# Patient Record
Sex: Female | Born: 1955 | Race: White | Hispanic: No | Marital: Married | Smoking: Never smoker
Health system: Southern US, Community
[De-identification: ages and names within clinical notes are randomized; demographics above are authoritative.]

## PROBLEM LIST (undated history)

## (undated) DIAGNOSIS — N83201 Unspecified ovarian cyst, right side: Secondary | ICD-10-CM

## (undated) DIAGNOSIS — R079 Chest pain, unspecified: Secondary | ICD-10-CM

## (undated) DIAGNOSIS — T7840XA Allergy, unspecified, initial encounter: Secondary | ICD-10-CM

## (undated) DIAGNOSIS — Z8489 Family history of other specified conditions: Secondary | ICD-10-CM

## (undated) DIAGNOSIS — E785 Hyperlipidemia, unspecified: Secondary | ICD-10-CM

## (undated) DIAGNOSIS — Z973 Presence of spectacles and contact lenses: Secondary | ICD-10-CM

## (undated) DIAGNOSIS — S83249A Other tear of medial meniscus, current injury, unspecified knee, initial encounter: Secondary | ICD-10-CM

## (undated) DIAGNOSIS — K430 Incisional hernia with obstruction, without gangrene: Secondary | ICD-10-CM

## (undated) DIAGNOSIS — R931 Abnormal findings on diagnostic imaging of heart and coronary circulation: Secondary | ICD-10-CM

## (undated) DIAGNOSIS — G4730 Sleep apnea, unspecified: Secondary | ICD-10-CM

## (undated) DIAGNOSIS — F419 Anxiety disorder, unspecified: Secondary | ICD-10-CM

## (undated) DIAGNOSIS — K5730 Diverticulosis of large intestine without perforation or abscess without bleeding: Secondary | ICD-10-CM

## (undated) DIAGNOSIS — Z79899 Other long term (current) drug therapy: Secondary | ICD-10-CM

## (undated) DIAGNOSIS — G4733 Obstructive sleep apnea (adult) (pediatric): Secondary | ICD-10-CM

## (undated) DIAGNOSIS — N6099 Unspecified benign mammary dysplasia of unspecified breast: Secondary | ICD-10-CM

## (undated) DIAGNOSIS — K219 Gastro-esophageal reflux disease without esophagitis: Secondary | ICD-10-CM

## (undated) DIAGNOSIS — I10 Essential (primary) hypertension: Secondary | ICD-10-CM

## (undated) DIAGNOSIS — IMO0002 Reserved for concepts with insufficient information to code with codable children: Secondary | ICD-10-CM

## (undated) DIAGNOSIS — E669 Obesity, unspecified: Secondary | ICD-10-CM

## (undated) DIAGNOSIS — Z9989 Dependence on other enabling machines and devices: Secondary | ICD-10-CM

## (undated) DIAGNOSIS — R0609 Other forms of dyspnea: Secondary | ICD-10-CM

## (undated) DIAGNOSIS — M1990 Unspecified osteoarthritis, unspecified site: Secondary | ICD-10-CM

## (undated) DIAGNOSIS — H9190 Unspecified hearing loss, unspecified ear: Secondary | ICD-10-CM

## (undated) DIAGNOSIS — N6092 Unspecified benign mammary dysplasia of left breast: Secondary | ICD-10-CM

## (undated) DIAGNOSIS — M109 Gout, unspecified: Secondary | ICD-10-CM

## (undated) DIAGNOSIS — R011 Cardiac murmur, unspecified: Secondary | ICD-10-CM

## (undated) DIAGNOSIS — K589 Irritable bowel syndrome without diarrhea: Secondary | ICD-10-CM

## (undated) DIAGNOSIS — E119 Type 2 diabetes mellitus without complications: Secondary | ICD-10-CM

## (undated) DIAGNOSIS — Z9889 Other specified postprocedural states: Secondary | ICD-10-CM

## (undated) DIAGNOSIS — R112 Nausea with vomiting, unspecified: Secondary | ICD-10-CM

## (undated) HISTORY — PX: TRIGGER FINGER RELEASE: SHX641

## (undated) HISTORY — PX: POSTERIOR LUMBAR FUSION: SHX6036

## (undated) HISTORY — DX: Other forms of dyspnea: R06.09

## (undated) HISTORY — DX: Essential (primary) hypertension: I10

## (undated) HISTORY — PX: TONSILLECTOMY: SUR1361

## (undated) HISTORY — DX: Other long term (current) drug therapy: Z79.899

## (undated) HISTORY — DX: Chest pain, unspecified: R07.9

## (undated) HISTORY — DX: Incisional hernia with obstruction, without gangrene: K43.0

## (undated) HISTORY — DX: Unspecified benign mammary dysplasia of left breast: N60.92

## (undated) HISTORY — DX: Hyperlipidemia, unspecified: E78.5

## (undated) HISTORY — DX: Obesity, unspecified: E66.9

## (undated) HISTORY — DX: Unspecified benign mammary dysplasia of unspecified breast: N60.99

## (undated) HISTORY — PX: WISDOM TOOTH EXTRACTION: SHX21

## (undated) HISTORY — DX: Reserved for concepts with insufficient information to code with codable children: IMO0002

## (undated) HISTORY — DX: Unspecified ovarian cyst, right side: N83.201

## (undated) HISTORY — PX: CERVICAL LAMINECTOMY: SHX94

## (undated) HISTORY — DX: Diverticulosis of large intestine without perforation or abscess without bleeding: K57.30

## (undated) HISTORY — DX: Abnormal findings on diagnostic imaging of heart and coronary circulation: R93.1

## (undated) HISTORY — DX: Cardiac murmur, unspecified: R01.1

## (undated) HISTORY — DX: Unspecified hearing loss, unspecified ear: H91.90

## (undated) HISTORY — PX: JOINT REPLACEMENT: SHX530

## (undated) HISTORY — PX: BREAST SURGERY: SHX581

## (undated) HISTORY — PX: ANTERIOR LUMBAR FUSION: SHX1170

## (undated) HISTORY — DX: Dependence on other enabling machines and devices: Z99.89

## (undated) HISTORY — DX: Allergy, unspecified, initial encounter: T78.40XA

## (undated) HISTORY — DX: Irritable bowel syndrome, unspecified: K58.9

## (undated) HISTORY — DX: Obstructive sleep apnea (adult) (pediatric): G47.33

## (undated) HISTORY — DX: Sleep apnea, unspecified: G47.30

## (undated) HISTORY — PX: CYST REMOVAL TRUNK: SHX6283

## (undated) HISTORY — PX: KNEE SURGERY: SHX244

## (undated) HISTORY — PX: KNEE ARTHROSCOPY: SUR90

## (undated) HISTORY — PX: DILATION AND CURETTAGE OF UTERUS: SHX78

## (undated) HISTORY — DX: Other tear of medial meniscus, current injury, unspecified knee, initial encounter: S83.249A

## (undated) HISTORY — PX: SINUS IRRIGATION: SHX2411

## (undated) HISTORY — DX: Gout, unspecified: M10.9

## (undated) HISTORY — DX: Unspecified osteoarthritis, unspecified site: M19.90

---

## 1992-05-26 HISTORY — PX: ABDOMINAL HYSTERECTOMY: SHX81

## 1998-11-13 ENCOUNTER — Ambulatory Visit (HOSPITAL_BASED_OUTPATIENT_CLINIC_OR_DEPARTMENT_OTHER): Admission: RE | Admit: 1998-11-13 | Discharge: 1998-11-13 | Payer: Self-pay | Admitting: Orthopedic Surgery

## 1999-08-23 ENCOUNTER — Encounter: Payer: Self-pay | Admitting: Obstetrics and Gynecology

## 1999-08-23 ENCOUNTER — Encounter: Admission: RE | Admit: 1999-08-23 | Discharge: 1999-08-23 | Payer: Self-pay | Admitting: Obstetrics and Gynecology

## 2000-09-11 ENCOUNTER — Ambulatory Visit (HOSPITAL_BASED_OUTPATIENT_CLINIC_OR_DEPARTMENT_OTHER): Admission: RE | Admit: 2000-09-11 | Discharge: 2000-09-11 | Payer: Self-pay | Admitting: Orthopedic Surgery

## 2001-02-22 ENCOUNTER — Ambulatory Visit (HOSPITAL_COMMUNITY): Admission: RE | Admit: 2001-02-22 | Discharge: 2001-02-22 | Payer: Self-pay | Admitting: Pulmonary Disease

## 2004-04-19 ENCOUNTER — Ambulatory Visit: Payer: Self-pay | Admitting: Internal Medicine

## 2004-05-16 ENCOUNTER — Ambulatory Visit: Payer: Self-pay | Admitting: Internal Medicine

## 2004-05-16 ENCOUNTER — Encounter: Admission: RE | Admit: 2004-05-16 | Discharge: 2004-05-16 | Payer: Self-pay | Admitting: Internal Medicine

## 2004-06-10 ENCOUNTER — Encounter: Admission: RE | Admit: 2004-06-10 | Discharge: 2004-06-10 | Payer: Self-pay | Admitting: Otolaryngology

## 2004-07-10 ENCOUNTER — Ambulatory Visit: Payer: Self-pay | Admitting: Internal Medicine

## 2004-07-17 ENCOUNTER — Ambulatory Visit: Payer: Self-pay | Admitting: Internal Medicine

## 2004-11-07 ENCOUNTER — Ambulatory Visit: Payer: Self-pay | Admitting: Internal Medicine

## 2004-11-14 ENCOUNTER — Ambulatory Visit: Payer: Self-pay | Admitting: Internal Medicine

## 2005-02-10 ENCOUNTER — Ambulatory Visit: Payer: Self-pay | Admitting: Internal Medicine

## 2005-03-31 ENCOUNTER — Ambulatory Visit: Payer: Self-pay | Admitting: Internal Medicine

## 2005-06-09 ENCOUNTER — Ambulatory Visit: Payer: Self-pay | Admitting: Internal Medicine

## 2005-06-16 ENCOUNTER — Ambulatory Visit: Payer: Self-pay | Admitting: Internal Medicine

## 2005-08-04 ENCOUNTER — Ambulatory Visit: Payer: Self-pay | Admitting: Internal Medicine

## 2005-08-08 ENCOUNTER — Ambulatory Visit: Payer: Self-pay | Admitting: Internal Medicine

## 2006-03-11 ENCOUNTER — Ambulatory Visit: Payer: Self-pay | Admitting: Internal Medicine

## 2006-06-15 ENCOUNTER — Ambulatory Visit: Payer: Self-pay | Admitting: Internal Medicine

## 2006-06-15 LAB — CONVERTED CEMR LAB
ALT: 17 units/L (ref 0–40)
AST: 19 units/L (ref 0–37)
Albumin: 3.6 g/dL (ref 3.5–5.2)
Alkaline Phosphatase: 44 units/L (ref 39–117)
BUN: 6 mg/dL (ref 6–23)
Basophils Absolute: 0 10*3/uL (ref 0.0–0.1)
Basophils Relative: 0.3 % (ref 0.0–1.0)
CO2: 28 meq/L (ref 19–32)
Calcium: 9.1 mg/dL (ref 8.4–10.5)
Chloride: 108 meq/L (ref 96–112)
Cholesterol: 152 mg/dL (ref 0–200)
Creatinine, Ser: 0.7 mg/dL (ref 0.4–1.2)
Eosinophils Relative: 5.5 % — ABNORMAL HIGH (ref 0.0–5.0)
GFR calc Af Amer: 114 mL/min
GFR calc non Af Amer: 94 mL/min
Glucose, Bld: 89 mg/dL (ref 70–99)
HCT: 42 % (ref 36.0–46.0)
HDL: 39.3 mg/dL (ref >39.0)
Hemoglobin: 14.6 g/dL (ref 12.0–15.0)
LDL Cholesterol: 81 mg/dL (ref 0–99)
Lymphocytes Relative: 23.1 % (ref 12.0–46.0)
MCHC: 34.8 g/dL (ref 30.0–36.0)
MCV: 93.4 fL (ref 78.0–100.0)
Monocytes Absolute: 0.4 10*3/uL (ref 0.2–0.7)
Monocytes Relative: 6.3 % (ref 3.0–11.0)
Neutro Abs: 4.5 10*3/uL (ref 1.4–7.7)
Neutrophils Relative %: 64.8 % (ref 43.0–77.0)
Platelets: 205 10*3/uL (ref 150–400)
Potassium: 4.2 meq/L (ref 3.5–5.1)
RBC: 4.49 M/uL (ref 3.87–5.11)
RDW: 12.3 % (ref 11.5–14.6)
Sodium: 142 meq/L (ref 135–145)
TSH: 1.69 microintl units/mL (ref 0.35–5.50)
Total Bilirubin: 1.2 mg/dL (ref 0.3–1.2)
Total CHOL/HDL Ratio: 3.9
Total Protein: 6.2 g/dL (ref 6.0–8.3)
Triglycerides: 158 mg/dL — ABNORMAL HIGH (ref 0–149)
VLDL: 32 mg/dL (ref 0–40)
WBC: 6.9 10*3/uL (ref 4.5–10.5)

## 2006-07-13 ENCOUNTER — Ambulatory Visit: Payer: Self-pay | Admitting: Internal Medicine

## 2006-08-29 ENCOUNTER — Ambulatory Visit: Payer: Self-pay | Admitting: Internal Medicine

## 2006-09-15 ENCOUNTER — Ambulatory Visit: Payer: Self-pay | Admitting: Internal Medicine

## 2006-10-30 ENCOUNTER — Ambulatory Visit: Payer: Self-pay | Admitting: Internal Medicine

## 2006-11-25 ENCOUNTER — Ambulatory Visit: Payer: Self-pay | Admitting: Internal Medicine

## 2006-12-09 ENCOUNTER — Ambulatory Visit: Payer: Self-pay | Admitting: Internal Medicine

## 2006-12-09 LAB — CONVERTED CEMR LAB
ALT: 22 units/L (ref 0–35)
AST: 20 units/L (ref 0–37)
Albumin: 3.7 g/dL (ref 3.5–5.2)
Alkaline Phosphatase: 39 units/L (ref 39–117)
BUN: 7 mg/dL (ref 6–23)
Bilirubin, Direct: 0.1 mg/dL (ref 0.0–0.3)
CO2: 29 meq/L (ref 19–32)
Calcium: 8.9 mg/dL (ref 8.4–10.5)
Chloride: 107 meq/L (ref 96–112)
Cholesterol: 229 mg/dL (ref 0–200)
Creatinine, Ser: 0.6 mg/dL (ref 0.4–1.2)
Direct LDL: 172.3 mg/dL
GFR calc Af Amer: 136 mL/min
GFR calc non Af Amer: 112 mL/min
Glucose, Bld: 88 mg/dL (ref 70–99)
HDL: 36.5 mg/dL — ABNORMAL LOW (ref >39.0)
Potassium: 3.8 meq/L (ref 3.5–5.1)
Sodium: 141 meq/L (ref 135–145)
Total Bilirubin: 1.3 mg/dL — ABNORMAL HIGH (ref 0.3–1.2)
Total CHOL/HDL Ratio: 6.3
Total Protein: 6.2 g/dL (ref 6.0–8.3)
Triglycerides: 202 mg/dL (ref 0–149)
VLDL: 40 mg/dL (ref 0–40)

## 2006-12-10 ENCOUNTER — Encounter: Payer: Self-pay | Admitting: Internal Medicine

## 2006-12-10 ENCOUNTER — Ambulatory Visit: Payer: Self-pay | Admitting: Internal Medicine

## 2006-12-10 LAB — HM COLONOSCOPY: HM Colonoscopy: NORMAL

## 2006-12-21 ENCOUNTER — Ambulatory Visit: Payer: Self-pay | Admitting: Internal Medicine

## 2006-12-21 DIAGNOSIS — K5730 Diverticulosis of large intestine without perforation or abscess without bleeding: Secondary | ICD-10-CM | POA: Insufficient documentation

## 2006-12-21 DIAGNOSIS — E785 Hyperlipidemia, unspecified: Secondary | ICD-10-CM

## 2006-12-21 DIAGNOSIS — I10 Essential (primary) hypertension: Secondary | ICD-10-CM

## 2006-12-21 HISTORY — DX: Essential (primary) hypertension: I10

## 2006-12-21 HISTORY — DX: Diverticulosis of large intestine without perforation or abscess without bleeding: K57.30

## 2006-12-21 HISTORY — DX: Hyperlipidemia, unspecified: E78.5

## 2006-12-21 LAB — CONVERTED CEMR LAB
Cholesterol, target level: 200 mg/dL
HDL goal, serum: 40 mg/dL
LDL Goal: 130 mg/dL

## 2006-12-29 ENCOUNTER — Telehealth (INDEPENDENT_AMBULATORY_CARE_PROVIDER_SITE_OTHER): Payer: Self-pay | Admitting: *Deleted

## 2007-01-12 ENCOUNTER — Telehealth: Payer: Self-pay | Admitting: Internal Medicine

## 2007-02-02 ENCOUNTER — Ambulatory Visit: Payer: Self-pay | Admitting: Internal Medicine

## 2007-02-08 ENCOUNTER — Telehealth: Payer: Self-pay | Admitting: Internal Medicine

## 2007-03-17 ENCOUNTER — Ambulatory Visit: Payer: Self-pay | Admitting: Internal Medicine

## 2007-04-09 ENCOUNTER — Telehealth: Payer: Self-pay | Admitting: Internal Medicine

## 2007-04-14 ENCOUNTER — Telehealth: Payer: Self-pay | Admitting: Internal Medicine

## 2007-04-15 ENCOUNTER — Telehealth: Payer: Self-pay | Admitting: Internal Medicine

## 2007-04-21 ENCOUNTER — Ambulatory Visit: Payer: Self-pay | Admitting: Internal Medicine

## 2007-07-21 ENCOUNTER — Ambulatory Visit: Payer: Self-pay | Admitting: Internal Medicine

## 2007-09-23 ENCOUNTER — Encounter: Payer: Self-pay | Admitting: Internal Medicine

## 2007-11-23 ENCOUNTER — Ambulatory Visit: Payer: Self-pay | Admitting: Internal Medicine

## 2008-02-23 ENCOUNTER — Other Ambulatory Visit: Admission: RE | Admit: 2008-02-23 | Discharge: 2008-02-23 | Payer: Self-pay | Admitting: Obstetrics and Gynecology

## 2008-04-17 ENCOUNTER — Telehealth: Payer: Self-pay | Admitting: Internal Medicine

## 2008-05-26 LAB — CONVERTED CEMR LAB

## 2008-06-08 ENCOUNTER — Ambulatory Visit: Payer: Self-pay | Admitting: Internal Medicine

## 2008-06-09 LAB — CONVERTED CEMR LAB
BUN: 9 mg/dL (ref 6–23)
CO2: 29 meq/L (ref 19–32)
Calcium: 9.3 mg/dL (ref 8.4–10.5)
GFR calc Af Amer: 135 mL/min
Glucose, Bld: 88 mg/dL (ref 70–99)
HDL: 40.2 mg/dL (ref >39.0)
Triglycerides: 158 mg/dL — ABNORMAL HIGH (ref 0–149)

## 2008-06-23 ENCOUNTER — Encounter: Payer: Self-pay | Admitting: Internal Medicine

## 2008-07-25 ENCOUNTER — Ambulatory Visit: Payer: Self-pay | Admitting: Internal Medicine

## 2008-07-25 LAB — CONVERTED CEMR LAB
Bilirubin, Direct: 0.2 mg/dL (ref 0.0–0.3)
HDL: 32 mg/dL — ABNORMAL LOW (ref >39.0)
Total Bilirubin: 1.3 mg/dL — ABNORMAL HIGH (ref 0.3–1.2)
Total CHOL/HDL Ratio: 6.1
VLDL: 25 mg/dL (ref 0–40)

## 2008-07-31 ENCOUNTER — Ambulatory Visit: Payer: Self-pay | Admitting: Internal Medicine

## 2008-07-31 ENCOUNTER — Telehealth: Payer: Self-pay | Admitting: Internal Medicine

## 2008-08-15 ENCOUNTER — Encounter: Payer: Self-pay | Admitting: Internal Medicine

## 2008-08-15 ENCOUNTER — Ambulatory Visit: Payer: Self-pay

## 2008-10-26 ENCOUNTER — Telehealth: Payer: Self-pay | Admitting: Internal Medicine

## 2009-01-30 ENCOUNTER — Ambulatory Visit: Payer: Self-pay | Admitting: Internal Medicine

## 2009-01-31 LAB — CONVERTED CEMR LAB
Alkaline Phosphatase: 39 units/L (ref 39–117)
CO2: 28 meq/L (ref 19–32)
Chloride: 107 meq/L (ref 96–112)
Cholesterol: 176 mg/dL (ref 0–200)
Creatinine, Ser: 0.7 mg/dL (ref 0.4–1.2)
HDL: 38.1 mg/dL — ABNORMAL LOW (ref >39.00)
LDL Cholesterol: 116 mg/dL — ABNORMAL HIGH (ref 0–99)
Potassium: 4.7 meq/L (ref 3.5–5.1)
Sodium: 141 meq/L (ref 135–145)
TSH: 1.55 microintl units/mL (ref 0.35–5.50)
Total CHOL/HDL Ratio: 5
Triglycerides: 112 mg/dL (ref 0.0–149.0)
VLDL: 22.4 mg/dL (ref 0.0–40.0)

## 2009-03-08 ENCOUNTER — Encounter: Payer: Self-pay | Admitting: Internal Medicine

## 2009-04-26 ENCOUNTER — Encounter: Payer: Self-pay | Admitting: Internal Medicine

## 2009-04-26 ENCOUNTER — Ambulatory Visit: Payer: Self-pay | Admitting: Internal Medicine

## 2009-05-21 ENCOUNTER — Telehealth: Payer: Self-pay | Admitting: Speech Pathology

## 2009-05-24 ENCOUNTER — Telehealth: Payer: Self-pay | Admitting: Internal Medicine

## 2009-05-26 HISTORY — PX: BACK SURGERY: SHX140

## 2009-05-28 ENCOUNTER — Encounter: Payer: Self-pay | Admitting: Internal Medicine

## 2009-05-31 ENCOUNTER — Encounter: Admission: RE | Admit: 2009-05-31 | Discharge: 2009-05-31 | Payer: Self-pay | Admitting: Neurosurgery

## 2009-06-15 ENCOUNTER — Encounter: Admission: RE | Admit: 2009-06-15 | Discharge: 2009-06-15 | Payer: Self-pay | Admitting: Neurosurgery

## 2009-07-27 ENCOUNTER — Ambulatory Visit: Payer: Self-pay | Admitting: Internal Medicine

## 2009-07-27 LAB — CONVERTED CEMR LAB
AST: 15 units/L (ref 0–37)
Albumin: 3.5 g/dL (ref 3.5–5.2)
BUN: 10 mg/dL (ref 6–23)
Basophils Relative: 0.1 % (ref 0.0–3.0)
Bilirubin, Direct: 0.1 mg/dL (ref 0.0–0.3)
Blood in Urine, dipstick: NEGATIVE
CO2: 30 meq/L (ref 19–32)
Chloride: 105 meq/L (ref 96–112)
Cholesterol: 208 mg/dL — ABNORMAL HIGH (ref 0–200)
Creatinine, Ser: 0.7 mg/dL (ref 0.4–1.2)
Direct LDL: 141.2 mg/dL
Eosinophils Absolute: 0.1 10*3/uL (ref 0.0–0.7)
Glucose, Bld: 89 mg/dL (ref 70–99)
Ketones, urine, test strip: NEGATIVE
MCHC: 34 g/dL (ref 30.0–36.0)
MCV: 100 fL (ref 78.0–100.0)
Monocytes Absolute: 0.5 10*3/uL (ref 0.1–1.0)
Neutrophils Relative %: 73 % (ref 43.0–77.0)
Nitrite: NEGATIVE
Platelets: 181 10*3/uL (ref 150.0–400.0)
Protein, U semiquant: NEGATIVE
RBC: 4.09 M/uL (ref 3.87–5.11)
TSH: 1.43 microintl units/mL (ref 0.35–5.50)
Urobilinogen, UA: 0.2
WBC Urine, dipstick: NEGATIVE

## 2009-07-31 ENCOUNTER — Encounter: Payer: Self-pay | Admitting: Internal Medicine

## 2009-08-02 ENCOUNTER — Encounter: Payer: Self-pay | Admitting: Internal Medicine

## 2009-08-03 ENCOUNTER — Ambulatory Visit (HOSPITAL_COMMUNITY): Admission: RE | Admit: 2009-08-03 | Discharge: 2009-08-03 | Payer: Self-pay | Admitting: Neurosurgery

## 2009-08-08 ENCOUNTER — Ambulatory Visit (HOSPITAL_COMMUNITY): Admission: RE | Admit: 2009-08-08 | Discharge: 2009-08-09 | Payer: Self-pay | Admitting: Neurosurgery

## 2009-08-14 ENCOUNTER — Ambulatory Visit: Payer: Self-pay | Admitting: Family Medicine

## 2009-09-03 ENCOUNTER — Telehealth: Payer: Self-pay | Admitting: Internal Medicine

## 2009-10-15 ENCOUNTER — Ambulatory Visit: Payer: Self-pay | Admitting: Internal Medicine

## 2009-10-15 DIAGNOSIS — M19049 Primary osteoarthritis, unspecified hand: Secondary | ICD-10-CM | POA: Insufficient documentation

## 2009-10-15 LAB — CONVERTED CEMR LAB: Sed Rate: 7 mm/hr (ref 0–22)

## 2009-10-25 LAB — CONVERTED CEMR LAB: Anti Nuclear Antibody(ANA): NEGATIVE

## 2009-11-08 ENCOUNTER — Ambulatory Visit: Payer: Self-pay | Admitting: Internal Medicine

## 2009-12-20 ENCOUNTER — Ambulatory Visit: Payer: Self-pay | Admitting: Internal Medicine

## 2010-03-18 ENCOUNTER — Encounter: Payer: Self-pay | Admitting: Internal Medicine

## 2010-04-15 ENCOUNTER — Ambulatory Visit: Payer: Self-pay | Admitting: Internal Medicine

## 2010-04-15 DIAGNOSIS — R0609 Other forms of dyspnea: Secondary | ICD-10-CM | POA: Insufficient documentation

## 2010-04-15 DIAGNOSIS — R0989 Other specified symptoms and signs involving the circulatory and respiratory systems: Secondary | ICD-10-CM

## 2010-04-15 LAB — CONVERTED CEMR LAB
AntiThromb III Func: 134 % — ABNORMAL HIGH (ref 76–126)
Anticardiolipin IgA: 5 (ref <22)
Anticardiolipin IgM: 5 (ref <11)
Homocysteine: 7.3 micromoles/L (ref 4.0–15.4)
Protein C Activity: 181 % — ABNORMAL HIGH (ref 75–133)

## 2010-04-16 ENCOUNTER — Encounter: Payer: Self-pay | Admitting: Internal Medicine

## 2010-04-23 ENCOUNTER — Encounter: Payer: Self-pay | Admitting: Internal Medicine

## 2010-04-24 ENCOUNTER — Telehealth: Payer: Self-pay | Admitting: *Deleted

## 2010-04-29 ENCOUNTER — Telehealth (INDEPENDENT_AMBULATORY_CARE_PROVIDER_SITE_OTHER): Payer: Self-pay | Admitting: Radiology

## 2010-05-02 ENCOUNTER — Telehealth (INDEPENDENT_AMBULATORY_CARE_PROVIDER_SITE_OTHER): Payer: Self-pay

## 2010-05-06 ENCOUNTER — Encounter (INDEPENDENT_AMBULATORY_CARE_PROVIDER_SITE_OTHER): Payer: Self-pay | Admitting: *Deleted

## 2010-05-06 ENCOUNTER — Encounter: Payer: Self-pay | Admitting: Cardiology

## 2010-05-06 ENCOUNTER — Encounter (HOSPITAL_COMMUNITY): Admission: RE | Admit: 2010-05-06 | Payer: Self-pay | Source: Home / Self Care | Admitting: Internal Medicine

## 2010-05-06 ENCOUNTER — Ambulatory Visit: Payer: Self-pay

## 2010-05-06 ENCOUNTER — Encounter (HOSPITAL_COMMUNITY)
Admission: RE | Admit: 2010-05-06 | Discharge: 2010-06-25 | Payer: Self-pay | Source: Home / Self Care | Attending: Internal Medicine | Admitting: Internal Medicine

## 2010-05-30 ENCOUNTER — Encounter: Payer: Self-pay | Admitting: Internal Medicine

## 2010-06-03 ENCOUNTER — Encounter: Payer: Self-pay | Admitting: Internal Medicine

## 2010-06-06 ENCOUNTER — Telehealth: Payer: Self-pay | Admitting: Internal Medicine

## 2010-06-27 NOTE — Assessment & Plan Note (Signed)
Summary: 6 month fup//ccm   Vital Signs:  Patient profile:   55 year old female Weight:      200 pounds Temp:     98.7 degrees F oral Pulse rate:   68 / minute Pulse rhythm:   regular BP sitting:   124 / 88  (left arm) Cuff size:   large  Vitals Entered By: Alfred Levins, CMA (April 15, 2010 8:21 AM) CC: bp check   CC:  bp check.  History of Present Illness:  Follow-Up Visit      This is a 55 year old woman who presents for Follow-up visit. only new c/o is head congestion and right ear pain. Also has seen rheumatology at [facility]  The patient denies chest pain and palpitations.  The patient reports taking meds as prescribed.  When questioned about possible medication side effects, the patient notes none.    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 As Needed 2)  Nasonex 50 Mcg/act  Susp (Mometasone Furoate) .... As Needed 3)  Allegra 180 Mg  Tabs (Fexofenadine Hcl) .... As Needed 4)  Bystolic 10 Mg Tabs (Nebivolol Hcl) .... Take 1 Tablet By Mouth Once A Day As Directed 5)  Hydrochlorothiazide 25 Mg Tabs (Hydrochlorothiazide) .... Take 1 Tablet By Mouth Once A Day As Directed 6)  Vitamin D (Ergocalciferol) 50000 Unit Caps (Ergocalciferol) .Marland Kitchen.. 1 Q Week 7)  Diazepam 5 Mg Tabs (Diazepam) .... One Twice Daily As Needed 8)  Meloxicam 7.5 Mg  Tabs (Meloxicam) .... One By Mouth Daily With Food  Allergies: 1)  ! Maxzide-25 (Triamterene-Hctz) 2)  ! Lisinopril 3)  ! Norvasc 4)  ! Demerol 5)  Sulfamethoxazole (Sulfamethoxazole) 6)  Penicillin G Potassium (Penicillin G Potassium) 7)  Simvastatin  Past History:  Past Medical History: Last updated: 12/21/2006 Allergic  rhinitis Diverticulosis, colon Hyperlipidemia Hypertension  Past Surgical History: Last updated: 10/15/2009 Cervical laminectomy Hysterectomy Sinus surgery trigger finger ganglion cyst R knee arthroscop back surgery to remove cyst--March 2011(botero)  Family History: Last updated: 04/15/2010 Family History of CAD Female 1st degree relative <60 mother Family History of CAD Female 1st degree relative <50 father Family History of Colon CA 1st degree relative <60-mother (secondary cancer---could be radiation induced from original) brother CABG at 54 yo. daughter- with lupus anticoagulant, Rheum Arthritis mother with DVT brother with pulmonary emobolus deceased 32.   Social History: Last updated: 12/21/2006 Occupation: Married Never Smoked Regular exercise-yes  Risk Factors: Exercise: yes (12/21/2006)  Risk Factors: Smoking Status: never (12/20/2009)  Family History: Family History of CAD Female 1st degree relative <60 mother Family History of CAD Female 1st degree relative <50 father Family History of Colon CA 1st degree relative <60-mother (secondary cancer---could be radiation induced from original) brother CABG at 79 yo. daughter- with lupus anticoagulant, Rheum Arthritis mother with DVT brother with pulmonary emobolus deceased 34.   Physical Exam  General:  overweight female in no acute distress. HEENT exam atraumatic, normocephalic symmetric her muscles are intact. Neck is supple. Chest clear to auscultation cardiac exam S1-S2 are regular, 2/6 HSM. Abdominal exam overweight white female sounds, soft extremities no clubbing cyanosis or edema.   Impression & Recommendations:  Problem # 1:  HYPERLIPIDEMIA (ICD-272.4) unable to  tolerate statin she needs to lose weight discussed need for aggressive weight loss The following medications were removed from the medication list:    Simvastatin 20 Mg Tabs (Simvastatin) .Marland Kitchen... Take one tablet by mouth every 3rd day  Labs  Reviewed: SGOT: 15 (07/27/2009)   SGPT: 24 (07/27/2009)  Lipid Goals: Chol Goal: 200 (12/21/2006)   HDL Goal: 40 (12/21/2006)   LDL Goal: 130 (12/21/2006)   TG Goal: 150 (12/21/2006)  Prior 10 Yr Risk Heart Disease: Not enough information (12/21/2006)   HDL:51.30 (07/27/2009), 38.10 (01/30/2009)  LDL:116 (01/30/2009), 139 (07/25/2008)  Chol:208 (07/27/2009), 176 (01/30/2009)  Trig:137.0 (07/27/2009), 112.0 (01/30/2009)  Problem # 2:  ESSENTIAL HYPERTENSION (ICD-401.9) controlled continue current medications  Her updated medication list for this problem includes:    Bystolic 10 Mg Tabs (Nebivolol hcl) .Marland Kitchen... Take 1 tablet by mouth once a day as directed    Hydrochlorothiazide 25 Mg Tabs (Hydrochlorothiazide) .Marland Kitchen... Take 1 tablet by mouth once a day as directed  BP today: 124/88 Prior BP: 150/90 (12/20/2009)  Prior 10 Yr Risk Heart Disease: Not enough information (12/21/2006)  Labs Reviewed: K+: 4.5 (07/27/2009) Creat: : 0.7 (07/27/2009)   Chol: 208 (07/27/2009)   HDL: 51.30 (07/27/2009)   LDL: 116 (01/30/2009)   TG: 137.0 (07/27/2009)  Problem # 3:  OSTEOARTHRITIS, HAND (ICD-715.94)  has seen rheumatology no autoimmune cause of sxs identrified thus far.  Her updated medication list for this problem includes:    Meloxicam 7.5 Mg Tabs (Meloxicam) ..... One by mouth daily with food  Discussed use of medications, application of heat or cold, and exercises.   Problem # 4:  DYSPNEA/SHORTNESS OF BREATH (ICD-786.09) will get last stress test, may need myoview Her updated medication list for this problem includes:    Bystolic 10 Mg Tabs (Nebivolol hcl) .Marland Kitchen... Take 1 tablet by mouth once a day as directed    Hydrochlorothiazide 25 Mg Tabs (Hydrochlorothiazide) .Marland Kitchen... Take 1 tablet by mouth once a day as directed  Problem # 5:  FAMILY HISTORY OF OTHER BLOOD DISORDERS (ICD-V18.3)  Orders: T-Hypercoagulation Profile [id number])  Complete Medication List: 1)  Meclizine Hcl 25 Mg  Tabs (Meclizine hcl) .... As needed 2)  Nasonex 50 Mcg/act Susp (Mometasone furoate) .... As needed 3)  Allegra 180 Mg Tabs (Fexofenadine hcl) .... As needed 4)  Bystolic 10 Mg Tabs (Nebivolol hcl) .... Take 1 tablet by mouth once a day as directed 5)  Hydrochlorothiazide 25 Mg Tabs (Hydrochlorothiazide) .... Take 1 tablet by mouth once a day as directed 6)  Vitamin D (ergocalciferol) 50000 Unit Caps (Ergocalciferol) .Marland Kitchen.. 1 q week 7)  Diazepam 5 Mg Tabs (Diazepam) .... One twice daily as needed 8)  Meloxicam 7.5 Mg Tabs (Meloxicam) .... One by mouth daily with food   Orders Added: 1)  T-Hypercoagulation Profile [[id number]] 2)  Est. Patient Level IV [10932]

## 2010-06-27 NOTE — Progress Notes (Signed)
  Phone Note Call from Patient Call back at Home Phone [phone]   Caller: Patient Call For: Birdie Sons MD Summary of Call: Pt had a sinus infection x 3 weeks, and went to Urgent Care and took a Zpack.  She was better, but now it has worsened and she wants Dr. Cato Mulligan to call in a different antibiotic. [facility]. Initial call taken by: Lynann Beaver CMA AAMA,  June 06, 2010 12:44 PM  Follow-up for Phone Call        doxycycline 100 mg by mouth two times a day for 10 days Follow-up by: Birdie Sons MD,  June 07, 2010 1:41 PM    New/Updated Medications: DOXYCYCLINE HYCLATE 100 MG CAPS (DOXYCYCLINE HYCLATE) one by mouth two times a day x 10 days Prescriptions: DOXYCYCLINE HYCLATE 100 MG CAPS (DOXYCYCLINE HYCLATE) one by mouth two times a day x 10 days  #20 x 0   Entered by:   Lynann Beaver CMA AAMA   Authorized by:   Birdie Sons MD   Signed by:   Lynann Beaver CMA AAMA on 06/07/2010   Method used:   Electronically to        [facility] (retail)       [address]       [place], [place]  [address]       Ph: [phone]       Fax: [phone]   RxID:   [id number]  Notifed pt.

## 2010-06-27 NOTE — Progress Notes (Signed)
Summary: Nuc Pre-Procedure  Phone Note Outgoing Call   Call placed by: Leonia Corona, RT-N,  April 29, 2010 11:10 AM Call placed to: Patient Reason for Call: Confirm/change Appt Summary of Call: Left message with information on Myoview Information Sheet (see scanned document for details).      Nuclear Med Background Indications for Stress Test: Evaluation for Ischemia        Nuclear Pre-Procedure Cardiac Risk Factors: Family History - CAD, Hypertension, Lipids Height (in): 64.4

## 2010-06-27 NOTE — Letter (Signed)
Summary: [facility]  [facility]   Imported By: Maryln Gottron 06/11/2010 15:47:03  _____________________________________________________________________  External Attachment:    Type:   Image     Comment:   External Document

## 2010-06-27 NOTE — Consult Note (Signed)
Summary: [facility]-Rheumatology  [facility]   Imported By: Maryln Gottron 04/05/2010 15:04:18  _____________________________________________________________________  External Attachment:    Type:   Image     Comment:   External Document

## 2010-06-27 NOTE — Assessment & Plan Note (Signed)
Summary: cpx/no pap/njr rsc bmp/njr pt rsc/njr/pt rsc from bmp/cjr/pt ...   Vital Signs:  Patient profile:   55 year old female Height:      64.4 inches Weight:      198 pounds BMI:     33.69 Pulse rate:   64 / minute Pulse rhythm:   regular Resp:     14 per minute BP sitting:   150 / 92  (left arm) Cuff size:   regular  Vitals Entered By: Gladis Riffle, RN (Oct 15, 2009 9:27 AM) CC: cpx, labs done in March--has gyn--had back surgery 07/2009--discuss nabumetone Is Patient Diabetic? No   CC:  cpx and labs done in March--has gyn--had back surgery 07/2009--discuss nabumetone.  History of Present Illness: cpx  she has new complaints about arthritis in hands--- Fhx of RA---she is concerned hands tend to be somewhat painful in the a.m. and when she tries to grab something. Has noted some nodules at DIPjoints  Preventive Screening-Counseling & Management  Alcohol-Tobacco     Smoking Status: never  Allergies: 1)  ! Maxzide-25 (Triamterene-Hctz) 2)  ! Lisinopril 3)  ! Norvasc 4)  ! Demerol 5)  Sulfamethoxazole (Sulfamethoxazole) 6)  Penicillin G Potassium (Penicillin G Potassium)  Past History:  Past Surgical History: Cervical laminectomy Hysterectomy Sinus surgery trigger finger ganglion cyst R knee arthroscop back surgery to remove cyst--March 2011(botero)  Physical Exam  General:  alert and well-developed.   Head:  normocephalic and atraumatic.   Eyes:  pupils equal and pupils round.   Ears:  R ear normal and L ear normal.   Neck:  No deformities, masses, or tenderness noted. Chest Wall:  No deformities, masses, or tenderness noted. Lungs:  normal respiratory effort, no intercostal retractions, and no accessory muscle use.   Heart:  normal rate and regular rhythm.   Abdomen:  soft and non-tender.   Msk:  lower lumbar area reveals vertical incision which is healing well. No erythema or swelling or drainage.  heberden's , bouchard's note Pulses:  R radial normal  and L radial normal.   Neurologic:  cranial nerves II-XII intact and gait normal.   Skin:  turgor normal and color normal.   Psych:  good eye contact and not anxious appearing.     Impression & Recommendations:  Problem # 1:  Preventive Health Care (ICD-V70.0) health maint UTD  Problem # 2:  HYPERLIPIDEMIA (ICD-272.4) she was not taking simvastatin very regularly at time of labs she will resume Her updated medication list for this problem includes:    Simvastatin 20 Mg Tabs (Simvastatin) .Marland Kitchen... Take one tablet by mouth every other day  Labs Reviewed: SGOT: 15 (07/27/2009)   SGPT: 24 (07/27/2009)  Lipid Goals: Chol Goal: 200 (12/21/2006)   HDL Goal: 40 (12/21/2006)   LDL Goal: 130 (12/21/2006)   TG Goal: 150 (12/21/2006)  Prior 10 Yr Risk Heart Disease: Not enough information (12/21/2006)   HDL:51.30 (07/27/2009), 38.10 (01/30/2009)  LDL:116 (01/30/2009), 139 (07/25/2008)  Chol:208 (07/27/2009), 176 (01/30/2009)  Trig:137.0 (07/27/2009), 112.0 (01/30/2009)  Problem # 3:  OSTEOARTHRITIS, HAND (ICD-715.94)  discussed at length  will evaluate for other inflammatory causes  Orders: Venipuncture (84132) TLB-Uric Acid, Blood (84550-URIC) TLB-Sedimentation Rate (ESR) (85652-ESR) T-Antinuclear Antib (ANA) (44010-27253) TLB-Rheumatoid Factor (RA) (66440-HK)  Complete Medication List: 1)  Meclizine Hcl 25 Mg Tabs (Meclizine hcl) .... Prn 2)  Nasonex 50 Mcg/act Susp (Mometasone furoate) .... Prn 3)  Allegra 180 Mg Tabs (Fexofenadine hcl) .... Prn 4)  Bystolic 10 Mg Tabs (Nebivolol hcl) .Marland KitchenMarland KitchenMarland Kitchen  Take 1 tablet by mouth once a day as directed 5)  Hydrochlorothiazide 25 Mg Tabs (Hydrochlorothiazide) .... Take 1 tablet by mouth once a day as directed 6)  Simvastatin 20 Mg Tabs (Simvastatin) .... Take one tablet by mouth every other day 7)  Vitamin D (ergocalciferol) 50000 Unit Caps (Ergocalciferol) .Marland Kitchen.. 1 q week 8)  Diazepam 5 Mg Tabs (Diazepam) .... One twice daily as needed 9)  Diclofenac  Sodium 50 Mg Tbec (Diclofenac sodium) .Marland Kitchen.. 1 by mouth 2 times daily. take with food   Patient Instructions: 1)  Please schedule a follow-up appointment in 6 months. Prescriptions: DICLOFENAC SODIUM 50 MG TBEC (DICLOFENAC SODIUM) 1 by mouth 2 times daily. take with food  #60 x 3   Entered and Authorized by:   Birdie Sons MD   Signed by:   Birdie Sons MD on 10/15/2009   Method used:   Electronically to        [facility] (retail)       [address]       [place], [place]  [address]       Ph: [phone]       Fax: [phone]   RxID:   [id number]

## 2010-06-27 NOTE — Assessment & Plan Note (Signed)
Summary: 6 wk rov/njr   Vital Signs:  Patient profile:   55 year old female Height:      64.4 inches Weight:      199 pounds BMI:     33.86 Temp:     98.6 degrees F oral Pulse rate:   64 / minute BP sitting:   150 / 90  (left arm) Cuff size:   regular  Vitals Entered By: Kathrynn Speed CMA (December 20, 2009 8:46 AM)  Nutrition Counseling: Patient's BMI is greater than 25 and therefore counseled on weight management options. CC: 6 wkk rov on med, src   CC:  6 wkk rov on med and src.  History of Present Illness:  Follow-Up Visit      This is a 55 year old woman who presents for Follow-up visit.  The patient denies chest pain and palpitations.  Since the last visit the patient notes no new problems or concerns.  The patient reports taking meds as prescribed.  When questioned about possible medication side effects, the patient notes none.  continues to have foot pain when she first stands---pain is "all over feet". she also notes persistent hand pain with some tingling of hands---ongoing for years---sxs are better with meloxicam.  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 As Needed 2)  Nasonex 50 Mcg/act  Susp (Mometasone Furoate) .... As Needed 3)  Allegra 180 Mg  Tabs (Fexofenadine Hcl) .... As Needed 4)  Bystolic 10 Mg Tabs (Nebivolol Hcl) .... Take 1 Tablet By Mouth Once A Day As Directed 5)  Hydrochlorothiazide 25 Mg Tabs (Hydrochlorothiazide) .... Take 1 Tablet By Mouth Once A Day As Directed 6)  Simvastatin 20 Mg Tabs (Simvastatin) .... Take One Tablet By Mouth Every 3rd Day 7)   Vitamin D (Ergocalciferol) 50000 Unit Caps (Ergocalciferol) .Marland Kitchen.. 1 Q Week 8)  Diazepam 5 Mg Tabs (Diazepam) .... One Twice Daily As Needed 9)  Meloxicam 7.5 Mg  Tabs (Meloxicam) .... One By Mouth Daily With Food  Allergies (verified): 1)  ! Maxzide-25 (Triamterene-Hctz) 2)  ! Lisinopril 3)  ! Norvasc 4)  ! Demerol 5)  Sulfamethoxazole (Sulfamethoxazole) 6)  Penicillin G Potassium (Penicillin G Potassium)  Past History:  Past Medical History: Last updated: 12/21/2006 Allergic rhinitis Diverticulosis, colon Hyperlipidemia Hypertension  Past Surgical History: Last updated: 10/15/2009 Cervical laminectomy Hysterectomy Sinus surgery trigger finger ganglion cyst R knee arthroscop back surgery to remove cyst--March 2011(botero)  Family History: Last updated: 01/30/2009 Family History of CAD Female 1st degree relative <60 Family History of CAD Female 1st degree relative <50 Family History of Colon CA 1st degree relative <60-mother (secondary cancer---could be radiation induced from original) brother CABG at 26 yo. daughter- with lupus anticoagulant, Rheum Arthritis mother with DVT  Social History: Last updated: 12/21/2006 Occupation: Married Never Smoked Regular exercise-yes  Risk Factors: Exercise: yes (12/21/2006)  Risk Factors: Smoking Status: never (12/20/2009)  Physical Exam  General:  alert and well-developed.   Head:  normocephalic and atraumatic.   Eyes:  pupils equal and pupils round.   Ears:  R ear normal and L ear normal.   Neck:  No deformities, masses,  or tenderness noted. Chest Wall:  No deformities, masses, or tenderness noted. Lungs:  normal respiratory effort and no intercostal retractions.   Heart:  normal rate and regular rhythm.   Abdomen:  soft and non-tender.   Msk:  heberdens nodes on hands bilaterally Skin:  turgor normal and color normal.     Impression & Recommendations:  Problem # 1:  OSTEOARTHRITIS, HAND (ICD-715.94)  i don't  think she has a significant inflammatory arthritis but she has multiple arthralgias I'll ask rhemoatology to see her.   discussed possibility of CTS---will try wrist brace Her updated medication list for this problem includes:    Meloxicam 7.5 Mg Tabs (Meloxicam) ..... One by mouth daily with food  Orders: Rheumatology Referral (Rheumatology)  Problem # 2:  ESSENTIAL HYPERTENSION (ICD-401.9) will continue to monitor.  home bps 110-120/60s Her updated medication list for this problem includes:    Bystolic 10 Mg Tabs (Nebivolol hcl) .Marland Kitchen... Take 1 tablet by mouth once a day as directed    Hydrochlorothiazide 25 Mg Tabs (Hydrochlorothiazide) .Marland Kitchen... Take 1 tablet by mouth once a day as directed  BP today: 150/90 Prior BP: 138/90 (11/08/2009)  Prior 10 Yr Risk Heart Disease: Not enough information (12/21/2006)  Labs Reviewed: K+: 4.5 (07/27/2009) Creat: : 0.7 (07/27/2009)   Chol: 208 (07/27/2009)   HDL: 51.30 (07/27/2009)   LDL: 116 (01/30/2009)   TG: 137.0 (07/27/2009)  Complete Medication List: 1)  Meclizine Hcl 25 Mg Tabs (Meclizine hcl) .... As needed 2)  Nasonex 50 Mcg/act Susp (Mometasone furoate) .... As needed 3)  Allegra 180 Mg Tabs (Fexofenadine hcl) .... As needed 4)  Bystolic 10 Mg Tabs (Nebivolol hcl) .... Take 1 tablet by mouth once a day as directed 5)  Hydrochlorothiazide 25 Mg Tabs (Hydrochlorothiazide) .... Take 1 tablet by mouth once a day as directed 6)  Simvastatin 20 Mg Tabs (Simvastatin) .... Take one tablet by mouth every 3rd day 7)  Vitamin D (ergocalciferol) 50000 Unit Caps (Ergocalciferol) .Marland Kitchen.. 1 q week 8)  Diazepam 5 Mg Tabs (Diazepam) .... One twice daily as needed 9)  Meloxicam 7.5 Mg Tabs (Meloxicam) .... One by mouth daily with food  Patient Instructions: 1)  Please schedule a follow-up appointment in 3 months. Prescriptions: MELOXICAM 7.5 MG  TABS (MELOXICAM) one by mouth daily with food  #30 x 3   Entered and Authorized by:   Birdie Sons MD    Signed by:   Birdie Sons MD on 12/20/2009   Method used:   Electronically to        [facility] (retail)       [address]       [place], [place]  [address]       Ph: [phone]       Fax: [phone]   RxID:   [id number]

## 2010-06-27 NOTE — Medication Information (Signed)
Summary: Denial of Coverage for Fexofenadine  Denial of Coverage for Fexofenadine   Imported By: Maryln Gottron 08/03/2009 12:18:59  _____________________________________________________________________  External Attachment:    Type:   Image     Comment:   External Document

## 2010-06-27 NOTE — Letter (Signed)
Summary: [facility]  [facility]   Imported By: Maryln Gottron 08/22/2009 13:18:06  _____________________________________________________________________  External Attachment:    Type:   Image     Comment:   External Document

## 2010-06-27 NOTE — Progress Notes (Signed)
Summary: Pt returned call. No need to call back, unless something wrong  Phone Note Call from Patient Call back at [facility] Phone [phone] Call back at [phone] cell    Caller: Patient Summary of Call: Pt returned call. Pt says if labs are normal, theres no need to call back, but if there is a problem pls call.  Initial call taken by: Lucy Antigua,  April 24, 2010 10:53 AM

## 2010-06-27 NOTE — Letter (Signed)
Summary: [facility]  [facility]   Imported By: Maryln Gottron 06/07/2009 10:49:52  _____________________________________________________________________  External Attachment:    Type:   Image     Comment:   External Document

## 2010-06-27 NOTE — Miscellaneous (Signed)
Summary: Orders Update  Clinical Lists Changes  Orders: Added new Referral order of Cardiology Referral (Cardiology) - Signed 

## 2010-06-27 NOTE — Assessment & Plan Note (Signed)
Summary: consult re: pain in hands and feet/feet swelling/cjr/pt rsc/cjr   Vital Signs:  Patient profile:   55 year old female Pulse rate:   68 / minute Pulse rhythm:   regular Resp:     12 per minute BP sitting:   138 / 90  (left arm) Cuff size:   regular  Vitals Entered By: Gladis Riffle, RN (November 08, 2009 8:23 AM) CC: c/o pain and swelling in hands and feet with tingling feeling, family hx heart disease so wants to be sure is not from heart Is Patient Diabetic? No   CC:  c/o pain and swelling in hands and feet with tingling feeling and family hx heart disease so wants to be sure is not from heart.  History of Present Illness: notes that her generalized aches and pains are much better on diclofenac but has developed some lower extremity edema and edema of hands. 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 As Needed 2)  Nasonex 50 Mcg/act  Susp (Mometasone Furoate) .... As Needed 3)  Allegra 180 Mg  Tabs (Fexofenadine Hcl) .... As Needed 4)  Bystolic 10 Mg Tabs (Nebivolol Hcl) .... Take 1 Tablet By Mouth Once A Day As Directed 5)  Hydrochlorothiazide 25 Mg Tabs (Hydrochlorothiazide) .... Take 1 Tablet By Mouth Once A Day As Directed 6)  Simvastatin 20 Mg Tabs (Simvastatin) .... Take One Tablet By Mouth Every Other Day 7)  Vitamin D (Ergocalciferol) 50000 Unit Caps (Ergocalciferol) .Marland Kitchen.. 1 Q Week 8)  Diazepam 5 Mg Tabs (Diazepam) .... One Twice Daily As Needed 9)  Diclofenac  Sodium 50 Mg Tbec (Diclofenac Sodium) .Marland Kitchen.. 1 By Mouth 2 Times Daily. Take With Food  Allergies: 1)  ! Maxzide-25 (Triamterene-Hctz) 2)  ! Lisinopril 3)  ! Norvasc 4)  ! Demerol 5)  Sulfamethoxazole (Sulfamethoxazole) 6)  Penicillin G Potassium (Penicillin G Potassium)  Past History:  Past Medical History: Last updated: 12/21/2006 Allergic rhinitis Diverticulosis, colon Hyperlipidemia Hypertension  Past Surgical History: Last updated: 10/15/2009 Cervical laminectomy Hysterectomy Sinus surgery trigger finger ganglion cyst R knee arthroscop back surgery to remove cyst--March 2011(botero)  Family History: Last updated: 01/30/2009 Family History of CAD Female 1st degree relative <60 Family History of CAD Female 1st degree relative <50 Family History of Colon CA 1st degree relative <60-mother (secondary cancer---could be radiation induced from original) brother CABG at 10 yo. daughter- with lupus anticoagulant, Rheum Arthritis mother with DVT  Social History: Last updated: 12/21/2006 Occupation: Married Never Smoked Regular exercise-yes  Risk Factors: Exercise: yes (12/21/2006)  Risk Factors: Smoking Status: never (11/08/2009)  Physical Exam  General:  alert and well-developed.   Head:  normocephalic and atraumatic.   Msk:  No deformity or scoliosis noted of thoracic or lumbar spine.     Extremities:  No clubbing, cyanosis, edema, or deformity noted    Complete Medication List: 1)  Meclizine Hcl 25 Mg Tabs (Meclizine hcl) .... As needed 2)  Nasonex 50 Mcg/act Susp (Mometasone furoate) .... As needed 3)  Allegra 180 Mg Tabs (Fexofenadine  hcl) .... As needed 4)  Bystolic 10 Mg Tabs (Nebivolol hcl) .... Take 1 tablet by mouth once a day as directed 5)  Hydrochlorothiazide 25 Mg Tabs (Hydrochlorothiazide) .... Take 1 tablet by mouth once a day as directed 6)  Simvastatin 20 Mg Tabs (Simvastatin) .... Take one tablet by mouth every other day 7)  Vitamin D  (ergocalciferol) 50000 Unit Caps (Ergocalciferol) .Marland Kitchen.. 1 q week 8)  Diazepam 5 Mg Tabs (Diazepam) .... One twice daily as needed 9)  Meloxicam 7.5 Mg Tabs (Meloxicam) .... One by mouth daily with food  Patient Instructions: 1)  see me 4-6 weeks Prescriptions: MELOXICAM 7.5 MG  TABS (MELOXICAM) one by mouth daily with food  #30 x 3   Entered and Authorized by:   Birdie Sons MD   Signed by:   Birdie Sons MD on 11/08/2009   Method used:   Electronically to        [facility] (retail)       [address]       [place], [place]  [address]       Ph: [phone]       Fax: [phone]   RxID:   [id number]   Appended Document: consult re: pain in hands and feet/feet swelling/cjr/pt rsc/cjr  Impression & Recommendations:  Problem # 1:  OSTEOARTHRITIS, HAND (ICD-715.94) arthralgias ? cause atttempt at nsaid side effects discussed Her updated medication list for this problem includes:    Meloxicam 7.5 Mg Tabs (Meloxicam) ..... One by mouth daily with food  Complete Medication List: 1)  Meclizine Hcl 25 Mg Tabs (Meclizine hcl) .... As needed 2)  Nasonex 50 Mcg/act Susp (Mometasone furoate) .... As needed 3)  Allegra 180 Mg Tabs (Fexofenadine hcl) .... As needed 4)  Bystolic 10 Mg Tabs (Nebivolol hcl) .... Take 1 tablet by mouth once a day as directed 5)  Hydrochlorothiazide 25 Mg Tabs (Hydrochlorothiazide) .... Take 1 tablet by mouth once a day as directed 6)  Simvastatin 20 Mg Tabs (Simvastatin) .... Take one tablet by mouth every other day 7)  Vitamin D (ergocalciferol) 50000 Unit Caps (Ergocalciferol) .Marland Kitchen.. 1 q week 8)  Diazepam 5 Mg Tabs (Diazepam) .... One twice daily as needed 9)  Meloxicam 7.5 Mg Tabs (Meloxicam) .... One by mouth daily with food

## 2010-06-27 NOTE — Progress Notes (Signed)
Summary: prednisone pack  Phone Note Call from Patient Call back at [facility] Phone [phone] Call back at c [phone]   Summary of Call: Another flareup with neck & shoulder pain.  Requesting prednisone pack.  Saw Dr. Othelia Pulling & Dr. Magnus Sinning, chiropractor, who's faxing his note to you this pm.  Both said arthritis & irritation.  Both also agreed that I need prednisone pack.  Have pain med percocet.  1 1/2 weeks now.  My appointment with you should have been this week, but was bumped.  [employer].  Allergic to Pcn & sulfa.   Initial call taken by: Rudy Jew, RN,  September 03, 2009 3:08 PM  Follow-up for Phone Call        see rx Follow-up by: Birdie Sons MD,  September 03, 2009 3:45 PM  Additional Follow-up for Phone Call Additional follow up Details #1::        Phone Call Completed Additional Follow-up by: Rudy Jew, RN,  September 03, 2009 4:09 PM    New/Updated Medications: PREDNISONE 20 MG TABS (PREDNISONE) 3 po qd for 3 days, then 2 po qd for 3 days, then 1 po qd for 3 days, then 1/2 po qd for 3 days Prescriptions: PREDNISONE 20 MG TABS (PREDNISONE) 3 po qd for 3 days, then 2 po qd for 3 days, then 1 po qd for 3 days, then 1/2 po qd for 3 days  #25 x 0   Entered and Authorized by:   Birdie Sons MD   Signed by:   Birdie Sons MD on 09/03/2009   Method used:   Electronically to        [facility] (retail)       [address]       [place], [place]  [address]       Ph: [phone]       Fax: [phone]   RxID:   [id number]

## 2010-06-27 NOTE — Miscellaneous (Signed)
Summary: Appointment Canceled  Appointment status changed to canceled by [employer] on 04/30/2010 7:57 AM.  Cancellation Comments --------------------- CRS/ WT: 200/ ZO:XWRUEAV. DR. Cato Mulligan . [employer],. Berkley Harvey [id number]/ GD  Appointment Information ----------------------- Appt Type:  CARDIOLOGY NUCLEAR TESTING      Date:  Tuesday, April 30, 2010      Time:  7:30 AM for 15 min   Urgency:  Routine   Made By:  Hoy Finlay Scheduler  To Visit:  [id number]    Reason:  CRS/ WT: 200/ YN:WGNFAOZ. DR. Cato Mulligan . [employer],. AUTH [id number]/ GD  Appt Comments ------------- -- 04/30/10 7:57: (CEMR) CANCELED -- CRS/ WT: 200/ ON:GEXBMWU. DR. Cato Mulligan . [employer],. AUTH [id number]/ GD -- 04/30/10 7:38: (CEMR) ARRIVED -- CRS/ WT: 200/ UV:OZDGUYQ. DR. Cato Mulligan . [employer],. Berkley Harvey [id number]/ GD -- 04/22/10 11:47: (CEMR) BOOKED -- Routine CARDIOL

## 2010-06-27 NOTE — Miscellaneous (Signed)
Summary: Appointment Canceled  Appointment status changed to canceled by [employer] on 04/30/2010 7:57 AM.  Cancellation Comments --------------------- CRS/ WT: 200/ WJ:XBJYNWG. DR. Cato Mulligan . [employer],. Berkley Harvey [phone]/ GD  Appointment Information ----------------------- Appt Type:  CARDIOLOGY NUCLEAR TESTING      Date:  Monday, May 06, 2010      Time:  8:30 AM for 15 min   Urgency:  Routine   Made By:  Hoy Finlay Scheduler  To Visit:  [id number]    Reason:  CRS/ WT: 200/ MV:HQIONGE. DR. Cato Mulligan . [employer],. AUTH [id number]/ GD  Appt Comments ------------- -- 04/30/10 7:57: (CEMR) CANCELED -- CRS/ WT: 200/ GM:WNUUVOZ. DR. Cato Mulligan . [employer],. AUTH [id number]/ GD -- 04/30/10 7:57: (CEMR) BOOKED -- Routine CARDIOLOGY NUCLEAR TESTING at 05/06/2010 8:30 AM for 15 min CRS/ WT: 200/ VQ:QVZDGLO. DR. Cato Mulligan . [employer],. AUT

## 2010-06-27 NOTE — Progress Notes (Signed)
Summary: Nuc. Pre-Procedure  Phone Note Outgoing Call Call back at [facility] Phone [phone]   Call placed by: Irean Hong, RN,  May 02, 2010 3:32 PM Summary of Call: Left message with information on [employer] Information Sheet (see scanned document for details).Written instructions given to patient on 05/06/10.Patsy Edwards,RN.      Nuclear Med Background Indications for Stress Test: Evaluation for Ischemia        Nuclear Pre-Procedure Cardiac Risk Factors: Family History - CAD, Hypertension, Lipids, Overweight Height (in): 64.4

## 2010-06-27 NOTE — Miscellaneous (Signed)
Summary: Immunization Entry   Immunization History:  Influenza Immunization History:    Influenza:  historical (04/11/2010)

## 2010-06-27 NOTE — Assessment & Plan Note (Signed)
Summary: Cardiology Nuclear Testing  Nuclear Med Background Indications for Stress Test: Evaluation for Ischemia   History: Angioplasty   Symptoms: Chest Tightness, Fatigue    Nuclear Pre-Procedure Cardiac Risk Factors: Family History - CAD, Hypertension, Lipids, Overweight Caffeine/Decaff Intake: None NPO After: 10:00 PM Lungs: clear IV 0.9% NS with Angio Cath: 22g     IV Site: R Antecubital IV Started by: Irean Hong, RN Chest Size (in) 38     Cup Size DD     Height (in): 64.4 Weight (lb): 198 BMI: 33.69 Tech Comments: Bystolic held x 24 hrs.  Nuclear Med Study 1 or 2 day study:  1 day     Stress Test Type:  Treadmill/Lexiscan Reading MD:  Olga Millers, MD     Referring MD:  B.Swords  Stress Radionuclide:  Technetium 36m Tetrofosmin     Stress Radionuclide Dose:  33 mCi   Stress Protocol  Max Systolic BP: 164 mm Hg Lexiscan: 0.4 mg   Stress Test Technologist:  Milana Na, EMT-P     Nuclear Technologist:  Doyne Keel, CNMT  Stress Procedure  The patient received IV Lexiscan 0.4 mg over 15-seconds with concurrent low level exercise and then Technetium 35m Tetrofosmin was injected at 30-seconds while the patient continued walking one more minute.  There were no significant changes with Lexiscan.  Quantitative spect images were obtained after a 45 minute delay.  QPS Raw Data Images:  Acquisition technically good; normal left ventricular size. Stress Images:  Normal homogeneous uptake in all areas of the myocardium. Rest Images:  No rest images. Subtraction (SDS):  No evidence of ischemia.  Lung/Heart Ratio:  0.36  (Normal <0.45)  Quantitative Gated Spect Images QGS EDV:  96 ml QGS ESV:  26 ml QGS EF:  73 % QGS cine images:  Normal wall motion.   Overall Impression  Exercise Capacity: Lexiscan with no exercise. BP Response: Normal blood pressure response. Clinical Symptoms: No chest pain ECG Impression: No significant ST segment change suggestive of  ischemia. Overall Impression: Normal lexiscan nuclear study with no ischemia or infarction.  Appended Document: Cardiology Nuclear Testing call patient. Stress test normal.  Appended Document: Cardiology Nuclear Testing Pt. notified.

## 2010-06-27 NOTE — Letter (Signed)
Summary: [facility]  [facility]   Imported By: Maryln Gottron 06/07/2009 10:48:48  _____________________________________________________________________  External Attachment:    Type:   Image     Comment:   External Document

## 2010-06-27 NOTE — Letter (Signed)
Summary: Outpatient Coinsurance Notice  Outpatient Coinsurance Notice   Imported By: Marylou Mccoy 05/13/2010 18:47:25  _____________________________________________________________________  External Attachment:    Type:   Image     Comment:   External Document

## 2010-06-27 NOTE — Assessment & Plan Note (Signed)
Summary: SINUSITIS // RS   Vital Signs:  Patient profile:   55 year old female Temp:     98.7 degrees F oral BP sitting:   160 / 70  (left arm) Cuff size:   regular  Vitals Entered By: Sid Falcon LPN (August 14, 2009 11:58 AM) CC: Cough, sinus, congestion   History of Present Illness: Acute visit. Patient has had several weeks intermittently dark green nasal mucus, intermittent headaches, maxillary facial pressure and temperature 100.6 last night. History of sinus surgery back in 2005. Has used saline nasal irrigation and Mucinex without much improvement. Also has occasional productive cough. Allergy to Rocephin and penicillin.  Had surgery just last Wednesday for benign cyst removal around L5. She has done well since her surgery and recovering well. Minimal back pain.  Allergies: 1)  ! Maxzide-25 (Triamterene-Hctz) 2)  ! Lisinopril 3)  ! Norvasc 4)  ! Demerol 5)  Sulfamethoxazole (Sulfamethoxazole) 6)  Penicillin G Potassium (Penicillin G Potassium)  Past History:  Past Medical History: Last updated: 12/21/2006 Allergic rhinitis Diverticulosis, colon Hyperlipidemia Hypertension  Social History: Last updated: 12/21/2006 Occupation: Married Never Smoked Regular exercise-yes PMH reviewed for relevance, PSH reviewed for relevance  Review of Systems      See HPI  Physical Exam  General:  Well-developed,well-nourished,in no acute distress; alert,appropriate and cooperative throughout examination Ears:  External ear exam shows no significant lesions or deformities.  Otoscopic examination reveals clear canals, tympanic membranes are intact bilaterally without bulging, retraction, inflammation or discharge. Hearing is grossly normal bilaterally. Nose:  External nasal examination shows no deformity or inflammation. Nasal mucosa are pink and moist without lesions or exudates. Mouth:  Oral mucosa and oropharynx without lesions or exudates.  Teeth in good repair. Neck:  No  deformities, masses, or tenderness noted. Lungs:  Normal respiratory effort, chest expands symmetrically. Lungs are clear to auscultation, no crackles or wheezes. Heart:  normal rate and regular rhythm.   Msk:  lower lumbar area reveals vertical incision which is healing well. No erythema or swelling or drainage   Impression & Recommendations:  Problem # 1:  SINUSITIS, ACUTE (ICD-461.9)  Her updated medication list for this problem includes:    Nasonex 50 Mcg/act Susp (Mometasone furoate) .Marland Kitchen... Prn    Levaquin 500 Mg Tabs (Levofloxacin) ..... One by mouth once daily for 10 days  Complete Medication List: 1)  Meclizine Hcl 25 Mg Tabs (Meclizine hcl) .... Prn 2)  Nasonex 50 Mcg/act Susp (Mometasone furoate) .... Prn 3)  Allegra 180 Mg Tabs (Fexofenadine hcl) .... Prn 4)  Bystolic 10 Mg Tabs (Nebivolol hcl) .... Take 1 tablet by mouth once a day as directed 5)  Hydrochlorothiazide 25 Mg Tabs (Hydrochlorothiazide) .... Take 1 tablet by mouth once a day as directed 6)  Simvastatin 20 Mg Tabs (Simvastatin) .... Take one tablet by mouth every other day 7)  Vitamin D (ergocalciferol) 50000 Unit Caps (Ergocalciferol) .Marland Kitchen.. 1 q week 8)  Nabumetone 750 Mg Tabs (Nabumetone) .Marland Kitchen.. 1 two times a day 9)  Diazepam 5 Mg Tabs (Diazepam) .... One twice daily as needed 10)  Levaquin 500 Mg Tabs (Levofloxacin) .... One by mouth once daily for 10 days  Patient Instructions: 1)  Acute sinusitis symptoms for less than 10 days are not helped by antibiotics. Use warm moist compresses, and over the counter decongestants( only as directed). Call if no improvement in 5-7 days, sooner if increasing pain, fever, or new symptoms.  Prescriptions: LEVAQUIN 500 MG TABS (LEVOFLOXACIN) one by mouth once  daily for 10 days  #10 x 0   Entered and Authorized by:   Evelena Peat MD   Signed by:   Evelena Peat MD on 08/14/2009   Method used:   Electronically to        [facility] (retail)       [address]       [place], [place]  [address]       Ph: [phone]       Fax: [phone]   RxID:   [id number]

## 2010-07-03 NOTE — Letter (Signed)
Summary: [facility]   Imported By: Maryln Gottron 06/25/2010 12:22:48  _____________________________________________________________________  External Attachment:    Type:   Image     Comment:   External Document

## 2010-07-13 ENCOUNTER — Encounter: Payer: Self-pay | Admitting: Family Medicine

## 2010-07-13 ENCOUNTER — Ambulatory Visit (INDEPENDENT_AMBULATORY_CARE_PROVIDER_SITE_OTHER): Payer: BC Managed Care – PPO | Admitting: Family Medicine

## 2010-07-13 DIAGNOSIS — J019 Acute sinusitis, unspecified: Secondary | ICD-10-CM | POA: Insufficient documentation

## 2010-07-17 NOTE — Assessment & Plan Note (Signed)
Summary: SINUS INF/VJ   Vital Signs:  Patient profile:   55 year old female Weight:      200 pounds O2 Sat:      95 % Temp:     98.4 degrees F oral Pulse rate:   65 / minute BP sitting:   150 / 90  (left arm) Cuff size:   large  Vitals Entered By: Lamar Sprinkles, CMA (July 13, 2010 12:20 PM) CC: Jan given zpak & then doxy. Has relapse of sinus infection? C/o ear & forehead pressure, cough. Has history of sinus surgery/SD   History of Present Illness: 55 yo woman here today for ? sinus infxn.  has had sinus issues since fall.  again having sinus pressure.  has taken Zpack w/out relief and then Doxy w/ some relief but once meds stopped sxs recurred.  hx of sinus surgery w/ Dr Annalee Genta.  having R ear pain, R facial pain.  + cough- worse at night.  nasal congestion is 'bright green'.  taking Mucinex D w/out relief.  no fevers.  + sick contacts.  Current Medications (verified): 1)  Meclizine Hcl 25 Mg Tabs (Meclizine Hcl) .... As Needed 2)  Nasonex 50 Mcg/act  Susp (Mometasone Furoate) .... As Needed 3)  Allegra 180 Mg  Tabs (Fexofenadine Hcl) .... As Needed 4)  Bystolic 10 Mg Tabs (Nebivolol Hcl) .... Take 1 Tablet By Mouth Once A Day As Directed 5)  Hydrochlorothiazide 25 Mg Tabs (Hydrochlorothiazide) .... Take 1 Tablet By Mouth Once A Day As Directed 6)  Vitamin D (Ergocalciferol) 50000 Unit Caps (Ergocalciferol) .Marland Kitchen.. 1 Q Week 7)  Diazepam 5 Mg Tabs (Diazepam) .... One Twice Daily As Needed 8)  Meloxicam 7.5 Mg  Tabs (Meloxicam) .... One By Mouth Daily With Food  Allergies (verified): 1)  ! Maxzide-25 (Triamterene-Hctz) 2)  ! Lisinopril 3)  ! Norvasc 4)  ! Demerol 5)  Sulfamethoxazole (Sulfamethoxazole) 6)  Penicillin G Potassium (Penicillin G Potassium) 7)  Simvastatin  Review of Systems      See HPI  Physical Exam  General:  Well-developed,well-nourished,in no acute distress; alert,appropriate and cooperative throughout examination Head:  NCAT, + TTP over maxillary  sinuses, R>L Eyes:  no injxn or inflammation Ears:  R ear normal and L ear normal.   Nose:  External nasal examination shows no deformity or inflammation. Nasal mucosa are pink and moist without lesions or exudates. Mouth:  Oral mucosa and oropharynx without lesions or exudates.  Teeth in good repair. Neck:  No deformities, masses, or tenderness noted. Lungs:  Normal respiratory effort, chest expands symmetrically. Lungs are clear to auscultation, no crackles or wheezes.   Impression & Recommendations:  Problem # 1:  SINUSITIS - ACUTE-NOS (ICD-461.9) Assessment New pt's sxs and PE again consistent w/ infxn.  given recent rounds of azithro and doxy will start Avelox.  advised that if no improvement after this round of abx will need to set up appt w/ ENT.  reviewed supportive care and red flags that should prompt return.  Pt expresses understanding and is in agreement w/ this plan. The following medications were removed from the medication list:    Doxycycline Hyclate 100 Mg Caps (Doxycycline hyclate) ..... One by mouth two times a day x 10 days Her updated medication list for this problem includes:    Nasonex 50 Mcg/act Susp (Mometasone furoate) .Marland Kitchen... As needed    Avelox 400 Mg Tabs (Moxifloxacin hcl) .Marland Kitchen... 1 tab by mouth daily x10 days.  take w/ food.  Complete  Medication List: 1)  Meclizine Hcl 25 Mg Tabs (Meclizine hcl) .... As needed 2)  Nasonex 50 Mcg/act Susp (Mometasone furoate) .... As needed 3)  Allegra 180 Mg Tabs (Fexofenadine hcl) .... As needed 4)  Bystolic 10 Mg Tabs (Nebivolol hcl) .... Take 1 tablet by mouth once a day as directed 5)  Hydrochlorothiazide 25 Mg Tabs (Hydrochlorothiazide) .... Take 1 tablet by mouth once a day as directed 6)  Vitamin D (ergocalciferol) 50000 Unit Caps (Ergocalciferol) .Marland Kitchen.. 1 q week 7)  Diazepam 5 Mg Tabs (Diazepam) .... One twice daily as needed 8)  Meloxicam 7.5 Mg Tabs (Meloxicam) .... One by mouth daily with food 9)  Avelox 400 Mg Tabs  (Moxifloxacin hcl) .Marland Kitchen.. 1 tab by mouth daily x10 days.  take w/ food.  Patient Instructions: 1)  You have a sinus infection 2)  Take the Avelox as directed- take w/ food to avoid upset stomach 3)  Add Mucinex to thin your congestion 4)  Drink plenty of fluids 5)  Tylenol/Ibuprofen as needed for pain or fever 6)  REST! 7)  Call with any questions or concerns 8)  Hang in there!!! Prescriptions: AVELOX 400 MG TABS (MOXIFLOXACIN HCL) 1 tab by mouth daily x10 days.  take w/ food.  #10 x 0   Entered and Authorized by:   Neena Rhymes MD   Signed by:   Neena Rhymes MD on 07/13/2010   Method used:   Electronically to        [facility] (retail)       [address]       [place], [place]  [address]       Ph: [phone]       Fax: [phone]   RxID:   [id number]    Orders Added: 1)  Est. Patient Level III [84132]

## 2010-08-16 LAB — BASIC METABOLIC PANEL
BUN: 9 mg/dL (ref 6–23)
Calcium: 9.2 mg/dL (ref 8.4–10.5)
Creatinine, Ser: 0.58 mg/dL (ref 0.4–1.2)
GFR calc Af Amer: >60 mL/min (ref >60)
GFR calc non Af Amer: >60 mL/min (ref >60)
Glucose, Bld: 98 mg/dL (ref 70–99)
Potassium: 4 mEq/L (ref 3.5–5.1)

## 2010-08-16 LAB — CBC
HCT: 41 % (ref 36.0–46.0)
Hemoglobin: 14.3 g/dL (ref 12.0–15.0)
Platelets: 183 10*3/uL (ref 150–400)
RDW: 14.2 % (ref 11.5–15.5)

## 2010-09-06 ENCOUNTER — Encounter: Payer: Self-pay | Admitting: Internal Medicine

## 2010-09-06 ENCOUNTER — Ambulatory Visit (INDEPENDENT_AMBULATORY_CARE_PROVIDER_SITE_OTHER): Payer: BC Managed Care – PPO | Admitting: Internal Medicine

## 2010-09-06 VITALS — BP 134/82 | HR 64 | Temp 98.7°F | Ht 64.0 in | Wt 200.0 lb

## 2010-09-06 DIAGNOSIS — M722 Plantar fascial fibromatosis: Secondary | ICD-10-CM

## 2010-09-08 NOTE — Progress Notes (Signed)
  Subjective:    Patient ID: Ann Fernandez, female    DOB: [birth year], 56 y.o.   MRN: [id number]  Foot Pain This is a new problem. The current episode started 1 to 4 weeks ago. The problem occurs daily. The problem has been unchanged. Associated symptoms include joint swelling and myalgias. Pertinent negatives include no arthralgias. The symptoms are aggravated by standing. She has tried nothing for the symptoms.      Review of Systems  Constitutional: Negative for activity change.  Musculoskeletal: Positive for myalgias and joint swelling. Negative for arthralgias and gait problem.       Objective:   Physical Exam  Constitutional: She appears well-developed.  Musculoskeletal:       FROM both ankles  tenderness to palpation of heel No swelling          Assessment & Plan:  Plantar fasciitis Discussed treatment : ice, stretching, shoe inserts

## 2010-09-09 ENCOUNTER — Other Ambulatory Visit: Payer: Self-pay | Admitting: Internal Medicine

## 2010-09-09 DIAGNOSIS — M1990 Unspecified osteoarthritis, unspecified site: Secondary | ICD-10-CM

## 2010-09-09 DIAGNOSIS — I10 Essential (primary) hypertension: Secondary | ICD-10-CM

## 2010-09-10 NOTE — Telephone Encounter (Signed)
Dr. Swords pt 

## 2010-09-24 ENCOUNTER — Encounter: Payer: Self-pay | Admitting: Internal Medicine

## 2010-09-24 ENCOUNTER — Ambulatory Visit (INDEPENDENT_AMBULATORY_CARE_PROVIDER_SITE_OTHER): Payer: BC Managed Care – PPO | Admitting: Internal Medicine

## 2010-09-24 DIAGNOSIS — I10 Essential (primary) hypertension: Secondary | ICD-10-CM

## 2010-09-24 NOTE — Progress Notes (Signed)
  Subjective:    Patient ID: Ann Fernandez, female    DOB: 04-Mar-1956, 55 y.o.   MRN: [id number]  HPI  Htn: tolerating meds Highest home bp 142/94. Otherwise she reports normal bp She denies any sxs  Past Medical History  Diagnosis Date  . Allergy   . Hyperlipidemia   . Hypertension   . Diverticulosis of colon    Past Surgical History  Procedure Date  . Cervical laminectomy   . Abdominal hysterectomy   . Sinus irrigation   . Trigger finger release   . Knee arthroscopy   . Back surgery     reports that she has never smoked. She does not have any smokeless tobacco history on file. Her alcohol and drug histories not on file. family history includes Cancer in her mother; Deep vein thrombosis in her mother; Heart disease in her brother, father, and mother; Lupus in her daughter; and Pulmonary embolism in her brother. Allergies  Allergen Reactions  . Amlodipine Besylate     REACTION: edema  . Hydrochlorothiazide W/Triamterene     REACTION: hives  . Lisinopril     REACTION: cough  . Meperidine Hcl     REACTION: flushed sensation---treated urgently with benadryl during medical procedure  . Penicillins     REACTION: unspecified  . Simvastatin     REACTION: made legs hurt  . Sulfamethoxazole     REACTION: unspecified    Review of Systems  patient denies chest pain, shortness of breath, orthopnea. Denies lower extremity edema, abdominal pain, change in appetite, change in bowel movements. Patient denies rashes, musculoskeletal complaints. No other specific complaints in a complete review of systems.      Objective:   Physical Exam  Well-developed well-nourished female in no acute distress. HEENT exam atraumatic, normocephalic, extraocular muscles are intact. Neck is supple. No jugular venous distention no thyromegaly. Chest clear to auscultation without increased work of breathing. Cardiac exam S1 and S2 are regular. Abdominal exam active bowel sounds, soft, nontender.  Extremities no edema.       Assessment & Plan:

## 2010-09-24 NOTE — Assessment & Plan Note (Signed)
Fair control Continue same meds 

## 2010-10-11 NOTE — Op Note (Signed)
[facility]  Patient:    Ann Fernandez, Ann Fernandez                      MRN: [id number] Proc. Date: 09/11/00 Adm. 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  PROCEDURE:  Release of the right thumb A1 pulley with debridement of inflammatory, fibrotic material from flexor pollicis longus tendon.  SURGEON:  Katy Fitch. Sypher, Montez Hageman., M.D.  ASSISTANT:  Jonni Sanger, P.A.  ANESTHESIA:  Marcaine 0.25% and 2% lidocaine metacarpal head-level block of right thumb supplemented by IV sedation, supervised by the anesthesiologist, Burna Forts, M.D.  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  She has not responded to nonoperative measures, including splinting, activity modification, and anti-inflammatory medications.  Due to the failure to respond to nonoperative measures, she is brought to the operating room at this time for release of her A1 pulley.  DESCRIPTION OF PROCEDURE:  Ann Fernandez was brought to the operating room, placed in the supine position on the operating table.  Following light sedation, the right arm was prepped with Betadine soap and solution and sterilely draped.  Marcaine 0.25% and 2% lidocaine were infiltrated at the metacarpal head level to obtain a digital block.  When anesthesia was satisfactory, the arm was exsanguinated with an Esmarch bandage and the arterial tourniquet inflated to 220 mmHg.  The procedure commenced with a short transverse incision directly over the palpably enlarged pulley. Subcutaneous tissues were carefully divided, taking care to identify the radial proper digital nerve, which was gently  retracted with a blunt retractor.  The A1 pulley was split with a scalpel and the incision extended proximally and distally with scissors.  This allowed full release of the flexor pollicis longus.  The tendon was delivered and found to have a firm nodule of fibrotic material that almost looked like pseudocartilage.  This was debrided with a rongeur.  Thereafter, free range of motion of the IP joint through a full range was recovered.  The wound was then repaired with mattress sutures of 5-0 nylon.  A compressive dressing was applied with Xeroflo, sterile gauze, and an Ace wrap.  For aftercare, Ann Fernandez is advised to remove her dressing in three days. She will begin immediate active range of motion exercises and return to the office for suture removal in seven to 10 days.  For aftercare, she was given a prescription for Percocet 5 mg one or two p.o. q.4-6h. p.r.n. pain, 20 tablets, no refills. DD:  09/11/00 TD:  09/12/00 Job: [id number]

## 2010-10-11 NOTE — Assessment & Plan Note (Signed)
[facility]                                 ON-CALL NOTE   NAME:Mccambridge, MICHEL ESKELSON                      MRN:          [id number]  DATE:08/29/2006                            DOB:          21-Jan-1956    PRIMARY:  Valetta Mole. Swords, MD   Phone number is [phone]   SUBJECTIVE:  Rash, possibly shingles.   ASSESSMENT/PLAN:  Saturday clinic.     Kerby Nora, MD  Electronically Signed    AB/MedQ  DD: 08/29/2006  DT: 08/29/2006  Job #: [id number]

## 2010-11-08 ENCOUNTER — Other Ambulatory Visit: Payer: Self-pay | Admitting: *Deleted

## 2010-11-08 MED ORDER — MOMETASONE FUROATE 50 MCG/ACT NA SUSP: 2.0000 | Freq: Every day | NASAL | Status: DC

## 2010-11-22 ENCOUNTER — Telehealth: Payer: Self-pay | Admitting: *Deleted

## 2010-11-22 MED ORDER — TOBRAMYCIN-DEXAMETHASONE 0.3-0.1 % OP SUSP: 2.0000 [drp] | OPHTHALMIC | Status: AC

## 2010-11-22 NOTE — Telephone Encounter (Signed)
tobradex 2 drops affected eye(s) 4 times daily for 5 days

## 2010-11-22 NOTE — Telephone Encounter (Signed)
Pt has conjunctivitis and would like RX called to [facility].  She is keeping her grandchildren, and  One of the kids gave it to her.

## 2011-01-14 ENCOUNTER — Other Ambulatory Visit: Payer: Self-pay | Admitting: Internal Medicine

## 2011-03-27 ENCOUNTER — Ambulatory Visit (INDEPENDENT_AMBULATORY_CARE_PROVIDER_SITE_OTHER): Payer: BC Managed Care – PPO | Admitting: Internal Medicine

## 2011-03-27 ENCOUNTER — Encounter: Payer: Self-pay | Admitting: Internal Medicine

## 2011-03-27 DIAGNOSIS — I10 Essential (primary) hypertension: Secondary | ICD-10-CM

## 2011-03-27 DIAGNOSIS — Z0000 Encounter for general adult medical examination without abnormal findings: Secondary | ICD-10-CM

## 2011-03-27 DIAGNOSIS — E785 Hyperlipidemia, unspecified: Secondary | ICD-10-CM

## 2011-03-27 LAB — CBC WITH DIFFERENTIAL/PLATELET
Basophils Absolute: 0 10*3/uL (ref 0.0–0.1)
Basophils Relative: 0.4 % (ref 0.0–3.0)
Eosinophils Absolute: 0.2 10*3/uL (ref 0.0–0.7)
Hemoglobin: 14.5 g/dL (ref 12.0–15.0)
Lymphocytes Relative: 27.4 % (ref 12.0–46.0)
MCHC: 33.8 g/dL (ref 30.0–36.0)
MCV: 94.5 fl (ref 78.0–100.0)
Monocytes Absolute: 0.4 10*3/uL (ref 0.1–1.0)
Neutrophils Relative %: 61.8 % (ref 43.0–77.0)
Platelets: 174 10*3/uL (ref 150.0–400.0)
RBC: 4.55 Mil/uL (ref 3.87–5.11)
RDW: 13.8 % (ref 11.5–14.6)
WBC: 5.5 10*3/uL (ref 4.5–10.5)

## 2011-03-27 LAB — BASIC METABOLIC PANEL
BUN: 12 mg/dL (ref 6–23)
CO2: 26 mEq/L (ref 19–32)
Chloride: 107 mEq/L (ref 96–112)
Glucose, Bld: 96 mg/dL (ref 70–99)
Potassium: 4.2 mEq/L (ref 3.5–5.1)

## 2011-03-27 LAB — LIPID PANEL
Cholesterol: 234 mg/dL — ABNORMAL HIGH (ref 0–200)
Total CHOL/HDL Ratio: 5

## 2011-03-27 LAB — HEPATIC FUNCTION PANEL
ALT: 32 U/L (ref 0–35)
AST: 23 U/L (ref 0–37)
Albumin: 4.2 g/dL (ref 3.5–5.2)
Total Bilirubin: 0.9 mg/dL (ref 0.3–1.2)

## 2011-03-27 NOTE — Assessment & Plan Note (Signed)
Repeat bp is better.  She will continue to monitor at home Continue same meds for now

## 2011-03-27 NOTE — Progress Notes (Signed)
  Subjective:    Patient ID: Ann Fernandez, female    DOB: 1955/12/17, 55 y.o.   MRN: [id number]  HPI Patient Active Problem List  Diagnoses  . HYPERLIPIDEMIA---not taking any meds  . ESSENTIAL HYPERTENSION---tolerating meds. Home BPs 120s/70s  . DIVERTICULOSIS, COLON---no sxs   Past Medical History  Diagnosis Date  . Allergy   . Hyperlipidemia   . Hypertension   . Diverticulosis of colon    Past Surgical History  Procedure Date  . Cervical laminectomy   . Abdominal hysterectomy   . Sinus irrigation   . Trigger finger release   . Knee arthroscopy   . Back surgery     reports that she has never smoked. She does not have any smokeless tobacco history on file. Her alcohol and drug histories not on file. family history includes Cancer in her mother; Deep vein thrombosis in her mother; Heart disease in her brother, father, and mother; Lupus in her daughter; and Pulmonary embolism in her brother. Allergies  Allergen Reactions  . Amlodipine Besylate     REACTION: edema  . Hydrochlorothiazide W/Triamterene     REACTION: hives  . Lisinopril     REACTION: cough  . Meperidine Hcl     REACTION: flushed sensation---treated urgently with benadryl during medical procedure  . Penicillins     REACTION: unspecified  . Simvastatin     REACTION: made legs hurt  . Sulfamethoxazole     REACTION: unspecified   Past Medical History  Diagnosis Date  . Allergy   . Hyperlipidemia   . Hypertension   . Diverticulosis of colon    Past Surgical History  Procedure Date  . Cervical laminectomy   . Abdominal hysterectomy   . Sinus irrigation   . Trigger finger release   . Knee arthroscopy   . Back surgery     reports that she has never smoked. She does not have any smokeless tobacco history on file. Her alcohol and drug histories not on file. family history includes Cancer in her mother; Deep vein thrombosis in her mother; Heart disease in her brother, father, and mother; Lupus in her  daughter; and Pulmonary embolism in her brother. Allergies  Allergen Reactions  . Amlodipine Besylate     REACTION: edema  . Hydrochlorothiazide W/Triamterene     REACTION: hives  . Lisinopril     REACTION: cough  . Meperidine Hcl     REACTION: flushed sensation---treated urgently with benadryl during medical procedure  . Penicillins     REACTION: unspecified  . Simvastatin     REACTION: made legs hurt  . Sulfamethoxazole     REACTION: unspecified    Review of Systems  patient denies chest pain, shortness of breath, orthopnea. Denies lower extremity edema, abdominal pain, change in appetite, change in bowel movements. Patient denies rashes, musculoskeletal complaints. No other specific complaints in a complete review of systems.      Objective:   Physical Exam  Well-developed well-nourished female in no acute distress. HEENT exam atraumatic, normocephalic, extraocular muscles are intact. Neck is supple. No jugular venous distention no thyromegaly. Chest clear to auscultation without increased work of breathing. Cardiac exam S1 and S2 are regular. Abdominal exam active bowel sounds, soft, nontender. Extremities no edema.     Assessment & Plan:

## 2011-04-08 ENCOUNTER — Other Ambulatory Visit: Payer: Self-pay | Admitting: *Deleted

## 2011-04-08 MED ORDER — HYDROCHLOROTHIAZIDE 25 MG PO TABS: 25.0000 mg | ORAL_TABLET | Freq: Every day | ORAL | Status: DC

## 2011-04-22 ENCOUNTER — Other Ambulatory Visit: Payer: Self-pay | Admitting: Internal Medicine

## 2011-05-18 ENCOUNTER — Encounter (HOSPITAL_COMMUNITY): Payer: Self-pay | Admitting: *Deleted

## 2011-05-18 ENCOUNTER — Emergency Department (INDEPENDENT_AMBULATORY_CARE_PROVIDER_SITE_OTHER)
Admission: EM | Admit: 2011-05-18 | Discharge: 2011-05-18 | Disposition: A | Discharge status: Home / Self Care | Payer: BC Managed Care – PPO | Source: Home / Self Care

## 2011-05-18 DIAGNOSIS — R6889 Other general symptoms and signs: Secondary | ICD-10-CM

## 2011-05-18 MED ORDER — GUAIFENESIN-CODEINE 100-10 MG/5ML PO SYRP
ORAL_SOLUTION | ORAL | Status: AC
Start: 2011-05-18 — End: 2011-05-28

## 2011-05-18 MED ORDER — OSELTAMIVIR PHOSPHATE 75 MG PO CAPS: 75.0000 mg | ORAL_CAPSULE | Freq: Two times a day (BID) | ORAL | Status: AC

## 2011-05-18 NOTE — ED Notes (Signed)
Pt with onset of flu like symptoms Friday evening took tylenol x 2 prior to arrival

## 2011-05-18 NOTE — ED Provider Notes (Signed)
History     CSN: [id number]  Arrival date & time 05/18/11  1525   None     Chief Complaint  Patient presents with  . Nasal Congestion  . Cough  . Generalized Body Aches  . Chills  . Fever    (Consider location/radiation/quality/duration/timing/severity/associated sxs/prior treatment) HPI Comments: Onset 48 hrs ago of fever, chills, body aches, cough, and nasal congestion. Cough is nonproductive. No sore throat. She had a HA also, worse with cough, but has imprved. T Max 101+. Has been taking Tylenol for fever. No dyspnea, wheezing, N/V/D. Pts husband also being seen today with same symptoms - onset yesterday. Pt states she was at the beauty shop the day before symptom onset and her hair dresser was c/o body aches and similar symptoms - so this is where she thinks she may have caught it.   Patient is a 55 y.o. female presenting with fever. The history is provided by the patient.  Fever Primary symptoms of the febrile illness include fever, fatigue and cough. Primary symptoms do not include wheezing, shortness of breath, abdominal pain, nausea, vomiting or diarrhea.    Past Medical History  Diagnosis Date  . Allergy   . Hyperlipidemia   . Hypertension   . Diverticulosis of colon     Past Surgical History  Procedure Date  . Cervical laminectomy   . Abdominal hysterectomy   . Sinus irrigation   . Trigger finger release   . Knee arthroscopy   . Back surgery     Family History  Problem Relation Age of Onset  . Heart disease Mother   . Cancer Mother     colon  . Deep vein thrombosis Mother   . Heart disease Father   . Heart disease Brother   . Lupus Daughter   . Pulmonary embolism Brother     History  Substance Use Topics  . Smoking status: Never Smoker   . Smokeless tobacco: Not on file  . Alcohol Use: Not on file    OB History    Grav Para Term Preterm Abortions TAB SAB Ect Mult Living                  Review of Systems  Constitutional: Positive for  fever, chills and fatigue.  HENT: Positive for congestion and rhinorrhea. Negative for ear pain, sore throat and sinus pressure.   Respiratory: Positive for cough. Negative for shortness of breath and wheezing.   Cardiovascular: Negative for chest pain.  Gastrointestinal: Negative for nausea, vomiting, abdominal pain and diarrhea.    Allergies  Amlodipine besylate; Hydrochlorothiazide w/triamterene; Lisinopril; Meperidine hcl; Penicillins; Simvastatin; and Sulfamethoxazole  Home Medications   Current Outpatient Rx  Name Route Sig Dispense Refill  . ACETAMINOPHEN 325 MG RE SUPP Rectal Place 325 mg rectally every 4 (four) hours as needed.      Merrilee Seashore D COLD/FLU/SINUS PO Oral Take by mouth.      . ERGOCALCIFEROL 50000 UNITS PO CAPS Oral Take 50,000 Units by mouth once a week.      Marland Kitchen FEXOFENADINE HCL 180 MG PO TABS Oral Take 180 mg by mouth daily.      . OMEGA-3 FATTY ACIDS 1000 MG PO CAPS Oral Take 2 g by mouth daily.      . GUAIFENESIN-CODEINE 100-10 MG/5ML PO SYRP  1-2 tsp every 6 hrs prn cough 120 mL 0  . HYDROCHLOROTHIAZIDE 25 MG PO TABS Oral Take 12.5 mg by mouth daily.      Marland Kitchen  MECLIZINE HCL 25 MG PO TABS Oral Take 25 mg by mouth 3 (three) times daily as needed.      Marland Kitchen MOBIC 7.5 MG PO TABS  TAKE 1 TABLET DAILY WITH FOOD. 30 each 5  . NASONEX 50 MCG/ACT NA SUSP  USE 2 SPRAYS IN EACH NOSTRIL EVERY DAY. 17 g 5  . NEBIVOLOL HCL 10 MG PO TABS       . OSELTAMIVIR PHOSPHATE 75 MG PO CAPS Oral Take 1 capsule (75 mg total) by mouth every 12 (twelve) hours. 10 capsule 0    BP 143/68  Pulse 68  Temp(Src) 99 F (37.2 C) (Oral)  Resp 17  SpO2 97%  Physical Exam  Nursing note and vitals reviewed. Constitutional: She appears well-developed and well-nourished. No distress.  HENT:  Head: Normocephalic and atraumatic.  Right Ear: Tympanic membrane, external ear and ear canal normal.  Left Ear: Tympanic membrane, external ear and ear canal normal.  Nose: Nose normal.  Mouth/Throat:  Uvula is midline, oropharynx is clear and moist and mucous membranes are normal. No oropharyngeal exudate, posterior oropharyngeal edema or posterior oropharyngeal erythema.  Neck: Neck supple.  Cardiovascular: Normal rate, regular rhythm and normal heart sounds.   Pulmonary/Chest: Effort normal and breath sounds normal. No respiratory distress.  Lymphadenopathy:    She has no cervical adenopathy.  Neurological: She is alert.  Skin: Skin is warm and dry.  Psychiatric: She has a normal mood and affect.    ED Course  Procedures (including critical care time)  Labs Reviewed - No data to display No results found.   1. Flu-like symptoms        MDM  Flu like symptoms x 48 hrs, exam neg, VSS.        Melody Comas, [place] 05/18/11 [phone]

## 2011-05-19 ENCOUNTER — Telehealth: Payer: Self-pay

## 2011-05-19 NOTE — Telephone Encounter (Signed)
Triage Record Num: [id number] Operator: Lodema Pilot Patient Name: Ann Fernandez Call Date & Time: 05/18/2011 12:32:07PM Patient Phone: [phone] PCP: Valetta Mole. Swords Patient Gender: Female PCP Fax : [phone] Patient DOB: 03-16-1956 [facility] Name: [facility] Reason for Call: Caller: Ann Fernandez/Patient; PCP: Birdie Sons; CB#: [phone]; Call regarding Body aches, h/a, fever, flu like sx; Onset: 05/16/11 afternoon. Temp: 101.2 O. Per Flu-Like Symptoms Protocol, advised to go to [facility]. Care advice given. Protocol(s) Used: Flu-Like Symptoms Recommended Outcome per Protocol: Call Provider within 8 Hours Override Outcome if Used in Protocol: See Provider within 4 hours RN Reason for Override Outcome: Nursing Judgement Used. Reason for Outcome: New OR worsening flu-like illness AND in high risk group for complications Care Advice: ~ INFECTION CONTROL ~ Follow public health advisories. Most adults need to drink 6-10 eight-ounce glasses (1.2-2.0 liters) of fluids per day unless previously told to limit fluid intake for other medical reasons. Limit fluids that contain caffeine, sugar or alcohol. Urine will be a very light yellow color when you drink enough fluids. ~ Analgesic/Antipyretic Advice - NSAIDs: Consider aspirin, ibuprofen, naproxen or ketoprofen for pain or fever as directed on label or by pharmacist/provider. PRECAUTIONS: - If over 16 years of age, should not take longer than 1 week without consulting provider. EXCEPTIONS: - Should not be used if taking blood thinners or have bleeding problems. - Do not use if have history of sensitivity/allergy to any of these medications; or history of cardiovascular, ulcer, kidney, liver disease or diabetes unless approved by provider. - Do not exceed recommended dose or frequency. ~ Analgesic/Antipyretic Advice - Acetaminophen: Consider acetaminophen as directed on label or by pharmacist/provider for pain or  fever PRECAUTIONS: - Use if there is no history of liver disease, alcoholism, or intake of three or more alcohol drinks per day - Only if approved by provider during pregnancy or when breastfeeding - During pregnancy, acetaminophen should not be taken more than 3 consecutive days without telling provider - If pregnant, do not take any other analgesic/antipyretic unless approved by your provider - Do not exceed recommended dose or frequency. ~ See a provider immediately or go to the Emergency Department if having chest pain with breathing, change in level of consciousness, new seizure, vomiting and unable to keep fluids down for 8 hours or more, or has not urinated for 8 or more hours. ~ Influenza - Expected Course: - Symptoms start to improve in 3 to 7 days. - Cough and feeling tired (malaise) may continue for several weeks. - Cough and extreme fatigue lasting more than 3 weeks needs medical evaluation. - Remain at home at least 24 hours after being free of fever 100F (37.8C) without the use of fever reducing medication. ~ 05/18/2011 12:47:31PM Page 1 of 2 [id number] [facility]-Nurse Triage Call Report Patient Name: Ann Fernandez continuation page/s Influenza Respiratory Hygiene: - Cover the nose/mouth tightly with a tissue when coughing or sneezing. - Use tissue one time and discard in the nearest waste receptacle. - Wash hands with soap and water or alcohol-based hand rub for at least 15 seconds after coming into contact with respiratory secretions and contaminated objects/materials. - When a tissue is not available, cough into the bend of the elbow. - Avoid touching your eyes, nose or mouth. - When ill wear a disposable face mask when around others, especially around those at high risk for flu-related complications, to help decrease the likelihood of infecting them. ~ To help prevent a widespread community outbreak of the  flu, call the call center, your clinic or provider's  office to discuss any questions or concerns before going in unless you have severe respiratory or any nervous system symptoms. ~ Antiviral medications - Prescribed medications are available in pills, liquids or inhaled powder that help prevent or lessen symptoms of viral illnesses. Antivirals are usually given to persons at a higher risk for flu-related complications or who are very ill. Most healthy people do not need to be treated with antiviral drugs. - Recommended medications for this season: oseltamivir (Tamiflu) and zanamivir (Relenza). - Work best when started within the first two days of symptoms. - Speak with your provider as soon as possible if exposed to the flu or if you have new symptoms of the flu. ~

## 2011-05-20 NOTE — ED Provider Notes (Signed)
Medical screening examination/treatment/procedure(s) were performed by non-physician practitioner and as supervising physician I was immediately available for consultation/collaboration.  Corrie Mckusick, MD 05/20/11 1215

## 2011-07-04 ENCOUNTER — Telehealth: Payer: Self-pay | Admitting: *Deleted

## 2011-07-04 NOTE — Telephone Encounter (Signed)
Patient is calling requesting a prescription for ATB.  She has head congestions and a head cold. Can something be called in?

## 2011-07-04 NOTE — Telephone Encounter (Signed)
Doxycycline 100 mg po bid for 7 days. 

## 2011-07-05 ENCOUNTER — Ambulatory Visit (INDEPENDENT_AMBULATORY_CARE_PROVIDER_SITE_OTHER): Payer: BC Managed Care – PPO | Admitting: Internal Medicine

## 2011-07-05 ENCOUNTER — Encounter: Payer: Self-pay | Admitting: Internal Medicine

## 2011-07-05 DIAGNOSIS — I10 Essential (primary) hypertension: Secondary | ICD-10-CM

## 2011-07-05 DIAGNOSIS — J329 Chronic sinusitis, unspecified: Secondary | ICD-10-CM

## 2011-07-05 DIAGNOSIS — J310 Chronic rhinitis: Secondary | ICD-10-CM

## 2011-07-05 DIAGNOSIS — J309 Allergic rhinitis, unspecified: Secondary | ICD-10-CM | POA: Insufficient documentation

## 2011-07-05 MED ORDER — LEVOFLOXACIN 500 MG PO TABS: 500.0000 mg | ORAL_TABLET | Freq: Every day | ORAL | Status: AC

## 2011-07-05 MED ORDER — MOMETASONE FUROATE 50 MCG/ACT NA SUSP: 2.0000 | Freq: Every day | NASAL | Status: DC

## 2011-07-05 NOTE — Assessment & Plan Note (Signed)
Nasonex Saline spray

## 2011-07-05 NOTE — Assessment & Plan Note (Signed)
Start Levaquin x 10 d

## 2011-07-05 NOTE — Patient Instructions (Addendum)
Use over-the-counter  "cold" medicines  such as"Afrin" nasal spray for nasal congestion as directed instead. Use" Delsym" or" Robitussin" cough syrup varietis for cough.  You can use plain "Tylenol"  for fever, chills and achyness.  Follow up w/Dr Cato Mulligan if BP is elevated at home

## 2011-07-05 NOTE — Assessment & Plan Note (Addendum)
She is on Bystolic and HCTZ at home F/u w/Dr Swords Pt states she has a white coat syndrome and her BP is OK at home.

## 2011-07-05 NOTE — Progress Notes (Signed)
  Subjective:    Patient ID: Ann Fernandez, female    DOB: [birth year], 56 y.o.   MRN: [id number]  HPI  C/o sinus sx's x several weeks - h/o bad sinusitis in the past Her BP is elevated today, she has HTN C/o rhinitis - chronic  Review of Systems  Constitutional: Negative for chills, activity change, appetite change, fatigue and unexpected weight change.  HENT: Positive for ear pain, congestion and rhinorrhea. Negative for mouth sores and sinus pressure.   Eyes: Negative for visual disturbance.  Respiratory: Negative for cough, chest tightness and shortness of breath.   Cardiovascular: Negative for chest pain.  Gastrointestinal: Negative for nausea and abdominal pain.  Genitourinary: Negative for frequency, difficulty urinating and vaginal pain.  Musculoskeletal: Negative for back pain and gait problem.  Skin: Negative for pallor and rash.  Neurological: Negative for dizziness, tremors, weakness, numbness and headaches.  Psychiatric/Behavioral: Negative for confusion and sleep disturbance.       Objective:   Physical Exam  Constitutional: She appears well-developed. No distress.  HENT:  Head: Normocephalic.  Right Ear: External ear normal.  Left Ear: External ear normal.  Nose: Nose normal.       eryth nasal mucosa  Eyes: Conjunctivae are normal. Pupils are equal, round, and reactive to light. Right eye exhibits no discharge. Left eye exhibits no discharge.  Neck: Normal range of motion. Neck supple. No JVD present. No tracheal deviation present. No thyromegaly present.  Cardiovascular: Normal rate, regular rhythm and normal heart sounds.   Pulmonary/Chest: No stridor. No respiratory distress. She has no wheezes.  Abdominal: Soft. Bowel sounds are normal. She exhibits no distension and no mass. There is no tenderness. There is no rebound and no guarding.  Musculoskeletal: She exhibits no edema and no tenderness.  Lymphadenopathy:    She has no cervical adenopathy.    Neurological: She displays normal reflexes. No cranial nerve deficit. She exhibits normal muscle tone. Coordination normal.  Skin: No rash noted. No erythema.  Psychiatric: She has a normal mood and affect. Her behavior is normal. Judgment and thought content normal.          Assessment & Plan:

## 2011-07-07 MED ORDER — DOXYCYCLINE HYCLATE 100 MG PO TABS: 100.0000 mg | ORAL_TABLET | Freq: Two times a day (BID) | ORAL | Status: AC

## 2011-07-07 NOTE — Telephone Encounter (Signed)
Do not know why he routed this to me!

## 2011-07-07 NOTE — Telephone Encounter (Signed)
rx sent in electronically, pt aware 

## 2011-08-16 ENCOUNTER — Other Ambulatory Visit: Payer: Self-pay | Admitting: Internal Medicine

## 2011-09-09 ENCOUNTER — Encounter: Payer: Self-pay | Admitting: Internal Medicine

## 2011-09-16 ENCOUNTER — Other Ambulatory Visit: Payer: Self-pay | Admitting: Internal Medicine

## 2011-09-26 DIAGNOSIS — Z79899 Other long term (current) drug therapy: Secondary | ICD-10-CM

## 2011-09-26 DIAGNOSIS — M1990 Unspecified osteoarthritis, unspecified site: Secondary | ICD-10-CM

## 2011-09-26 HISTORY — DX: Unspecified osteoarthritis, unspecified site: M19.90

## 2011-09-26 HISTORY — DX: Other long term (current) drug therapy: Z79.899

## 2012-03-20 ENCOUNTER — Other Ambulatory Visit: Payer: Self-pay | Admitting: Internal Medicine

## 2012-03-29 ENCOUNTER — Encounter: Payer: BC Managed Care – PPO | Admitting: Internal Medicine

## 2012-04-05 ENCOUNTER — Encounter: Payer: BC Managed Care – PPO | Admitting: Internal Medicine

## 2012-04-27 ENCOUNTER — Other Ambulatory Visit: Payer: Self-pay | Admitting: Internal Medicine

## 2012-05-24 ENCOUNTER — Other Ambulatory Visit: Payer: Self-pay | Admitting: Internal Medicine

## 2012-06-22 ENCOUNTER — Other Ambulatory Visit: Payer: BC Managed Care – PPO

## 2012-06-24 ENCOUNTER — Other Ambulatory Visit: Payer: BC Managed Care – PPO

## 2012-06-29 ENCOUNTER — Encounter: Payer: BC Managed Care – PPO | Admitting: Internal Medicine

## 2012-06-29 ENCOUNTER — Other Ambulatory Visit (INDEPENDENT_AMBULATORY_CARE_PROVIDER_SITE_OTHER): Payer: BC Managed Care – PPO

## 2012-06-29 DIAGNOSIS — Z0000 Encounter for general adult medical examination without abnormal findings: Secondary | ICD-10-CM

## 2012-06-29 LAB — CBC WITH DIFFERENTIAL/PLATELET
Basophils Absolute: 0 10*3/uL (ref 0.0–0.1)
Basophils Relative: 0.4 % (ref 0.0–3.0)
Eosinophils Absolute: 0.2 10*3/uL (ref 0.0–0.7)
Lymphocytes Relative: 26.6 % (ref 12.0–46.0)
MCHC: 33.5 g/dL (ref 30.0–36.0)
MCV: 93.1 fl (ref 78.0–100.0)
Monocytes Absolute: 0.5 10*3/uL (ref 0.1–1.0)
Neutrophils Relative %: 62.9 % (ref 43.0–77.0)
Platelets: 181 10*3/uL (ref 150.0–400.0)
RBC: 4.7 Mil/uL (ref 3.87–5.11)
RDW: 13.4 % (ref 11.5–14.6)
WBC: 6.6 10*3/uL (ref 4.5–10.5)

## 2012-06-29 LAB — LIPID PANEL
Cholesterol: 262 mg/dL — ABNORMAL HIGH (ref 0–200)
Triglycerides: 200 mg/dL — ABNORMAL HIGH (ref 0.0–149.0)
VLDL: 40 mg/dL (ref 0.0–40.0)

## 2012-06-29 LAB — HEPATIC FUNCTION PANEL
Alkaline Phosphatase: 57 U/L (ref 39–117)
Bilirubin, Direct: 0 mg/dL (ref 0.0–0.3)
Total Protein: 7 g/dL (ref 6.0–8.3)

## 2012-06-29 LAB — BASIC METABOLIC PANEL
GFR: 121.34 mL/min (ref >60.00)
Potassium: 4.7 mEq/L (ref 3.5–5.1)
Sodium: 141 mEq/L (ref 135–145)

## 2012-06-29 LAB — POCT URINALYSIS DIPSTICK
Glucose, UA: NEGATIVE
Ketones, UA: NEGATIVE
Leukocytes, UA: NEGATIVE
Protein, UA: NEGATIVE
Spec Grav, UA: 1.02
Urobilinogen, UA: 0.2

## 2012-07-23 ENCOUNTER — Other Ambulatory Visit: Payer: Self-pay | Admitting: Internal Medicine

## 2012-07-24 LAB — HM PAP SMEAR

## 2012-07-26 ENCOUNTER — Encounter: Payer: Self-pay | Admitting: Internal Medicine

## 2012-07-27 ENCOUNTER — Encounter: Payer: BC Managed Care – PPO | Admitting: Internal Medicine

## 2012-08-04 ENCOUNTER — Ambulatory Visit (INDEPENDENT_AMBULATORY_CARE_PROVIDER_SITE_OTHER): Payer: BC Managed Care – PPO | Admitting: Family Medicine

## 2012-08-04 DIAGNOSIS — J329 Chronic sinusitis, unspecified: Secondary | ICD-10-CM

## 2012-08-04 MED ORDER — DOXYCYCLINE HYCLATE 100 MG PO TABS: 100.0000 mg | ORAL_TABLET | Freq: Two times a day (BID) | ORAL | Status: DC

## 2012-08-04 NOTE — Progress Notes (Signed)
No chief complaint on file.   HPI:  Acute visit for sinus congestion: -started 2 weeks ago -symptoms: congestion, sore throat, drainage, hoarseness, sinus pain and pressure, cough - productive of yellow phlegm, sweats, r ear pain -denies: fever, SOB, NVD, tooth pain -hx of sinus surgery and sinus infections in the past - last infection about 1 year ago -allergic to penicillin and sulfa  ROS: See pertinent positives and negatives per HPI.  Past Medical History  Diagnosis Date  . Allergy   . Hyperlipidemia   . Hypertension   . Diverticulosis of colon     Family History  Problem Relation Age of Onset  . Heart disease Mother   . Cancer Mother     colon  . Deep vein thrombosis Mother   . Heart disease Father   . Heart disease Brother   . Lupus Daughter   . Pulmonary embolism Brother     History   Social History  . Marital Status: Married    Spouse Name: N/A    Number of Children: N/A  . Years of Education: N/A   Social History Main Topics  . Smoking status: Never Smoker   . Smokeless tobacco: Not on file  . Alcohol Use: Not on file  . Drug Use: Not on file  . Sexually Active: Not on file   Other Topics Concern  . Not on file   Social History Narrative  . No narrative on file    Current outpatient prescriptions:acetaminophen (TYLENOL) 325 MG suppository, Place 325 mg rectally every 4 (four) hours as needed.  , Disp: , Rfl: ;  BYSTOLIC 10 MG tablet, TAKE 1 TABLET DAILY AS DIRECTED., Disp: 30 each, Rfl: 5;  doxycycline (VIBRA-TABS) 100 MG tablet, Take 1 tablet (100 mg total) by mouth 2 (two) times daily., Disp: 20 tablet, Rfl: 0 ergocalciferol (VITAMIN D2) 50000 UNITS capsule, Take 50,000 Units by mouth once a week.  , Disp: , Rfl: ;  fexofenadine (ALLEGRA) 180 MG tablet, Take 180 mg by mouth daily.  , Disp: , Rfl: ;  fish oil-omega-3 fatty acids 1000 MG capsule, Take 2 g by mouth daily.  , Disp: , Rfl: ;  hydrochlorothiazide (HYDRODIURIL) 25 MG tablet, Take 12.5 mg  by mouth daily.  , Disp: , Rfl:  meclizine (ANTIVERT) 25 MG tablet, Take 25 mg by mouth 3 (three) times daily as needed.  , Disp: , Rfl: ;  MOBIC 7.5 MG tablet, TAKE 1 TABLET DAILY WITH FOOD., Disp: 30 tablet, Rfl: 3;  mometasone (NASONEX) 50 MCG/ACT nasal spray, Place 2 sprays into the nose daily., Disp: 17 g, Rfl: 5  EXAM:  There were no vitals filed for this visit.  There is no weight on file to calculate BMI.  GENERAL: vitals reviewed and listed above, alert, oriented, appears well hydrated and in no acute distress  HEENT: atraumatic, conjunttiva clear, no obvious abnormalities on inspection of external nose and ears, normal appearance of ear canals and TMs, yellow nasal congestion, mild post oropharyngeal erythema with PND, no tonsillar edema or exudate, R max sinus sinus TTP  NECK: no obvious masses on inspection  LUNGS: clear to auscultation bilaterally, no wheezes, rales or rhonchi, good air movement  CV: HRRR, no peripheral edema  MS: moves all extremities without noticeable abnormality  PSYCH: pleasant and cooperative, no obvious depression or anxiety  ASSESSMENT AND PLAN:  Discussed the following assessment and plan:  Sinusitis - Plan: doxycycline (VIBRA-TABS) 100 MG tablet  -smx x 2 weeks and facial  pain, abx reasonable - discussed risks and return precautions -Patient advised to return or notify a doctor immediately if symptoms worsen or persist or new concerns arise.  There are no Patient Instructions on file for this visit.   Kriste Basque R.

## 2012-08-04 NOTE — Patient Instructions (Signed)
INSTRUCTIONS FOR UPPER RESPIRATORY INFECTION:  -plenty of rest and fluids  -As we discussed, we have prescribed a new medication (doxycycline) for you at this appointment. We discussed the common and serious potential adverse effects of this medication and you can review these and more with the pharmacist when you pick up your medication.  Please follow the instructions for use carefully and notify us immediately if you have any problems taking this medication.  -be gentle with your voice  -nasal saline wash 2-3 times daily (use prepackaged nasal saline or bottled/distilled water if making your own)   -can use tylenol or ibuprofen as directed for aches and sorethroat  -in the winter time, using a humidifier at night is helpful (please follow cleaning instructions)  -if you are taking a cough medication - use only as directed, may also try a teaspoon of honey to coat the throat and throat lozenges  -for sore throat, salt water gargles can help  -follow up if you have fevers, facial pain, tooth pain, difficulty breathing or are worsening or not getting better in 5-7 days

## 2012-08-17 ENCOUNTER — Encounter: Payer: Self-pay | Admitting: Internal Medicine

## 2012-08-17 ENCOUNTER — Ambulatory Visit (INDEPENDENT_AMBULATORY_CARE_PROVIDER_SITE_OTHER): Payer: BC Managed Care – PPO | Admitting: Internal Medicine

## 2012-08-17 VITALS — BP 160/84 | HR 60 | Temp 98.4°F | Wt 207.0 lb

## 2012-08-17 DIAGNOSIS — E669 Obesity, unspecified: Secondary | ICD-10-CM

## 2012-08-17 DIAGNOSIS — M549 Dorsalgia, unspecified: Secondary | ICD-10-CM

## 2012-08-17 DIAGNOSIS — G8929 Other chronic pain: Secondary | ICD-10-CM | POA: Insufficient documentation

## 2012-08-17 DIAGNOSIS — Z0000 Encounter for general adult medical examination without abnormal findings: Secondary | ICD-10-CM

## 2012-08-17 MED ORDER — MECLIZINE HCL 25 MG PO TABS: 25.0000 mg | ORAL_TABLET | Freq: Three times a day (TID) | ORAL | Status: DC | PRN

## 2012-08-17 NOTE — Progress Notes (Signed)
Patient ID: Ann Fernandez, female   DOB: 1956-04-13, 57 y.o.   MRN: [id number] [facility]  Home bps 120s/70s  Past Medical History  Diagnosis Date  . Allergy   . Hyperlipidemia   . Hypertension   . Diverticulosis of colon     History   Social History  . Marital Status: Married    Spouse Name: N/A    Number of Children: N/A  . Years of Education: N/A   Occupational History  . Not on file.   Social History Main Topics  . Smoking status: Never Smoker   . Smokeless tobacco: Not on file  . Alcohol Use: Not on file  . Drug Use: Not on file  . Sexually Active: Not on file   Other Topics Concern  . Not on file   Social History Narrative  . No narrative on file    Past Surgical History  Procedure Laterality Date  . Cervical laminectomy    . Abdominal hysterectomy    . Sinus irrigation    . Trigger finger release    . Knee arthroscopy    . Back surgery      Family History  Problem Relation Age of Onset  . Heart disease Mother   . Cancer Mother     colon  . Deep vein thrombosis Mother   . Heart disease Father   . Heart disease Brother   . Lupus Daughter   . Pulmonary embolism Brother     Allergies  Allergen Reactions  . Amlodipine Besylate     REACTION: edema  . Hydrochlorothiazide W-Triamterene     REACTION: hives  . Lisinopril     REACTION: cough  . Meperidine Hcl     REACTION: flushed sensation---treated urgently with benadryl during medical procedure  . Penicillins     REACTION: unspecified  . Simvastatin     REACTION: made legs hurt  . Sulfamethoxazole     REACTION: unspecified    Current Outpatient Prescriptions on File Prior to Visit  Medication Sig Dispense Refill  . BYSTOLIC 10 MG tablet TAKE 1 TABLET DAILY AS DIRECTED.  30 each  5  . ergocalciferol (VITAMIN D2) 50000 UNITS capsule Take 50,000 Units by mouth once a week.        . fexofenadine (ALLEGRA) 180 MG tablet Take 180 mg by mouth daily.        . hydrochlorothiazide (HYDRODIURIL) 25 MG  tablet Take 12.5 mg by mouth daily.        . meclizine (ANTIVERT) 25 MG tablet Take 25 mg by mouth 3 (three) times daily as needed.        Marland Kitchen MOBIC 7.5 MG tablet TAKE 1 TABLET DAILY WITH FOOD.  30 tablet  3  . mometasone (NASONEX) 50 MCG/ACT nasal spray Place 2 sprays into the nose daily.  17 g  5  . fish oil-omega-3 fatty acids 1000 MG capsule Take 2 g by mouth daily.         No current facility-administered medications on file prior to visit.     patient denies chest pain, shortness of breath, orthopnea. Denies lower extremity edema, abdominal pain, change in appetite, change in bowel movements. Patient denies rashes, musculoskeletal complaints. No other specific complaints in a complete review of systems.   BP 160/84  Pulse 60  Temp(Src) 98.4 F (36.9 C) (Oral)  Wt 207 lb (93.895 kg)  BMI 35 kg/m2   Well-developed well-nourished female in no acute distress. HEENT exam atraumatic, normocephalic, extraocular muscles  are intact. Neck is supple. No jugular venous distention no thyromegaly. Chest clear to auscultation without increased work of breathing. Cardiac exam S1 and S2 are regular. Abdominal exam active bowel sounds, soft, nontender. Extremities no edema. Neurologic exam she is alert without any motor sensory deficits. Gait is normal.   Well visit- health maint utd She is not able to tolerate statins  Refer to nutritionist for aggressive weight loss

## 2012-08-24 ENCOUNTER — Telehealth: Payer: Self-pay | Admitting: Obstetrics and Gynecology

## 2012-08-24 ENCOUNTER — Other Ambulatory Visit: Payer: Self-pay | Admitting: Certified Nurse Midwife

## 2012-08-24 NOTE — Telephone Encounter (Signed)
Please advise. Pt had [facility] 2/14 but i don't see where pt had vitamin d rechecked or rx given at [facility]

## 2012-08-24 NOTE — Telephone Encounter (Signed)
Error in previous message. Pt had [facility] 11/13. Disregard message

## 2012-08-24 NOTE — Telephone Encounter (Signed)
Patient was given rx at [facility] 04/14/12 for 1 yr

## 2012-08-26 ENCOUNTER — Ambulatory Visit (AMBULATORY_SURGERY_CENTER): Payer: BC Managed Care – PPO

## 2012-08-26 ENCOUNTER — Encounter: Payer: Self-pay | Admitting: Internal Medicine

## 2012-08-26 ENCOUNTER — Other Ambulatory Visit: Payer: Self-pay | Admitting: *Deleted

## 2012-08-26 VITALS — Ht 64.5 in | Wt 207.0 lb

## 2012-08-26 DIAGNOSIS — Z1211 Encounter for screening for malignant neoplasm of colon: Secondary | ICD-10-CM

## 2012-08-26 DIAGNOSIS — Z800 Family history of malignant neoplasm of digestive organs: Secondary | ICD-10-CM

## 2012-08-26 MED ORDER — MOVIPREP 100 G PO SOLR: 1.0000 | Freq: Once | ORAL | Status: DC

## 2012-08-26 MED ORDER — ERGOCALCIFEROL 1.25 MG (50000 UT) PO CAPS: 50000.0000 [IU] | ORAL_CAPSULE | ORAL | Status: DC

## 2012-08-26 NOTE — Telephone Encounter (Signed)
Pt states that she is sure that Dr. Tresa Res gave her #26/1 rf's @ [facility] 04/14/12; pt says she called the pharmacy but they don't have the refill on file. Pt is needing rf; #26/0rf sent to [facility] pt is aware.

## 2012-09-08 ENCOUNTER — Telehealth: Payer: Self-pay | Admitting: Internal Medicine

## 2012-09-08 NOTE — Telephone Encounter (Signed)
Pt told to take Bystolic po as normal; do not take HCTZ tomorrow until after procedure.  All questions answered

## 2012-09-09 ENCOUNTER — Encounter: Payer: Self-pay | Admitting: Internal Medicine

## 2012-09-09 ENCOUNTER — Ambulatory Visit (AMBULATORY_SURGERY_CENTER): Payer: BC Managed Care – PPO | Admitting: Internal Medicine

## 2012-09-09 VITALS — BP 145/59 | HR 58 | Temp 97.7°F | Resp 19 | Ht 64.5 in | Wt 207.0 lb

## 2012-09-09 DIAGNOSIS — Z1211 Encounter for screening for malignant neoplasm of colon: Secondary | ICD-10-CM

## 2012-09-09 DIAGNOSIS — Z800 Family history of malignant neoplasm of digestive organs: Secondary | ICD-10-CM

## 2012-09-09 HISTORY — PX: COLOSTOMY: SHX63

## 2012-09-09 HISTORY — PX: COLONOSCOPY: SHX174

## 2012-09-09 LAB — HM COLONOSCOPY

## 2012-09-09 MED ORDER — SODIUM CHLORIDE 0.9 % IV SOLN: 500.0000 mL | INTRAVENOUS | Status: DC

## 2012-09-09 NOTE — Progress Notes (Signed)
Patient did not experience any of the following events: a burn prior to discharge; a fall within the facility; wrong site/side/patient/procedure/implant event; or a hospital transfer or hospital admission upon discharge from the facility. (G8907) Patient did not have preoperative order for IV antibiotic SSI prophylaxis. (G8918)  

## 2012-09-09 NOTE — Op Note (Signed)
[facility] [address] [place], [address]   COLONOSCOPY PROCEDURE REPORT  PATIENT: Ann Fernandez, Ann Fernandez  MR#: [id number] BIRTHDATE: 12-Feb-1956 , 56  yrs. old GENDER: Female ENDOSCOPIST: Hart Carwin, MD REFERRED BY:  recall colonoscopy PROCEDURE DATE:  09/09/2012 PROCEDURE:   Colonoscopy, screening ASA CLASS:   Class I INDICATIONS:Patient's immediate family history of colon cancer and mother with colon cancer, prior colon 2003 and 2008. MEDICATIONS: MAC sedation, administered by CRNA and Propofol (Diprivan) 180 mg IV  DESCRIPTION OF PROCEDURE:   After the risks and benefits and of the procedure were explained, informed consent was obtained.  A digital rectal exam revealed no abnormalities of the rectum.    The endoscope was introduced through the anus and advanced to the cecum, which was identified by both the appendix and ileocecal valve .  The quality of the prep was excellent, using MoviPrep . The instrument was then slowly withdrawn as the colon was fully examined.     COLON FINDINGS: Moderate diverticulosis was noted in the sigmoid colon.     Retroflexed views revealed no abnormalities.     The scope was then withdrawn from the patient and the procedure completed.  COMPLICATIONS: There were no complications. ENDOSCOPIC IMPRESSION: Moderate diverticulosis was noted in the sigmoid colon  RECOMMENDATIONS: High fiber diet   REPEAT EXAM: In 5 year(s)  for Colonoscopy.  cc:  _______________________________ eSignedHart Carwin, MD 09/09/2012 8:27 AM

## 2012-09-09 NOTE — Progress Notes (Signed)
NO EGG OR SOY ALLERGY. EWM 

## 2012-09-09 NOTE — Patient Instructions (Addendum)

## 2012-09-13 ENCOUNTER — Telehealth: Payer: Self-pay | Admitting: *Deleted

## 2012-09-13 NOTE — Telephone Encounter (Signed)
  Follow up Call-  Call back number 09/09/2012  Post procedure Call Back phone  # [phone]  Permission to leave phone message Yes     Patient questions:  Do you have a fever, pain , or abdominal swelling? no Pain Score  0 *  Have you tolerated food without any problems? yes  Have you been able to return to your normal activities? yes  Do you have any questions about your discharge instructions: Diet   no Medications  no Follow up visit  no  Do you have questions or concerns about your Care? no  Actions: * If pain score is 4 or above: No action needed, pain <4.

## 2012-09-15 NOTE — Telephone Encounter (Signed)
Done

## 2012-09-21 ENCOUNTER — Other Ambulatory Visit: Payer: Self-pay | Admitting: Internal Medicine

## 2012-10-12 ENCOUNTER — Other Ambulatory Visit: Payer: Self-pay | Admitting: Internal Medicine

## 2012-10-26 ENCOUNTER — Ambulatory Visit: Payer: BC Managed Care – PPO | Admitting: *Deleted

## 2012-11-10 ENCOUNTER — Telehealth: Payer: Self-pay | Admitting: Internal Medicine

## 2012-11-10 NOTE — Telephone Encounter (Signed)
Called in regarding redness and itching to eyes. Attempted to call back and triage, left message to contact office.

## 2012-11-11 ENCOUNTER — Telehealth: Payer: Self-pay | Admitting: Internal Medicine

## 2012-11-11 NOTE — Telephone Encounter (Signed)
Call attempted at 1245 on 6-19, vmail left.

## 2012-12-06 ENCOUNTER — Ambulatory Visit: Payer: BC Managed Care – PPO | Admitting: Dietician

## 2012-12-06 ENCOUNTER — Ambulatory Visit: Payer: BC Managed Care – PPO | Admitting: *Deleted

## 2012-12-20 ENCOUNTER — Ambulatory Visit: Payer: BC Managed Care – PPO | Admitting: Dietician

## 2012-12-21 ENCOUNTER — Ambulatory Visit: Payer: BC Managed Care – PPO | Admitting: *Deleted

## 2013-04-29 ENCOUNTER — Encounter: Payer: Self-pay | Admitting: Obstetrics and Gynecology

## 2013-04-29 ENCOUNTER — Ambulatory Visit: Payer: Self-pay | Admitting: Obstetrics and Gynecology

## 2013-04-29 ENCOUNTER — Ambulatory Visit (INDEPENDENT_AMBULATORY_CARE_PROVIDER_SITE_OTHER): Payer: BC Managed Care – PPO | Admitting: Obstetrics and Gynecology

## 2013-04-29 VITALS — BP 138/80 | HR 78 | Resp 16 | Ht 64.0 in | Wt 209.0 lb

## 2013-04-29 DIAGNOSIS — Z0000 Encounter for general adult medical examination without abnormal findings: Secondary | ICD-10-CM

## 2013-04-29 DIAGNOSIS — Z01419 Encounter for gynecological examination (general) (routine) without abnormal findings: Secondary | ICD-10-CM

## 2013-04-29 DIAGNOSIS — N816 Rectocele: Secondary | ICD-10-CM

## 2013-04-29 DIAGNOSIS — N6310 Unspecified lump in the right breast, unspecified quadrant: Secondary | ICD-10-CM

## 2013-04-29 DIAGNOSIS — N63 Unspecified lump in unspecified breast: Secondary | ICD-10-CM

## 2013-04-29 LAB — POCT URINALYSIS DIPSTICK
Bilirubin, UA: NEGATIVE
Glucose, UA: NEGATIVE
Leukocytes, UA: NEGATIVE
Nitrite, UA: NEGATIVE
Urobilinogen, UA: NEGATIVE

## 2013-04-29 NOTE — Progress Notes (Signed)
GYNECOLOGY VISIT  PCP: Dr. Birdie Sons  Referring provider:   HPI: 57 y.o.   Married  Caucasian  female   G2P2002 with No LMP recorded. Patient has had a hysterectomy.   here for   Annual Exam. Notes hot flashes. Did not tolerate OCPs in the past. Family history of DVT, PE. Weight gain.  Bereavement with loss of mother and brother. Has spinal problems. Difficult to exercise.   Having right arthroscopic surgery at end of this month.  Hgb:  PCP Urine:  Neg  GYNECOLOGIC HISTORY: No LMP recorded. Patient has had a hysterectomy.  Ovaries retained. Sexually active:  Yes Partner preference: Female Contraception:   Hysterectomy Menopausal hormone therapy: No DES exposure:   No Blood transfusions:  No  Sexually transmitted diseases:   No GYN Procedures:  No Mammogram:  12/06/11 neg, Appt today with [facility]               Pap:   01/2008 neg History of abnormal pap smear: 1977 ([facility])    OB History   Grav Para Term Preterm Abortions TAB SAB Ect Mult Living   2 2 2       2        LIFESTYLE: Exercise:   Walking, Lt. Weights, Stretches 3-5 days a week            Tobacco: No Alcohol:No Drug use:  No  OTHER HEALTH MAINTENANCE: Tetanus/TDap:7 years ago Gardisil: No Influenza:  yes Zostavax: No  Bone density: 2007 normal Colonoscopy: 10/2012 normal, repeat in 5 years  Cholesterol check: 2014 elevated, normal w/ medication  Family History  Problem Relation Age of Onset  . Heart disease Mother   . Deep vein thrombosis Mother   . Colon cancer Mother   . Cancer Mother     rectal cancer  . Lung disease Mother   . Heart disease Father   . COPD Father   . Rheum arthritis Father   . Osteoarthritis Father   . Heart disease Brother   . Rheum arthritis Daughter   . Pulmonary embolism Brother     Patient Active Problem List   Diagnosis Date Noted  . Chronic back pain   . Rhinitis 07/05/2011  . HYPERLIPIDEMIA 12/21/2006  . ESSENTIAL HYPERTENSION 12/21/2006  .  DIVERTICULOSIS, COLON 12/21/2006   Past Medical History  Diagnosis Date  . Allergy   . Hyperlipidemia   . Hypertension   . Diverticulosis of colon   . DDD (degenerative disc disease)     Past Surgical History  Procedure Laterality Date  . Cervical laminectomy    . Sinus irrigation    . Trigger finger release    . Knee arthroscopy    . Back surgery    . Cyst removal trunk      LOWER BACK  . Abdominal hysterectomy  1994    TVH for DUB--ovaries retained      ALLERGIES: Amlodipine besylate; Demerol; Hydrochlorothiazide w-triamterene; Lisinopril; Meperidine hcl; Penicillins; Simvastatin; and Sulfamethoxazole  Current Outpatient Prescriptions  Medication Sig Dispense Refill  . BYSTOLIC 10 MG tablet TAKE 1 TABLET DAILY AS DIRECTED.  30 tablet  5  . Calcium Carbonate (CALCIUM 600 PO) Take 1 capsule by mouth daily.      . ergocalciferol (VITAMIN D2) 50000 UNITS capsule Take 1 capsule (50,000 Units total) by mouth once a week.  26 capsule  0  . fexofenadine (ALLEGRA) 180 MG tablet Take 180 mg by mouth daily.        . fish  oil-omega-3 fatty acids 1000 MG capsule Take 2 g by mouth daily.        . hydrochlorothiazide (HYDRODIURIL) 25 MG tablet TAKE ONE TABLET DAILY.  90 tablet  1  . meclizine (ANTIVERT) 25 MG tablet Take 1 tablet (25 mg total) by mouth 3 (three) times daily as needed.  90 tablet  0  . mometasone (NASONEX) 50 MCG/ACT nasal spray Place 2 sprays into the nose daily.  17 g  5  . omeprazole (PRILOSEC) 20 MG capsule as needed.       . pravastatin (PRAVACHOL) 80 MG tablet       . diclofenac (VOLTAREN) 75 MG EC tablet daily.        No current facility-administered medications for this visit.     ROS:  Pertinent items are noted in HPI.  SOCIAL HISTORY:    PHYSICAL EXAMINATION:    BP 138/80  Pulse 78  Resp 16  Ht 5\' 4"  (1.626 m)  Wt 209 lb (94.802 kg)  BMI 35.86 kg/m2   Wt Readings from Last 3 Encounters:  04/29/13 209 lb (94.802 kg)  09/09/12 207 lb (93.895 kg)   08/26/12 207 lb (93.895 kg)     Ht Readings from Last 3 Encounters:  04/29/13 5\' 4"  (1.626 m)  09/09/12 5' 4.5" (1.638 m)  08/26/12 5' 4.5" (1.638 m)    General appearance: alert, cooperative and appears stated age Head: Normocephalic, without obvious abnormality, atraumatic Neck: no adenopathy, supple, symmetrical, trachea midline and thyroid not enlarged, symmetric, no tenderness/mass/nodules Lungs: clear to auscultation bilaterally Breasts: Inspection negative, No nipple retraction or dimpling, No nipple discharge or bleeding, No axillary or supraclavicular adenopathy, Normal to palpation without dominant masses on left.  Right breast with 1.0 cm mass at 7 o'clock. Heart: regular rate and rhythm Abdomen: soft, non-tender; no masses,  no organomegaly Extremities: extremities normal, atraumatic, no cyanosis or edema Skin: Skin color, texture, turgor normal. No rashes or lesions Lymph nodes: Cervical, supraclavicular, and axillary nodes normal. No abnormal inguinal nodes palpated Neurologic: Grossly normal  Pelvic: External genitalia:  no lesions              Urethra:  normal appearing urethra with no masses, tenderness or lesions              Bartholins and Skenes: normal                 Vagina: normal appearing vagina with normal color and discharge, no lesions, second degree rectocele, otherwise good support              Cervix:  absent              Pap and high risk HPV testing done: no.            Bimanual Exam:  Uterus:   absent                                      Adnexa: normal adnexa in size, nontender and no masses                                      Rectovaginal: Confirms  Anus:  normal sphincter tone, no lesions  ASSESSMENT  Right breast mass Rectocele. Menopausal symptoms  PLAN  Mammogram diagnostic and right breast ultrasound at [facility]. Pap smear and high risk HPV testing not indicated. Estroven trial.  Discussed Bradley Ferris  but will not do at this time. Increase exercise - swimming! Discussed rectocele - will do Kegels, continue citrocel,  AGOc handout, also mentioned surgery versus pessary.    Return annually or prn   An After Visit Summary was printed and given to the patient.

## 2013-04-29 NOTE — Progress Notes (Signed)
Spoke with [facility], they will do bilateral dx mammogram on patient today. She will go to [place] now. Faxed order. Patient given copy of order to take with her as well. She is going to [place] now.

## 2013-04-29 NOTE — Patient Instructions (Signed)

## 2013-05-02 ENCOUNTER — Other Ambulatory Visit: Payer: Self-pay

## 2013-05-06 NOTE — Progress Notes (Signed)
Surgery scheduled for 05/25/13 Need orders in EPIC.  Thank You.

## 2013-05-09 ENCOUNTER — Ambulatory Visit (INDEPENDENT_AMBULATORY_CARE_PROVIDER_SITE_OTHER): Payer: BC Managed Care – PPO | Admitting: General Surgery

## 2013-05-09 ENCOUNTER — Encounter (INDEPENDENT_AMBULATORY_CARE_PROVIDER_SITE_OTHER): Payer: Self-pay | Admitting: General Surgery

## 2013-05-09 VITALS — BP 132/80 | HR 87 | Temp 98.9°F | Resp 18 | Ht 64.75 in | Wt 207.0 lb

## 2013-05-09 DIAGNOSIS — N6089 Other benign mammary dysplasias of unspecified breast: Secondary | ICD-10-CM

## 2013-05-09 DIAGNOSIS — N6099 Unspecified benign mammary dysplasia of unspecified breast: Secondary | ICD-10-CM | POA: Insufficient documentation

## 2013-05-09 HISTORY — DX: Unspecified benign mammary dysplasia of unspecified breast: N60.99

## 2013-05-09 NOTE — Patient Instructions (Signed)
Your imaging studies showed a suspicious area of microcalcifications in the upper outer quadrant of the left breast. 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 There is a small but definite chance there may be an early breast cancer there.  You will be scheduled for a left partial mastectomy with needle localization. Another term for this as a lumpectomy.  You'll be able to go home the same day.    Lumpectomy A lumpectomy is a form of "breast conserving" or "breast preservation" surgery. It may also be referred to as a partial mastectomy. During a lumpectomy, the portion of the breast that contains the cancerous tumor or breast mass (the lump) is removed. Some normal tissue around the lump may also be removed to make sure all the tumor has been removed. This surgery should take 40 minutes or less. LET [facility] CARE PROVIDER KNOW ABOUT:  Any allergies you have.  All medicines you are taking, including vitamins, herbs, eye drops, creams, and over-the-counter medicines.  Previous problems you or members of your family have had with the use of anesthetics.  Any blood disorders you have.  Previous surgeries you have had.  Medical conditions you have. RISKS AND COMPLICATIONS Generally, this is a safe procedure. However, as with any procedure, complications can occur. Possible complications include:  Bleeding.  Infection.  Pain.  Temporary swelling.  Change in the shape of the breast, particularly if a large portion is removed. BEFORE THE PROCEDURE  Ask your health care provider about changing or stopping your regular medicines.  Do not eat or drink anything for 7 8 hours before the surgery or as directed by your health care provider. Ask your health care provider if you can take a sip of water with any approved medicines.  On the day of surgery, your healthcare provider will use a mammogram or ultrasound to locate and mark the tumor in your breast. These markings on  your breast will show where the cut (incision) will be made. PROCEDURE   An IV tube will be put into one of your veins.  You may be given medicine to help you relax before the surgery (sedative). You will be given one of the following:  A medicine that numbs the area (local anesthesia).  A medicine that makes you go to sleep (general anesthesia).  Your health care provider will use a kind of electric scalpel that uses heat to minimize bleeding (electrocautery knife).  A curved incision (like a smile or frown) that follows the natural curve of your breast is made, to allow for minimal scarring and better healing.  The tumor will be removed with some of the surrounding tissue. This will be sent to the lab for analysis. Your health care provider may also remove your lymph nodes at this time if needed.  Sometimes, but not always, a rubber tube called a drain will be surgically inserted into your breast area or armpit to collect excess fluid that may accumulate in the space where the tumor was. This drain is connected to a plastic bulb on the outside of your body. This drain creates suction to help remove the fluid.  The incisions will be closed with stitches (sutures).  A bandage may be placed over the incisions. AFTER THE PROCEDURE  You will be taken to the recovery area.  You will be given medicine for pain.  A small rubber drain may be placed in the breast for 2 3 days to prevent a collection of blood (hematoma) from  developing in the breast. You will be given instructions on caring for the drain before you go home.  A pressure bandage (dressing) will be applied for 1 2 days to prevent bleeding. Ask your health care provider how to care for your bandage at home. Document Released: 06/23/2006 Document Revised: 01/12/2013 Document Reviewed: 10/15/2012 [facility] Patient Information 2014 [place], [place].

## 2013-05-09 NOTE — Progress Notes (Signed)
Patient ID: Ann Fernandez, female   DOB: [birth year], 57 y.o.   MRN: [id number]  Chief Complaint  Patient presents with  . New Evaluation    rt breast results    HPI Ann Fernandez is a 57 y.o. female.  She is referred by Dr. 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  The patient has no prior history of breast problems. She saw Dr. Edward Jolly for routine exam and she thought l there were some lumpiness on the right. Mammograms and ultrasounds were done bilaterally. There was no imaging abnormality on the right. On the left, however, there was a highly suspicious area of microcalcifications at the 1:00 position, 1 cm in diameter. 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 She was referred for excision to rule out occult in situ carcinoma.  Patient morbidities include neck fusion, GERD, hyperlipidemia, occasional vertigo, hypertension, and vaginal hysterectomy with retained ovaries. Family history is negative for breast or ovarian cancer. The patient is married has 2 children denies alcohol or tobacco. She is in no distress today.  HPI  Past Medical History  Diagnosis Date  . Allergy   . Hyperlipidemia   . Hypertension   . Diverticulosis of colon   . DDD (degenerative disc disease)   . IBS (irritable bowel syndrome)     Past Surgical History  Procedure Laterality Date  . Cervical laminectomy    . Sinus irrigation    . Trigger finger release    . Knee arthroscopy    . Back surgery    . Cyst removal trunk      LOWER BACK  . Abdominal hysterectomy  1994    TVH for DUB--ovaries retained      Family History  Problem Relation Age of Onset  . Heart disease Mother   . Deep vein thrombosis Mother   . Colon cancer Mother   . Cancer Mother     rectal cancer  . Lung  disease Mother   . Heart disease Father   . COPD Father   . Rheum arthritis Father   . Osteoarthritis Father   . Heart disease Brother   . Rheum arthritis Daughter   . Pulmonary embolism Brother     Social History History  Substance Use Topics  . Smoking status: Never Smoker   . Smokeless tobacco: Never Used  . Alcohol Use: No    Allergies  Allergen Reactions  . Amlodipine Besylate     REACTION: edema  . Demerol [Meperidine]     sweating, very hot  . Hydrochlorothiazide W-Triamterene     REACTION: hives  . Lisinopril     REACTION: cough  . Meperidine Hcl     REACTION: flushed sensation---treated urgently with benadryl during medical procedure  . Penicillins     REACTION: unspecified  . Simvastatin     REACTION: made legs hurt  . Sulfamethoxazole     REACTION: unspecified    Current Outpatient Prescriptions  Medication Sig Dispense Refill  . BYSTOLIC 10 MG tablet TAKE 1 TABLET DAILY AS DIRECTED.  30 tablet  5  . Calcium Carbonate (CALCIUM 600 PO) Take 1 capsule by mouth daily.      . diclofenac (VOLTAREN) 75 MG EC tablet daily.       . ergocalciferol (VITAMIN D2) 50000 UNITS capsule Take 1 capsule (50,000 Units total)  by mouth once a week.  26 capsule  0  . fexofenadine (ALLEGRA) 180 MG tablet Take 180 mg by mouth daily.        . fish oil-omega-3 fatty acids 1000 MG capsule Take 2 g by mouth daily.        . hydrochlorothiazide (HYDRODIURIL) 25 MG tablet TAKE ONE TABLET DAILY.  90 tablet  1  . meclizine (ANTIVERT) 25 MG tablet Take 1 tablet (25 mg total) by mouth 3 (three) times daily as needed.  90 tablet  0  . mometasone (NASONEX) 50 MCG/ACT nasal spray Place 2 sprays into the nose daily.  17 g  5  . omeprazole (PRILOSEC) 20 MG capsule as needed.       . pravastatin (PRAVACHOL) 80 MG tablet        No current facility-administered medications for this visit.    Review of Systems Review of Systems  Constitutional: Negative for fever, chills and unexpected weight  change.  HENT: Negative for congestion, hearing loss, sore throat, trouble swallowing and voice change.   Eyes: Negative for visual disturbance.  Respiratory: Negative for cough and wheezing.   Cardiovascular: Negative for chest pain, palpitations and leg swelling.  Gastrointestinal: Negative for nausea, vomiting, abdominal pain, diarrhea, constipation, blood in stool, abdominal distention and anal bleeding.  Genitourinary: Negative for hematuria, vaginal bleeding and difficulty urinating.  Musculoskeletal: Negative for arthralgias.  Skin: Negative for rash and wound.  Neurological: Negative for seizures, syncope and headaches.  Hematological: Negative for adenopathy. Does not bruise/bleed easily.  Psychiatric/Behavioral: Negative for confusion.    Blood pressure 132/80, pulse 87, temperature 98.9 F (37.2 C), resp. rate 18, height 5' 0.75" (1.543 m), weight 207 lb (93.895 kg).  Physical Exam Physical Exam  Constitutional: She is oriented to person, place, and time. She appears well-developed and well-nourished. No distress.  HENT:  Head: Normocephalic and atraumatic.  Nose: Nose normal.  Mouth/Throat: No oropharyngeal exudate.  Eyes: Conjunctivae and EOM are normal. Pupils are equal, round, and reactive to light. Left eye exhibits no discharge. No scleral icterus.  Neck: Neck supple. No JVD present. No tracheal deviation present. No thyromegaly present.  Transverse scar anterior neck.  Cardiovascular: Normal rate, regular rhythm, normal heart sounds and intact distal pulses.   No murmur heard. Pulmonary/Chest: Effort normal and breath sounds normal. No respiratory distress. She has no wheezes. She has no rales. She exhibits no tenderness.  Breasts moderately large, 38D by history. No palpable mass. Biopsy site upper outer quadrant left breast greater than 10 cm from the nipple. No axillary adenopathy.  Abdominal: Soft. Bowel sounds are normal. She exhibits no distension and no mass.  There is no tenderness. There is no rebound and no guarding.  Musculoskeletal: She exhibits no edema and no tenderness.  Lymphadenopathy:    She has no cervical adenopathy.  Neurological: She is alert and oriented to person, place, and time. She exhibits normal muscle tone. Coordination normal.  Skin: Skin is warm. No rash noted. She is not diaphoretic. No erythema. No pallor.  Psychiatric: She has a normal mood and affect. Her behavior is normal. Judgment and thought content normal.    Data Reviewed Imaging studies. Pathology report  Assessment    Atypical ductal hyperplasia, upper outer quadrant left breast, 1 cm diameter without obvious mass effect. Excisional biopsy indicated to rule out occult carcinoma. This should be done as a conservative lumpectomy with margin assessment  GERD  Hyperlipidemia  Hypertension  History neck fusion  Plan    Scheduled for left partial mastectomy with needle localization  I discussed the indications, details, techniques, and numerous risk of the surgery with her. She is aware of the risk of bleeding, infection, cosmetic deformity, reoperation for positive margins, skin necrosis, and other unforeseen problems. She understands all these issues and all her questions were answered. She agrees with this plan.        Angelia Mould. Derrell Lolling, M.D., [facility] General and Minimally invasive Surgery Breast and Colorectal Surgery Office:   [phone] Pager:   [phone]  05/09/2013, 5:32 PM

## 2013-05-10 ENCOUNTER — Telehealth (INDEPENDENT_AMBULATORY_CARE_PROVIDER_SITE_OTHER): Payer: Self-pay

## 2013-05-10 ENCOUNTER — Other Ambulatory Visit: Payer: Self-pay | Admitting: Orthopedic Surgery

## 2013-05-10 NOTE — Telephone Encounter (Signed)
Message copied by Ivory Broad on Tue Fernandez 16, 2014  3:16 PM ------      Message from: Mervin Kung      Created: Tue Fernandez 16, 2014  1:25 PM      Contact: [phone]       Ann Fernandez,      Pt called she has questions about SX and wants to know if you will call her. First she is worried about having knee SX and Breast Sx within 10 days if its ok to be put to sleep 2 times in 10 days. She feels like we did not put her height in correct on her chart and would like to have that changed.       Ann Fernandez ------

## 2013-05-10 NOTE — Telephone Encounter (Signed)
I called the pt back.  I told her Dr Derrell Lolling said it is safe to have the surgeries that close together.  She told me her height is 23ft 4 and 3/4 in.  I told her I can correct it in her office visit from yesterday.  She states Dr Derrell Lolling asked her what size bra she wears and she actually wears a 38 DD not D.  The last thing is that she is seeing Dr Lia Hopping in [place] (phone [phone]) in the meantime instead of Dr Cato Mulligan.  She would like her note to be sent to him from yesterday.  I will let Dr Derrell Lolling know.  I have added him to her care team.

## 2013-05-10 NOTE — Progress Notes (Signed)
Preoperative surgical orders have been place into the Epic hospital system for KESSLER KOPINSKI on 05/10/2013, 10:03 AM  by Patrica Duel for surgery on 05-25-2013.  Preop Knee Scope orders including IV Tylenol and IV Decadron as long as there are no contraindications to the above medications. Avel Peace, PA-C

## 2013-05-11 NOTE — Telephone Encounter (Signed)
Note sent to Dr. Olena Leatherwood in [place].  hmi

## 2013-05-12 ENCOUNTER — Encounter (HOSPITAL_COMMUNITY): Payer: Self-pay | Admitting: Pharmacy Technician

## 2013-05-13 ENCOUNTER — Encounter (INDEPENDENT_AMBULATORY_CARE_PROVIDER_SITE_OTHER): Payer: Self-pay

## 2013-05-13 ENCOUNTER — Other Ambulatory Visit (HOSPITAL_COMMUNITY): Payer: Self-pay | Admitting: Orthopedic Surgery

## 2013-05-13 NOTE — Patient Instructions (Addendum)
[address] MEYER DOCKERY  05/13/2013   Your procedure is scheduled on:  05/25/13 [facility]  Report to [facility] at 0815      AM.  Call this number if you have problems the morning of surgery: [phone]       Remember:   Do not eat food  Or drink :After Midnight.TUESDAY NIGHT   Take these medicines the morning of surgery with A SIP OF WATER: BYSTOLIC                           May use Flonase   .  Contacts, dentures or partial plates can not be worn to surgery  Leave suitcase in the car. After surgery it may be brought to your room.  For patients admitted to the hospital, checkout time is 11:00 AM day of  discharge.             SPECIAL INSTRUCTIONS- SEE [facility] PREPARING FOR SURGERY INSTRUCTION SHEET-     DO NOT WEAR JEWELRY, LOTIONS, POWDERS, OR PERFUMES.  WOMEN-- DO NOT SHAVE LEGS OR UNDERARMS FOR 12 HOURS BEFORE SHOWERS. MEN MAY SHAVE FACE.  Patients discharged the day of surgery will not be allowed to drive home. IF going home the day of surgery, you must have a driver and someone to stay with you for the first 24 hours  Name and phone number of your driver:        husband                                                                Please read over the following fact sheets that you were given:Incentive Spirometry Sheet                                                                                  Nery Kalisz  PST [phone]  [phone]                 FAILURE TO FOLLOW THESE INSTRUCTIONS MAY RESULT IN  CANCELLATION   OF YOUR SURGERY                                                  Patient Signature _____________________________

## 2013-05-16 ENCOUNTER — Ambulatory Visit (HOSPITAL_COMMUNITY)
Admission: RE | Admit: 2013-05-16 | Discharge: 2013-05-16 | Disposition: A | Discharge status: Home / Self Care | Payer: BC Managed Care – PPO | Source: Ambulatory Visit | Attending: Orthopedic Surgery | Admitting: Orthopedic Surgery

## 2013-05-16 ENCOUNTER — Encounter (HOSPITAL_COMMUNITY)
Admission: RE | Admit: 2013-05-16 | Discharge: 2013-05-16 | Disposition: A | Discharge status: Home / Self Care | Payer: BC Managed Care – PPO | Source: Ambulatory Visit | Attending: Orthopedic Surgery | Admitting: Orthopedic Surgery

## 2013-05-16 ENCOUNTER — Encounter (HOSPITAL_COMMUNITY): Payer: Self-pay

## 2013-05-16 DIAGNOSIS — I10 Essential (primary) hypertension: Secondary | ICD-10-CM | POA: Insufficient documentation

## 2013-05-16 DIAGNOSIS — Z01812 Encounter for preprocedural laboratory examination: Secondary | ICD-10-CM | POA: Insufficient documentation

## 2013-05-16 DIAGNOSIS — Z01818 Encounter for other preprocedural examination: Secondary | ICD-10-CM | POA: Insufficient documentation

## 2013-05-16 DIAGNOSIS — Z01810 Encounter for preprocedural cardiovascular examination: Secondary | ICD-10-CM | POA: Insufficient documentation

## 2013-05-16 HISTORY — DX: Anxiety disorder, unspecified: F41.9

## 2013-05-16 HISTORY — DX: Other specified postprocedural states: Z98.890

## 2013-05-16 HISTORY — DX: Nausea with vomiting, unspecified: R11.2

## 2013-05-16 LAB — BASIC METABOLIC PANEL
BUN: 14 mg/dL (ref 6–23)
CO2: 25 mEq/L (ref 19–32)
Calcium: 9.4 mg/dL (ref 8.4–10.5)
Chloride: 102 mEq/L (ref 96–112)
Creatinine, Ser: 0.63 mg/dL (ref 0.50–1.10)
GFR calc Af Amer: >90 mL/min (ref >90)
Potassium: 3.7 mEq/L (ref 3.5–5.1)

## 2013-05-16 LAB — CBC
HCT: 41.2 % (ref 36.0–46.0)
Hemoglobin: 14.4 g/dL (ref 12.0–15.0)
MCH: 31.4 pg (ref 26.0–34.0)
MCHC: 35 g/dL (ref 30.0–36.0)
MCV: 89.8 fL (ref 78.0–100.0)
Platelets: 190 10*3/uL (ref 150–400)
RDW: 13.1 % (ref 11.5–15.5)

## 2013-05-16 NOTE — Progress Notes (Signed)
Instructed to call Dr Salina April office-[facility]- and ask her to forward message to Dr Lequita Halt  That she is having upcoming breast surgery as follow up from [facility]

## 2013-05-24 NOTE — H&P (Signed)
  Ann Fernandez is a 57 y.o. female who presents with right knee pain.  HPI- . Knee Pain: Patient presents with knee pain involving the  right knee. Onset of the symptoms was several months ago. Inciting event: none known. Current symptoms include foreign body sensation, giving out, pain located medially and stiffness. Pain is aggravated by kneeling, pivoting,squatting.  Patient has had prior knee problems. Evaluation to date: MRI: abnormal medial meniscal tear. Treatment to date: corticosteroid injection which was not very effective.  Past Medical History  Diagnosis Date  . Allergy   . Hyperlipidemia   . Hypertension   . Diverticulosis of colon   . DDD (degenerative disc disease)   . IBS (irritable bowel syndrome)   . PONV (postoperative nausea and vomiting)   . Anxiety     regarding  upcoming breast surgery    Past Surgical History  Procedure Laterality Date  . Cervical laminectomy    . Sinus irrigation    . Trigger finger release    . Knee arthroscopy    . Back surgery    . Cyst removal trunk      LOWER BACK  . Abdominal hysterectomy  1994    TVH for DUB--ovaries retained      Prior to Admission medications   Medication Sig Start Date End Date Taking? Authorizing Provider  calcium-vitamin D (OSCAL WITH D) 500-200 MG-UNIT per tablet Take 1 tablet by mouth 2 (two) times daily.    Historical Provider, MD  diclofenac (VOLTAREN) 75 MG EC tablet Take 75 mg by mouth 2 (two) times daily.    Historical Provider, MD  fexofenadine (ALLEGRA) 180 MG tablet Take 180 mg by mouth at bedtime.     Historical Provider, MD  fish oil-omega-3 fatty acids 1000 MG capsule Take 2 g by mouth daily.      Historical Provider, MD  hydrochlorothiazide (MICROZIDE) 12.5 MG capsule Take 12.5 mg by mouth every morning.    Historical Provider, MD  methylcellulose (CITRUCEL) oral powder Take 1 packet by mouth at bedtime.     Historical Provider, MD  mometasone (NASONEX) 50 MCG/ACT nasal spray Place 2  sprays into the nose daily.    Historical Provider, MD  nebivolol (BYSTOLIC) 10 MG tablet Take 10 mg by mouth every morning.    Historical Provider, MD  omeprazole (PRILOSEC) 20 MG capsule Take 20 mg by mouth every evening.    Historical Provider, MD  pravastatin (PRAVACHOL) 40 MG tablet Take 40 mg by mouth at bedtime.    Historical Provider, MD  Vitamin D, Ergocalciferol, (DRISDOL) 50000 UNITS CAPS capsule Take 50,000 Units by mouth every 7 (seven) days.    Historical Provider, MD   KNEE EXAM antalgic gait, soft tissue tenderness over medial joint line, effusion, negative pivot-shift, collateral ligaments intact  Physical Examination: General appearance - alert, well appearing, and in no distress Mental status - alert, oriented to person, place, and time Chest - clear to auscultation, no wheezes, rales or rhonchi, symmetric air entry Heart - normal rate, regular rhythm, normal S1, S2, no murmurs, rubs, clicks or gallops Abdomen - soft, nontender, nondistended, no masses or organomegaly Neurological - alert, oriented, normal speech, no focal findings or movement disorder noted   Asessment/Plan--- Right knee medial meniscal tear- - Plan left knee arthroscopy with meniscal debridement. Procedure risks and potential comps discussed with patient who elects to proceed. Goals are decreased pain and increased function with a high likelihood of achieving both

## 2013-05-25 ENCOUNTER — Encounter (HOSPITAL_COMMUNITY): Payer: Self-pay | Admitting: *Deleted

## 2013-05-25 ENCOUNTER — Ambulatory Visit (HOSPITAL_COMMUNITY)
Admission: RE | Admit: 2013-05-25 | Discharge: 2013-05-25 | Disposition: A | Discharge status: Home / Self Care | Payer: BC Managed Care – PPO | Source: Ambulatory Visit | Attending: Orthopedic Surgery | Admitting: Orthopedic Surgery

## 2013-05-25 ENCOUNTER — Encounter (HOSPITAL_COMMUNITY)
Admission: RE | Disposition: A | Discharge status: Home / Self Care | Payer: Self-pay | Source: Ambulatory Visit | Attending: Orthopedic Surgery

## 2013-05-25 ENCOUNTER — Ambulatory Visit (HOSPITAL_COMMUNITY): Payer: BC Managed Care – PPO | Admitting: Anesthesiology

## 2013-05-25 ENCOUNTER — Encounter (HOSPITAL_COMMUNITY): Payer: BC Managed Care – PPO | Admitting: Anesthesiology

## 2013-05-25 DIAGNOSIS — Z79899 Other long term (current) drug therapy: Secondary | ICD-10-CM | POA: Insufficient documentation

## 2013-05-25 DIAGNOSIS — S83249A Other tear of medial meniscus, current injury, unspecified knee, initial encounter: Secondary | ICD-10-CM

## 2013-05-25 DIAGNOSIS — M2240 Chondromalacia patellae, unspecified knee: Secondary | ICD-10-CM | POA: Insufficient documentation

## 2013-05-25 DIAGNOSIS — M23359 Other meniscus derangements, posterior horn of lateral meniscus, unspecified knee: Secondary | ICD-10-CM | POA: Insufficient documentation

## 2013-05-25 DIAGNOSIS — S83241A Other tear of medial meniscus, current injury, right knee, initial encounter: Secondary | ICD-10-CM

## 2013-05-25 DIAGNOSIS — I10 Essential (primary) hypertension: Secondary | ICD-10-CM | POA: Insufficient documentation

## 2013-05-25 DIAGNOSIS — E785 Hyperlipidemia, unspecified: Secondary | ICD-10-CM | POA: Insufficient documentation

## 2013-05-25 DIAGNOSIS — M23302 Other meniscus derangements, unspecified lateral meniscus, unspecified knee: Secondary | ICD-10-CM | POA: Insufficient documentation

## 2013-05-25 HISTORY — PX: KNEE ARTHROSCOPY: SHX127

## 2013-05-25 HISTORY — DX: Other tear of medial meniscus, current injury, unspecified knee, initial encounter: S83.249A

## 2013-05-25 SURGERY — ARTHROSCOPY, KNEE
Anesthesia: General | Site: Knee | Laterality: Right

## 2013-05-25 MED ORDER — PROPOFOL 10 MG/ML IV BOLUS
INTRAVENOUS | Status: DC | PRN
  Administered 2013-05-25: 150 mg via INTRAVENOUS

## 2013-05-25 MED ORDER — FENTANYL CITRATE 0.05 MG/ML IJ SOLN
INTRAMUSCULAR | Status: DC | PRN
  Administered 2013-05-25 (×2): 50 ug via INTRAVENOUS

## 2013-05-25 MED ORDER — VANCOMYCIN HCL IN DEXTROSE 1-5 GM/200ML-% IV SOLN
INTRAVENOUS | Status: AC
  Filled 2013-05-25: qty 200

## 2013-05-25 MED ORDER — ACETAMINOPHEN 10 MG/ML IV SOLN
INTRAVENOUS | Status: DC | PRN
  Administered 2013-05-25: 1000 mg via INTRAVENOUS

## 2013-05-25 MED ORDER — METHOCARBAMOL 500 MG PO TABS: 500.0000 mg | ORAL_TABLET | Freq: Four times a day (QID) | ORAL | Status: DC

## 2013-05-25 MED ORDER — KETOROLAC TROMETHAMINE 30 MG/ML IJ SOLN
INTRAMUSCULAR | Status: AC
  Filled 2013-05-25: qty 1

## 2013-05-25 MED ORDER — DEXAMETHASONE SODIUM PHOSPHATE 10 MG/ML IJ SOLN
INTRAMUSCULAR | Status: DC | PRN
  Administered 2013-05-25: 10 mg via INTRAVENOUS

## 2013-05-25 MED ORDER — SODIUM CHLORIDE 0.9 % IV SOLN: INTRAVENOUS | Status: DC

## 2013-05-25 MED ORDER — MIDAZOLAM HCL 5 MG/5ML IJ SOLN
INTRAMUSCULAR | Status: DC | PRN
  Administered 2013-05-25: 2 mg via INTRAVENOUS

## 2013-05-25 MED ORDER — ACETAMINOPHEN 10 MG/ML IV SOLN
1000.0000 mg | Freq: Once | INTRAVENOUS | Status: DC
  Filled 2013-05-25: qty 100

## 2013-05-25 MED ORDER — FENTANYL CITRATE 0.05 MG/ML IJ SOLN
INTRAMUSCULAR | Status: AC
  Filled 2013-05-25: qty 2

## 2013-05-25 MED ORDER — DEXAMETHASONE SODIUM PHOSPHATE 10 MG/ML IJ SOLN: 10.0000 mg | Freq: Once | INTRAMUSCULAR | Status: DC

## 2013-05-25 MED ORDER — ONDANSETRON HCL 4 MG/2ML IJ SOLN
INTRAMUSCULAR | Status: AC
  Filled 2013-05-25: qty 2

## 2013-05-25 MED ORDER — PROMETHAZINE HCL 25 MG/ML IJ SOLN: 6.2500 mg | INTRAMUSCULAR | Status: DC | PRN

## 2013-05-25 MED ORDER — ONDANSETRON HCL 4 MG/2ML IJ SOLN
INTRAMUSCULAR | Status: DC | PRN
  Administered 2013-05-25: 4 mg via INTRAVENOUS

## 2013-05-25 MED ORDER — BUPIVACAINE-EPINEPHRINE 0.25% -1:200000 IJ SOLN
INTRAMUSCULAR | Status: DC | PRN
  Administered 2013-05-25: 20 mL

## 2013-05-25 MED ORDER — VANCOMYCIN HCL IN DEXTROSE 1-5 GM/200ML-% IV SOLN
1000.0000 mg | INTRAVENOUS | Status: AC
  Administered 2013-05-25: 1000 mg via INTRAVENOUS

## 2013-05-25 MED ORDER — BUPIVACAINE-EPINEPHRINE PF 0.25-1:200000 % IJ SOLN
INTRAMUSCULAR | Status: AC
  Filled 2013-05-25: qty 30

## 2013-05-25 MED ORDER — DEXAMETHASONE SODIUM PHOSPHATE 10 MG/ML IJ SOLN
INTRAMUSCULAR | Status: AC
  Filled 2013-05-25: qty 1

## 2013-05-25 MED ORDER — MIDAZOLAM HCL 2 MG/2ML IJ SOLN
INTRAMUSCULAR | Status: AC
  Filled 2013-05-25: qty 2

## 2013-05-25 MED ORDER — LACTATED RINGERS IR SOLN
Status: DC | PRN
  Administered 2013-05-25: 6000 mL

## 2013-05-25 MED ORDER — HYDROCODONE-ACETAMINOPHEN 5-325 MG PO TABS: 1.0000 | ORAL_TABLET | Freq: Four times a day (QID) | ORAL | Status: DC | PRN

## 2013-05-25 MED ORDER — LACTATED RINGERS IV SOLN
INTRAVENOUS | Status: DC | PRN
  Administered 2013-05-25: 09:00:00 via INTRAVENOUS

## 2013-05-25 MED ORDER — FENTANYL CITRATE 0.05 MG/ML IJ SOLN: 25.0000 ug | INTRAMUSCULAR | Status: DC | PRN

## 2013-05-25 MED ORDER — KETOROLAC TROMETHAMINE 30 MG/ML IJ SOLN
15.0000 mg | Freq: Once | INTRAMUSCULAR | Status: AC | PRN
  Administered 2013-05-25: 30 mg via INTRAVENOUS

## 2013-05-25 MED ORDER — LACTATED RINGERS IV SOLN: INTRAVENOUS | Status: DC

## 2013-05-25 MED ORDER — PROPOFOL 10 MG/ML IV BOLUS
INTRAVENOUS | Status: AC
  Filled 2013-05-25: qty 20

## 2013-05-25 SURGICAL SUPPLY — 26 items
BANDAGE ELASTIC 6 VELCRO ST LF (GAUZE/BANDAGES/DRESSINGS) ×2 IMPLANT
BLADE 4.2CUDA (BLADE) ×2 IMPLANT
CLOTH BEACON ORANGE TIMEOUT ST (SAFETY) ×2 IMPLANT
COUNTER NEEDLE 20 DBL MAG RED (NEEDLE) ×2 IMPLANT
CUFF TOURN SGL QUICK 34 (TOURNIQUET CUFF) ×1
CUFF TRNQT CYL 34X4X40X1 (TOURNIQUET CUFF) ×1 IMPLANT
DRAPE U-SHAPE 47X51 STRL (DRAPES) ×2 IMPLANT
DRSG EMULSION OIL 3X3 NADH (GAUZE/BANDAGES/DRESSINGS) ×2 IMPLANT
DURAPREP 26ML APPLICATOR (WOUND CARE) ×2 IMPLANT
GLOVE BIO SURGEON STRL SZ8 (GLOVE) ×2 IMPLANT
GLOVE BIOGEL PI IND STRL 8 (GLOVE) ×1 IMPLANT
GLOVE BIOGEL PI INDICATOR 8 (GLOVE) ×1
GOWN PREVENTION PLUS LG XLONG (DISPOSABLE) ×2 IMPLANT
MANIFOLD NEPTUNE II (INSTRUMENTS) ×2 IMPLANT
PACK ARTHROSCOPY WL (CUSTOM PROCEDURE TRAY) ×2 IMPLANT
PACK ICE MAXI GEL EZY WRAP (MISCELLANEOUS) ×6 IMPLANT
PAD ABD 8X10 STRL (GAUZE/BANDAGES/DRESSINGS) ×2 IMPLANT
PADDING CAST COTTON 6X4 STRL (CAST SUPPLIES) ×2 IMPLANT
POSITIONER SURGICAL ARM (MISCELLANEOUS) ×2 IMPLANT
SET ARTHROSCOPY TUBING (MISCELLANEOUS) ×1
SET ARTHROSCOPY TUBING LN (MISCELLANEOUS) ×1 IMPLANT
SPONGE GAUZE 4X4 12PLY (GAUZE/BANDAGES/DRESSINGS) ×2 IMPLANT
SUT ETHILON 4 0 PS 2 18 (SUTURE) ×2 IMPLANT
TOWEL OR 17X26 10 PK STRL BLUE (TOWEL DISPOSABLE) ×2 IMPLANT
WAND 90 DEG TURBOVAC W/CORD (SURGICAL WAND) ×2 IMPLANT
WRAP KNEE MAXI GEL POST OP (GAUZE/BANDAGES/DRESSINGS) ×2 IMPLANT

## 2013-05-25 NOTE — Op Note (Signed)
Preoperative diagnosis-  Right knee medial meniscal tear  Postoperative diagnosis Right- knee medial and lateral meniscal tears Plus Right medial femoral chondral lesion  Procedure- Right knee arthroscopy with medial and lateral meniscal debridement and chondroplasty   Surgeon- Gus Rankin. Judge Duque, MD  Anesthesia-General  EBL-  minimal Complications- None  Condition- PACU - hemodynamically stable.  Brief clinical note- -Ann Fernandez is a 57 y.o.  female with a several month history of right knee pain and mechanical symptoms. Exam and history suggested medial meniscal tear confirmed by MRI. The patient presents now for arthroscopy and debridement   Procedure in detail -       After successful administration of General anesthetic, a tourmiquet is placed high on the Right  thigh and the Right lower extremity is prepped and draped in the usual sterile fashion. Time out is performed by the surgical team. Standard superomedial and inferolateral portal sites are marked and incisions made with an 11 blade. The inflow cannula is passed through the superomedial portal and camera through the inferolateral portal and inflow is initiated. Arthroscopic visualization proceeds.      The undersurface of the patella and trochlea are visualized and there is Grade II chondromalacia patella but no unstable defects. The medial and lateral gutters are visualized and there are  no loose bodies. Flexion and valgus force is applied to the knee and the medial compartment is entered. A spinal needle is passed into the joint through the site marked for the inferomedial portal. A small incision is made and the dilator passed into the joint. The findings for the medial compartment are large 2 x 3 cm chondral defect medial femoral condyle and degenerative unstable tear posterior horn medial meniscus . The tear is debrided to a stable base with baskets and a shaver and sealed off with the Arthrocare. The shaver is used to  debride the unstable cartilage to a stable bony base with stable edges. The bone is abraded with the shaver.The edge of the defect is probed and found to be stable.    The intercondylar notch is visualized and the ACL appears normal. The lateral compartment is entered and the findings are surface tear of the lateral meniscus with no chondral lesions . The tear is debrided to a stable base with baskets and a shaver and sealed off with the Arthrocare.       The joint is again inspected and there are no other tears, defects or loose bodies identified. The arthroscopic equipment is then removed from the inferior portals which are closed with interrupted 4-0 nylon. 20 ml of .25% Marcaine with epinephrine are injected through the inflow cannula and the cannula is then removed and the portal closed with nylon. The incisions are cleaned and dried and a bulky sterile dressing is applied. The patient is then awakened and transported to recovery in stable condition.   05/25/2013, 11:47 AM

## 2013-05-25 NOTE — Anesthesia Preprocedure Evaluation (Signed)
Anesthesia Evaluation  Patient identified by MRN, date of birth, ID band Patient awake    Reviewed: Allergy & Precautions, H&P , NPO status , Patient's Chart, lab work & pertinent test results  Airway Mallampati: II TM Distance: >3 FB Neck ROM: Full    Dental no notable dental hx.    Pulmonary neg pulmonary ROS,  breath sounds clear to auscultation  Pulmonary exam normal       Cardiovascular hypertension, Pt. on medications Rhythm:Regular Rate:Normal     Neuro/Psych negative neurological ROS  negative psych ROS   GI/Hepatic negative GI ROS, Neg liver ROS,   Endo/Other  negative endocrine ROS  Renal/GU negative Renal ROS  negative genitourinary   Musculoskeletal negative musculoskeletal ROS (+)   Abdominal   Peds negative pediatric ROS (+)  Hematology negative hematology ROS (+)   Anesthesia Other Findings   Reproductive/Obstetrics negative OB ROS                           Anesthesia Physical Anesthesia Plan  ASA: II  Anesthesia Plan: General   Post-op Pain Management:    Induction: Intravenous  Airway Management Planned: LMA  Additional Equipment:   Intra-op Plan:   Post-operative Plan:   Informed Consent: I have reviewed the patients History and Physical, chart, labs and discussed the procedure including the risks, benefits and alternatives for the proposed anesthesia with the patient or authorized representative who has indicated his/her understanding and acceptance.   Dental advisory given  Plan Discussed with: CRNA and Surgeon  Anesthesia Plan Comments:         Anesthesia Quick Evaluation  

## 2013-05-25 NOTE — Transfer of Care (Signed)
Immediate Anesthesia Transfer of Care Note  Patient: Ann Fernandez  Procedure(s) Performed: Procedure(s) (LRB): RIGHT ARTHROSCOPY KNEE, PARTIAL MEDIAL MENISCECTOMY, CHONDROPLASTY, PARTIAL LATERAL MENISCECTOMY (Right)  Patient Location: PACU  Anesthesia Type: General  Level of Consciousness: sedated, patient cooperative and responds to stimulation  Airway & Oxygen Therapy: Patient Spontanous Breathing and Patient connected to face mask oxgen  Post-op Assessment: Report given to PACU RN and Post -op Vital signs reviewed and stable  Post vital signs: Reviewed and stable  Complications: No apparent anesthesia complications

## 2013-05-25 NOTE — Preoperative (Signed)
Beta Blockers   Reason not to administer Beta Blockers:Not Applicable, Took this am 

## 2013-05-25 NOTE — Anesthesia Postprocedure Evaluation (Signed)
  Anesthesia Post-op Note  Patient: Ann Fernandez  Procedure(s) Performed: Procedure(s) (LRB): RIGHT ARTHROSCOPY KNEE, PARTIAL MEDIAL MENISCECTOMY, CHONDROPLASTY, PARTIAL LATERAL MENISCECTOMY (Right)  Patient Location: PACU  Anesthesia Type: General  Level of Consciousness: awake and alert   Airway and Oxygen Therapy: Patient Spontanous Breathing  Post-op Pain: mild  Post-op Assessment: Post-op Vital signs reviewed, Patient's Cardiovascular Status Stable, Respiratory Function Stable, Patent Airway and No signs of Nausea or vomiting  Last Vitals:  Filed Vitals:   05/25/13 1253  BP: 161/79  Pulse: 66  Temp: 36.1 C  Resp: 16    Post-op Vital Signs: stable   Complications: No apparent anesthesia complications

## 2013-05-25 NOTE — Interval H&P Note (Signed)
History and Physical Interval Note:  05/25/2013 11:00 AM  Ann Fernandez  has presented today for surgery, with the diagnosis of RIGHT KNEE MEDIAL MENISCUS TEAR  The various methods of treatment have been discussed with the patient and family. After consideration of risks, benefits and other options for treatment, the patient has consented to  Procedure(s): RIGHT ARTHROSCOPY KNEE WITH DEBRIDEMENT (Right) as a surgical intervention .  The patient's history has been reviewed, patient examined, no change in status, stable for surgery.  I have reviewed the patient's chart and labs.  Questions were answered to the patient's satisfaction.     Loanne Drilling

## 2013-05-27 ENCOUNTER — Encounter (HOSPITAL_COMMUNITY): Payer: Self-pay | Admitting: Orthopedic Surgery

## 2013-05-31 ENCOUNTER — Encounter (HOSPITAL_BASED_OUTPATIENT_CLINIC_OR_DEPARTMENT_OTHER): Payer: Self-pay | Admitting: *Deleted

## 2013-05-31 NOTE — Progress Notes (Signed)
No more labs needed-she just had knee scope-05/25/13-labs and ekg done

## 2013-06-01 NOTE — H&P (Signed)
Ann Fernandez   MRN:  [id number]   Description: 58 year old female  Provider: [name], MD  Department: [facility]         Diagnoses      Atypical ductal hyperplasia, breast    -  Primary      610.8             Current Vitals - Last Recorded      BP Pulse Temp(Src) Resp Ht Wt      132/80 87 98.9 F (37.2 C) 18 5' 4.75" (1.645 m) 207 lb (93.895 kg)    BMI 34.70 kg/m2                      History and Physical   [name], MD      Status: Signed            Patient ID: Ann Fernandez, female   DOB: 17-Oct-1955, 58 y.o.   MRN: [id number]               HPI Ann Fernandez is a 58 y.o. female.  She is referred by Dr. Lenn Sink at [facility] for evaluation and management of a suspicious area of microcalcifications in the left breast, upper outer quadrant, which revealed atypical ductal hyperplasia on image guided biopsy.   The patient has no prior history of breast problems. She saw Dr. Quincy Simmonds for routine exam and she thought l there were some lumpiness on the right. Mammograms and ultrasounds were done bilaterally. There was no imaging abnormality on the right. On the left, however, there was a highly suspicious area of microcalcifications at the 1:00 position, 1 cm in diameter. 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 She was referred for excision to rule out occult in situ carcinoma.   Patient morbidities include neck fusion, GERD, hyperlipidemia, occasional vertigo, hypertension, and vaginal hysterectomy with retained ovaries. Family history is negative for breast or ovarian cancer. The patient is married has 2 children denies alcohol or tobacco. She is in no distress today.        Past Medical History   Diagnosis  Date   .  Allergy     .  Hyperlipidemia     .  Hypertension     .  Diverticulosis of colon     .  DDD (degenerative disc disease)     .  IBS (irritable bowel  syndrome)           Past Surgical History   Procedure  Laterality  Date   .  Cervical laminectomy       .  Sinus irrigation       .  Trigger finger release       .  Knee arthroscopy       .  Back surgery       .  Cyst removal trunk           LOWER BACK   .  Abdominal hysterectomy    1994       TVH for DUB--ovaries retained           Family History   Problem  Relation  Age of Onset   .  Heart disease  Mother     .  Deep vein thrombosis  Mother     .  Colon cancer  Mother     .  Cancer  Mother  rectal cancer   .  Lung disease  Mother     .  Heart disease  Father     .  COPD  Father     .  Rheum arthritis  Father     .  Osteoarthritis  Father     .  Heart disease  Brother     .  Rheum arthritis  Daughter     .  Pulmonary embolism  Brother          Social History History   Substance Use Topics   .  Smoking status:  Never Smoker    .  Smokeless tobacco:  Never Used   .  Alcohol Use:  No         Allergies   Allergen  Reactions   .  Amlodipine Besylate         REACTION: edema   .  Demerol [Meperidine]         sweating, very hot   .  Hydrochlorothiazide W-Triamterene         REACTION: hives   .  Lisinopril         REACTION: cough   .  Meperidine Hcl         REACTION: flushed sensation---treated urgently with benadryl during medical procedure   .  Penicillins         REACTION: unspecified   .  Simvastatin         REACTION: made legs hurt   .  Sulfamethoxazole         REACTION: unspecified         Current Outpatient Prescriptions   Medication  Sig  Dispense  Refill   .  BYSTOLIC 10 MG tablet  TAKE 1 TABLET DAILY AS DIRECTED.   30 tablet   5   .  Calcium Carbonate (CALCIUM 600 PO)  Take 1 capsule by mouth daily.         .  diclofenac (VOLTAREN) 75 MG EC tablet  daily.          .  ergocalciferol (VITAMIN D2) 50000 UNITS capsule  Take 1 capsule (50,000 Units total) by mouth once a week.   26 capsule   0   .  fexofenadine (ALLEGRA) 180 MG tablet   Take 180 mg by mouth daily.           .  fish oil-omega-3 fatty acids 1000 MG capsule  Take 2 g by mouth daily.           .  hydrochlorothiazide (HYDRODIURIL) 25 MG tablet  TAKE ONE TABLET DAILY.   90 tablet   1   .  meclizine (ANTIVERT) 25 MG tablet  Take 1 tablet (25 mg total) by mouth 3 (three) times daily as needed.   90 tablet   0   .  mometasone (NASONEX) 50 MCG/ACT nasal spray  Place 2 sprays into the nose daily.   17 g   5   .  omeprazole (PRILOSEC) 20 MG capsule  as needed.          .  pravastatin (PRAVACHOL) 80 MG tablet               No current facility-administered medications for this visit.        Review of Systems   Constitutional: Negative for fever, chills and unexpected weight change.  HENT: Negative for congestion, hearing loss, sore throat, trouble swallowing and voice change.   Eyes: Negative for visual  disturbance.  Respiratory: Negative for cough and wheezing.   Cardiovascular: Negative for chest pain, palpitations and leg swelling.  Gastrointestinal: Negative for nausea, vomiting, abdominal pain, diarrhea, constipation, blood in stool, abdominal distention and anal bleeding.  Genitourinary: Negative for hematuria, vaginal bleeding and difficulty urinating.  Musculoskeletal: Negative for arthralgias.  Skin: Negative for rash and wound.  Neurological: Negative for seizures, syncope and headaches.  Hematological: Negative for adenopathy. Does not bruise/bleed easily.  Psychiatric/Behavioral: Negative for confusion.      Blood pressure 132/80, pulse 87, temperature 98.9 F (37.2 C), resp. rate 18, height 5' 0.75" (1.543 m), weight 207 lb (93.895 kg).   Physical Exam  Constitutional: She is oriented to person, place, and time. She appears well-developed and well-nourished. No distress.  HENT:   Head: Normocephalic and atraumatic.   Nose: Nose normal.   Mouth/Throat: No oropharyngeal exudate.  Eyes: Conjunctivae and EOM are normal. Pupils are equal,  round, and reactive to light. Left eye exhibits no discharge. No scleral icterus.  Neck: Neck supple. No JVD present. No tracheal deviation present. No thyromegaly present.  Transverse scar anterior neck.  Cardiovascular: Normal rate, regular rhythm, normal heart sounds and intact distal pulses.    No murmur heard. Pulmonary/Chest: Effort normal and breath sounds normal. No respiratory distress. She has no wheezes. She has no rales. She exhibits no tenderness.  Breasts moderately large, 38D by history. No palpable mass. Biopsy site upper outer quadrant left breast greater than 10 cm from the nipple. No axillary adenopathy.  Abdominal: Soft. Bowel sounds are normal. She exhibits no distension and no mass. There is no tenderness. There is no rebound and no guarding.  Musculoskeletal: She exhibits no edema and no tenderness.  Lymphadenopathy:    She has no cervical adenopathy.  Neurological: She is alert and oriented to person, place, and time. She exhibits normal muscle tone. Coordination normal.  Skin: Skin is warm. No rash noted. She is not diaphoretic. No erythema. No pallor.  Psychiatric: She has a normal mood and affect. Her behavior is normal. Judgment and thought content normal.      Data Reviewed Imaging studies. Pathology report   Assessment    Atypical ductal hyperplasia, upper outer quadrant left breast, 1 cm diameter without obvious mass effect. Excisional biopsy indicated to rule out occult carcinoma. This should be done as a conservative lumpectomy with margin assessment   GERD   Hyperlipidemia   Hypertension   History neck fusion      Plan    Scheduled for left partial mastectomy with needle localization   I discussed the indications, details, techniques, and numerous risk of the surgery with her. She is aware of the risk of bleeding, infection, cosmetic deformity, reoperation for positive margins, skin necrosis, and other unforeseen problems. She understands  all these issues and all her questions were answered. She agrees with this plan.         Edsel Petrin. Dalbert Batman, M.D., [facility] General and Minimally invasive Surgery Breast and Colorectal Surgery Office:   [phone] Pager:   [phone]

## 2013-06-03 ENCOUNTER — Ambulatory Visit (HOSPITAL_BASED_OUTPATIENT_CLINIC_OR_DEPARTMENT_OTHER): Payer: BC Managed Care – PPO | Admitting: Certified Registered"

## 2013-06-03 ENCOUNTER — Encounter (HOSPITAL_BASED_OUTPATIENT_CLINIC_OR_DEPARTMENT_OTHER): Payer: BC Managed Care – PPO | Admitting: Certified Registered"

## 2013-06-03 ENCOUNTER — Ambulatory Visit (HOSPITAL_BASED_OUTPATIENT_CLINIC_OR_DEPARTMENT_OTHER)
Admission: RE | Admit: 2013-06-03 | Discharge: 2013-06-03 | Disposition: A | Discharge status: Home / Self Care | Payer: BC Managed Care – PPO | Source: Ambulatory Visit | Attending: General Surgery | Admitting: General Surgery

## 2013-06-03 ENCOUNTER — Encounter (HOSPITAL_BASED_OUTPATIENT_CLINIC_OR_DEPARTMENT_OTHER): Payer: Self-pay

## 2013-06-03 ENCOUNTER — Encounter (HOSPITAL_BASED_OUTPATIENT_CLINIC_OR_DEPARTMENT_OTHER)
Admission: RE | Disposition: A | Discharge status: Home / Self Care | Payer: Self-pay | Source: Ambulatory Visit | Attending: General Surgery

## 2013-06-03 DIAGNOSIS — N6099 Unspecified benign mammary dysplasia of unspecified breast: Secondary | ICD-10-CM | POA: Diagnosis present

## 2013-06-03 DIAGNOSIS — I10 Essential (primary) hypertension: Secondary | ICD-10-CM | POA: Diagnosis not present

## 2013-06-03 DIAGNOSIS — K219 Gastro-esophageal reflux disease without esophagitis: Secondary | ICD-10-CM | POA: Insufficient documentation

## 2013-06-03 DIAGNOSIS — N6089 Other benign mammary dysplasias of unspecified breast: Secondary | ICD-10-CM | POA: Insufficient documentation

## 2013-06-03 HISTORY — DX: Presence of spectacles and contact lenses: Z97.3

## 2013-06-03 HISTORY — PX: PARTIAL MASTECTOMY WITH NEEDLE LOCALIZATION: SHX6008

## 2013-06-03 HISTORY — DX: Gastro-esophageal reflux disease without esophagitis: K21.9

## 2013-06-03 LAB — POCT HEMOGLOBIN-HEMACUE: Hemoglobin: 14.8 g/dL (ref 12.0–15.0)

## 2013-06-03 SURGERY — PARTIAL MASTECTOMY WITH NEEDLE LOCALIZATION
Anesthesia: General | Site: Breast | Laterality: Left

## 2013-06-03 MED ORDER — BUPIVACAINE-EPINEPHRINE 0.5% -1:200000 IJ SOLN
INTRAMUSCULAR | Status: DC | PRN
  Administered 2013-06-03: 10 mL

## 2013-06-03 MED ORDER — OXYCODONE HCL 5 MG/5ML PO SOLN: 5.0000 mg | Freq: Once | ORAL | Status: AC | PRN

## 2013-06-03 MED ORDER — MIDAZOLAM HCL 5 MG/5ML IJ SOLN
INTRAMUSCULAR | Status: DC | PRN
  Administered 2013-06-03: 2 mg via INTRAVENOUS

## 2013-06-03 MED ORDER — LIDOCAINE HCL (CARDIAC) 20 MG/ML IV SOLN
INTRAVENOUS | Status: DC | PRN
  Administered 2013-06-03: 60 mg via INTRAVENOUS

## 2013-06-03 MED ORDER — MEPERIDINE HCL 25 MG/ML IJ SOLN: 6.2500 mg | INTRAMUSCULAR | Status: DC | PRN

## 2013-06-03 MED ORDER — HYDROCODONE-ACETAMINOPHEN 5-325 MG PO TABS: 1.0000 | ORAL_TABLET | ORAL | Status: DC | PRN

## 2013-06-03 MED ORDER — FENTANYL CITRATE 0.05 MG/ML IJ SOLN: 25.0000 ug | INTRAMUSCULAR | Status: DC | PRN

## 2013-06-03 MED ORDER — ONDANSETRON HCL 4 MG/2ML IJ SOLN: 4.0000 mg | Freq: Once | INTRAMUSCULAR | Status: DC | PRN

## 2013-06-03 MED ORDER — ONDANSETRON HCL 4 MG/2ML IJ SOLN: 4.0000 mg | Freq: Four times a day (QID) | INTRAMUSCULAR | Status: DC | PRN

## 2013-06-03 MED ORDER — MIDAZOLAM HCL 2 MG/2ML IJ SOLN: 1.0000 mg | INTRAMUSCULAR | Status: DC | PRN

## 2013-06-03 MED ORDER — BUPIVACAINE-EPINEPHRINE PF 0.5-1:200000 % IJ SOLN
INTRAMUSCULAR | Status: AC
  Filled 2013-06-03: qty 30

## 2013-06-03 MED ORDER — FENTANYL CITRATE 0.05 MG/ML IJ SOLN
INTRAMUSCULAR | Status: DC | PRN
  Administered 2013-06-03: 100 ug via INTRAVENOUS

## 2013-06-03 MED ORDER — FENTANYL CITRATE 0.05 MG/ML IJ SOLN
INTRAMUSCULAR | Status: AC
  Filled 2013-06-03: qty 8

## 2013-06-03 MED ORDER — SODIUM CHLORIDE 0.9 % IV SOLN: INTRAVENOUS | Status: DC

## 2013-06-03 MED ORDER — LACTATED RINGERS IV SOLN
INTRAVENOUS | Status: DC
  Administered 2013-06-03 (×2): via INTRAVENOUS

## 2013-06-03 MED ORDER — SODIUM CHLORIDE 0.9 % IJ SOLN: 3.0000 mL | INTRAMUSCULAR | Status: DC | PRN

## 2013-06-03 MED ORDER — ONDANSETRON HCL 4 MG/2ML IJ SOLN
INTRAMUSCULAR | Status: DC | PRN
  Administered 2013-06-03: 4 mg via INTRAVENOUS

## 2013-06-03 MED ORDER — FENTANYL CITRATE 0.05 MG/ML IJ SOLN: 50.0000 ug | INTRAMUSCULAR | Status: DC | PRN

## 2013-06-03 MED ORDER — VANCOMYCIN HCL IN DEXTROSE 1-5 GM/200ML-% IV SOLN
INTRAVENOUS | Status: AC
  Filled 2013-06-03: qty 200

## 2013-06-03 MED ORDER — PROPOFOL 10 MG/ML IV BOLUS
INTRAVENOUS | Status: DC | PRN
  Administered 2013-06-03: 150 mg via INTRAVENOUS

## 2013-06-03 MED ORDER — HYDROMORPHONE HCL PF 1 MG/ML IJ SOLN: 0.2500 mg | INTRAMUSCULAR | Status: DC | PRN

## 2013-06-03 MED ORDER — ACETAMINOPHEN 650 MG RE SUPP
650.0000 mg | RECTAL | Status: DC | PRN
Start: 2013-06-03 — End: 2013-06-03

## 2013-06-03 MED ORDER — OXYCODONE HCL 5 MG PO TABS
5.0000 mg | ORAL_TABLET | Freq: Once | ORAL | Status: AC | PRN
Start: 2013-06-03 — End: 2013-06-03
  Administered 2013-06-03: 5 mg via ORAL
  Filled 2013-06-03: qty 1

## 2013-06-03 MED ORDER — OXYCODONE HCL 5 MG PO TABS: 5.0000 mg | ORAL_TABLET | ORAL | Status: DC | PRN

## 2013-06-03 MED ORDER — SODIUM CHLORIDE 0.9 % IV SOLN: 250.0000 mL | INTRAVENOUS | Status: DC | PRN

## 2013-06-03 MED ORDER — DEXAMETHASONE SODIUM PHOSPHATE 4 MG/ML IJ SOLN
INTRAMUSCULAR | Status: DC | PRN
  Administered 2013-06-03: 4 mg via INTRAVENOUS

## 2013-06-03 MED ORDER — VANCOMYCIN HCL IN DEXTROSE 1-5 GM/200ML-% IV SOLN
1000.0000 mg | INTRAVENOUS | Status: AC
  Administered 2013-06-03: 1000 mg via INTRAVENOUS

## 2013-06-03 MED ORDER — ACETAMINOPHEN 325 MG PO TABS: 650.0000 mg | ORAL_TABLET | ORAL | Status: DC | PRN

## 2013-06-03 MED ORDER — SODIUM CHLORIDE 0.9 % IJ SOLN: 3.0000 mL | Freq: Two times a day (BID) | INTRAMUSCULAR | Status: DC

## 2013-06-03 MED ORDER — CHLORHEXIDINE GLUCONATE 4 % EX LIQD: 1.0000 "application " | Freq: Once | CUTANEOUS | Status: DC

## 2013-06-03 MED ORDER — MIDAZOLAM HCL 2 MG/2ML IJ SOLN
INTRAMUSCULAR | Status: AC
  Filled 2013-06-03: qty 2

## 2013-06-03 SURGICAL SUPPLY — 59 items
APPLIER CLIP 9.375 MED OPEN (MISCELLANEOUS)
BANDAGE ELASTIC 6 VELCRO ST LF (GAUZE/BANDAGES/DRESSINGS) IMPLANT
BENZOIN TINCTURE PRP APPL 2/3 (GAUZE/BANDAGES/DRESSINGS) IMPLANT
BINDER BREAST LRG (GAUZE/BANDAGES/DRESSINGS) IMPLANT
BINDER BREAST MEDIUM (GAUZE/BANDAGES/DRESSINGS) IMPLANT
BINDER BREAST XLRG (GAUZE/BANDAGES/DRESSINGS) ×2 IMPLANT
BINDER BREAST XXLRG (GAUZE/BANDAGES/DRESSINGS) IMPLANT
BLADE HEX COATED 2.75 (ELECTRODE) ×2 IMPLANT
BLADE SURG 15 STRL LF DISP TIS (BLADE) ×2 IMPLANT
BLADE SURG 15 STRL SS (BLADE) ×4
CANISTER SUCT 1200ML W/VALVE (MISCELLANEOUS) ×2 IMPLANT
CHLORAPREP W/TINT 26ML (MISCELLANEOUS) ×2 IMPLANT
CLIP APPLIE 9.375 MED OPEN (MISCELLANEOUS) IMPLANT
COVER MAYO STAND STRL (DRAPES) ×2 IMPLANT
COVER TABLE BACK 60X90 (DRAPES) ×2 IMPLANT
DECANTER SPIKE VIAL GLASS SM (MISCELLANEOUS) IMPLANT
DERMABOND ADVANCED (GAUZE/BANDAGES/DRESSINGS) ×1
DERMABOND ADVANCED .7 DNX12 (GAUZE/BANDAGES/DRESSINGS) ×1 IMPLANT
DEVICE DUBIN W/COMP PLATE 8390 (MISCELLANEOUS) ×2 IMPLANT
DRAPE LAPAROSCOPIC ABDOMINAL (DRAPES) IMPLANT
DRAPE LAPAROTOMY TRNSV 102X78 (DRAPE) IMPLANT
DRAPE PED LAPAROTOMY (DRAPES) ×2 IMPLANT
DRAPE UTILITY XL STRL (DRAPES) ×2 IMPLANT
ELECT REM PT RETURN 9FT ADLT (ELECTROSURGICAL) ×2
ELECTRODE REM PT RTRN 9FT ADLT (ELECTROSURGICAL) ×1 IMPLANT
GAUZE SPONGE 4X4 16PLY XRAY LF (GAUZE/BANDAGES/DRESSINGS) IMPLANT
GLOVE BIO SURGEON STRL SZ7 (GLOVE) ×4 IMPLANT
GLOVE BIOGEL PI IND STRL 7.5 (GLOVE) ×2 IMPLANT
GLOVE BIOGEL PI INDICATOR 7.5 (GLOVE) ×2
GLOVE EUDERMIC 7 POWDERFREE (GLOVE) ×4 IMPLANT
GOWN STRL REUS W/ TWL LRG LVL3 (GOWN DISPOSABLE) ×2 IMPLANT
GOWN STRL REUS W/ TWL XL LVL3 (GOWN DISPOSABLE) ×2 IMPLANT
GOWN STRL REUS W/TWL LRG LVL3 (GOWN DISPOSABLE) ×2
GOWN STRL REUS W/TWL XL LVL3 (GOWN DISPOSABLE) ×4
KIT MARKER MARGIN INK (KITS) ×2 IMPLANT
NEEDLE HYPO 22GX1.5 SAFETY (NEEDLE) IMPLANT
NEEDLE HYPO 25X1 1.5 SAFETY (NEEDLE) ×2 IMPLANT
NS IRRIG 1000ML POUR BTL (IV SOLUTION) ×2 IMPLANT
PACK BASIN DAY SURGERY FS (CUSTOM PROCEDURE TRAY) ×2 IMPLANT
PAD ABD 8X10 STRL (GAUZE/BANDAGES/DRESSINGS) ×4 IMPLANT
PENCIL BUTTON HOLSTER BLD 10FT (ELECTRODE) ×2 IMPLANT
SLEEVE SCD COMPRESS KNEE MED (MISCELLANEOUS) ×2 IMPLANT
SPONGE GAUZE 4X4 12PLY STER LF (GAUZE/BANDAGES/DRESSINGS) IMPLANT
SPONGE LAP 4X18 X RAY DECT (DISPOSABLE) ×2 IMPLANT
STRIP CLOSURE SKIN 1/2X4 (GAUZE/BANDAGES/DRESSINGS) IMPLANT
SUT ETHILON 4 0 PS 2 18 (SUTURE) IMPLANT
SUT MNCRL AB 4-0 PS2 18 (SUTURE) ×4 IMPLANT
SUT SILK 2 0 SH (SUTURE) ×2 IMPLANT
SUT VIC AB 2-0 SH 27 (SUTURE)
SUT VIC AB 2-0 SH 27XBRD (SUTURE) IMPLANT
SUT VIC AB 4-0 P-3 18XBRD (SUTURE) IMPLANT
SUT VIC AB 4-0 P3 18 (SUTURE)
SUT VICRYL 3-0 CR8 SH (SUTURE) ×4 IMPLANT
SYR BULB 3OZ (MISCELLANEOUS) ×2 IMPLANT
SYR CONTROL 10ML LL (SYRINGE) ×2 IMPLANT
TAPE HYPAFIX 4 X10 (GAUZE/BANDAGES/DRESSINGS) IMPLANT
TOWEL OR NON WOVEN STRL DISP B (DISPOSABLE) ×2 IMPLANT
TUBE CONNECTING 20X1/4 (TUBING) ×2 IMPLANT
YANKAUER SUCT BULB TIP NO VENT (SUCTIONS) ×2 IMPLANT

## 2013-06-03 NOTE — Anesthesia Preprocedure Evaluation (Signed)
Anesthesia Evaluation  Patient identified by MRN, date of birth, ID band Patient awake    Reviewed: Allergy & Precautions, H&P , NPO status , Patient's Chart, lab work & pertinent test results  History of Anesthesia Complications (+) PONV  Airway Mallampati: I TM Distance: >3 FB Neck ROM: Full    Dental   Pulmonary          Cardiovascular hypertension, Pt. on medications     Neuro/Psych    GI/Hepatic   Endo/Other    Renal/GU      Musculoskeletal   Abdominal   Peds  Hematology   Anesthesia Other Findings   Reproductive/Obstetrics                           Anesthesia Physical Anesthesia Plan  ASA: II  Anesthesia Plan: General   Post-op Pain Management:    Induction: Intravenous  Airway Management Planned: LMA  Additional Equipment:   Intra-op Plan:   Post-operative Plan: Extubation in OR  Informed Consent: I have reviewed the patients History and Physical, chart, labs and discussed the procedure including the risks, benefits and alternatives for the proposed anesthesia with the patient or authorized representative who has indicated his/her understanding and acceptance.     Plan Discussed with: CRNA and Surgeon  Anesthesia Plan Comments:         Anesthesia Quick Evaluation

## 2013-06-03 NOTE — Interval H&P Note (Signed)
History and Physical Interval Note:  06/03/2013 9:58 AM  Ann Fernandez  has presented today for surgery, with the diagnosis of ADH left breast   The goals and the  various methods of treatment have been discussed with the patient and family. After consideration of risks, benefits and other options for treatment, the patient has consented to  Procedure(s): PARTIAL MASTECTOMY WITH NEEDLE LOCALIZATION (Left) as a surgical intervention .  The patient's history has been reviewed, patient examined today, no change in status, stable for surgery.  I have reviewed the patient's chart and labs.  Questions were answered to the patient's satisfaction.     [name]

## 2013-06-03 NOTE — Anesthesia Procedure Notes (Signed)
Procedure Name: LMA Insertion Date/Time: 06/03/2013 10:12 AM Performed by: Neima Lacross Pre-anesthesia Checklist: Patient identified, Emergency Drugs available, Suction available and Patient being monitored Patient Re-evaluated:Patient Re-evaluated prior to inductionOxygen Delivery Method: Circle System Utilized Preoxygenation: Pre-oxygenation with 100% oxygen Intubation Type: IV induction Ventilation: Mask ventilation without difficulty LMA: LMA inserted LMA Size: 4.0 Number of attempts: 1 Airway Equipment and Method: bite block Placement Confirmation: positive ETCO2 Tube secured with: Tape Dental Injury: Teeth and Oropharynx as per pre-operative assessment

## 2013-06-03 NOTE — Transfer of Care (Signed)
Immediate Anesthesia Transfer of Care Note  Patient: Ann Fernandez  Procedure(s) Performed: Procedure(s): PARTIAL MASTECTOMY WITH NEEDLE LOCALIZATION (Left)  Patient Location: PACU  Anesthesia Type:General  Level of Consciousness: awake and patient cooperative  Airway & Oxygen Therapy: Patient Spontanous Breathing and Patient connected to face mask oxygen  Post-op Assessment: Report given to PACU RN and Post -op Vital signs reviewed and stable  Post vital signs: Reviewed and stable  Complications: No apparent anesthesia complications

## 2013-06-03 NOTE — Op Note (Signed)
Patient Name:           Ann Fernandez   Date of Surgery:        06/03/2013  Pre op Diagnosis:      Atypical ductal hyperplasia, left breast, upper outer quadrant  Post op Diagnosis:    Same  Procedure:                 Left partial mastectomy with needle localization, we excision medial margin  Surgeon:                     Edsel Petrin. Dalbert Batman, M.D., FACS  Assistant:                      None  Operative Indications:   Ann Fernandez is a 58 y.o. female. She is referred by Dr. Lenn Sink at [facility] for evaluation and management of a suspicious area of microcalcifications in the left breast, upper outer quadrant, which revealed atypical ductal hyperplasia on image guided biopsy.  The patient has no prior history of breast problems.  Mammograms and ultrasounds were done bilaterally. There was no imaging abnormality on the right. On the left, however, there was a highly suspicious area of microcalcifications at the 1:00 position, 1 cm in diameter. 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 She was referred for excision to rule out occult in situ carcinoma.   Family history is negative for breast or ovarian cancer. The patient is married has 2 children denies alcohol or tobacco.     Operative Findings:       The localizing wire entered from the far lateral aspect of the left breast and was directed medially. The tip of the wire went right up to the marker clip. The specimen mammogram showed that the wire and the marker clip were within the specimen but it was close to the medial edge. Because of this I reexcised the medial margin.  Procedure in Detail:          Dr. Isaiah Blakes performed wire localization the wire was placed well. She she was brought to the operating room and  LMA general anesthesia was induced. The left breast was prepped and draped in a sterile fashion. Surgical time out was performed. Intravenous antibiotics were given.  After review of the wire insertion site and the localization mammograms I decided to make a curvilinear circumareolar incision in the lateral breast at about the 2:30 position. Dissection was carried down to the breast tissue around the localizing wire. The specimen was removed cleanly and marked with silk sutures and the 6 color ink kit. Specimen mammogram showed that we had removed the specimen as described above it was close to the medial margin. We marked the primary specimen I then reexcised the medial margin and marked it and it will be sent as a separate specimen to the lab. Hemostasis was excellent.. The wound was irrigated with saline. The breast tissues were closed in layers with interrupted 3-0 Vicryl and skin closed with a running subcuticular suture of 4-0 Monocryl and Dermabond. Breast binder and ice pack were placed. The patient was taken to recovery in stable condition. EBL 15 cc. Counts correct. Consultations none.     Edsel Petrin. Dalbert Batman, M.D., FACS General and Minimally Invasive Surgery Breast and Colorectal Surgery  06/03/2013 11:12 AM

## 2013-06-03 NOTE — Anesthesia Postprocedure Evaluation (Signed)
Anesthesia Post Note  Patient: Ann Fernandez  Procedure(s) Performed: Procedure(s) (LRB): PARTIAL MASTECTOMY WITH NEEDLE LOCALIZATION (Left)  Anesthesia type: general  Patient location: PACU  Post pain: Pain level controlled  Post assessment: Patient's Cardiovascular Status Stable  Last Vitals:  Filed Vitals:   06/03/13 1218  BP: 143/71  Pulse:   Temp: 36.4 C  Resp: 16    Post vital signs: Reviewed and stable  Level of consciousness: sedated  Complications: No apparent anesthesia complications

## 2013-06-03 NOTE — Discharge Instructions (Signed)
[facility] °Office Phone Number [phone] ° °BREAST BIOPSY/ PARTIAL MASTECTOMY: POST OP INSTRUCTIONS ° °Always review your discharge instruction sheet given to you by the facility where your surgery was performed. ° °IF YOU HAVE DISABILITY OR FAMILY LEAVE FORMS, YOU MUST BRING THEM TO THE OFFICE FOR PROCESSING.  DO NOT GIVE THEM TO YOUR DOCTOR. ° °1. A prescription for pain medication may be given to you upon discharge.  Take your pain medication as prescribed, if needed.  If narcotic pain medicine is not needed, then you may take acetaminophen (Tylenol) or ibuprofen (Advil) as needed. °2. Take your usually prescribed medications unless otherwise directed °3. If you need a refill on your pain medication, please contact your pharmacy.  They will contact our office to request authorization.  Prescriptions will not be filled after 5pm or on week-ends. °4. You should eat very light the first 24 hours after surgery, such as soup, crackers, pudding, etc.  Resume your normal diet the day after surgery. °5. Most patients will experience some swelling and bruising in the breast.  Ice packs and a good support bra will help.  Swelling and bruising can take several days to resolve.  °6. It is common to experience some constipation if taking pain medication after surgery.  Increasing fluid intake and taking a stool softener will usually help or prevent this problem from occurring.  A mild laxative (Milk of Magnesia or Miralax) should be taken according to package directions if there are no bowel movements after 48 hours. °7. Unless discharge instructions indicate otherwise, you may remove your bandages 24-48 hours after surgery, and you may shower at that time.  You may have steri-strips (small skin tapes) in place directly over the incision.  These strips should be left on the skin for 7-10 days.  If your surgeon used skin glue on the incision, you may shower in 24 hours.  The glue will flake off over the  next 2-3 weeks.  Any sutures or staples will be removed at the office during your follow-up visit. °8. ACTIVITIES:  You may resume regular daily activities (gradually increasing) beginning the next day.  Wearing a good support bra or sports bra minimizes pain and swelling.  You may have sexual intercourse when it is comfortable. °a. You may drive when you no longer are taking prescription pain medication, you can comfortably wear a seatbelt, and you can safely maneuver your car and apply brakes. °b. RETURN TO WORK:  ______________________________________________________________________________________ °9. You should see your doctor in the office for a follow-up appointment approximately two weeks after your surgery.  Your doctor’s nurse will typically make your follow-up appointment when she calls you with your pathology report.  Expect your pathology report 2-3 business days after your surgery.  You may call to check if you do not hear from us after three days. °10. OTHER INSTRUCTIONS: _______________________________________________________________________________________________ _____________________________________________________________________________________________________________________________________ °_____________________________________________________________________________________________________________________________________ °_____________________________________________________________________________________________________________________________________ ° °WHEN TO CALL YOUR DOCTOR: °1. Fever over 101.0 °2. Nausea and/or vomiting. °3. Extreme swelling or bruising. °4. Continued bleeding from incision. °5. Increased pain, redness, or drainage from the incision. ° °The clinic staff is available to answer your questions during regular business hours.  Please don’t hesitate to call and ask to speak to one of the nurses for clinical concerns.  If you have a medical emergency, go to the nearest  emergency room or call 911.  A surgeon from [facility] is always on call at the hospital. ° °For further questions, please visit [email or web address]  ° ° °  Post Anesthesia Home Care Instructions ° °Activity: °Get plenty of rest for the remainder of the day. A responsible adult should stay with you for 24 hours following the procedure.  °For the next 24 hours, DO NOT: °-Drive a car °-Operate machinery °-Drink alcoholic beverages °-Take any medication unless instructed by your physician °-Make any legal decisions or sign important papers. ° °Meals: °Start with liquid foods such as gelatin or soup. Progress to regular foods as tolerated. Avoid greasy, spicy, heavy foods. If nausea and/or vomiting occur, drink only clear liquids until the nausea and/or vomiting subsides. Call your physician if vomiting continues. ° °Special Instructions/Symptoms: °Your throat may feel dry or sore from the anesthesia or the breathing tube placed in your throat during surgery. If this causes discomfort, gargle with warm salt water. The discomfort should disappear within 24 hours. ° °

## 2013-06-06 ENCOUNTER — Encounter (HOSPITAL_BASED_OUTPATIENT_CLINIC_OR_DEPARTMENT_OTHER): Payer: Self-pay | Admitting: General Surgery

## 2013-06-07 ENCOUNTER — Encounter (INDEPENDENT_AMBULATORY_CARE_PROVIDER_SITE_OTHER): Payer: Self-pay

## 2013-06-07 ENCOUNTER — Telehealth (INDEPENDENT_AMBULATORY_CARE_PROVIDER_SITE_OTHER): Payer: Self-pay

## 2013-06-07 NOTE — Telephone Encounter (Signed)
Discussed pathology results with patient.  No evidence of cancer.  I told her Dr Dalbert Batman will discuss with her further at her next visit and we will give her a copy of the report.  She is pleased.  She has her po appointment.

## 2013-06-07 NOTE — Telephone Encounter (Signed)
Message copied by Gweneth Fritter on Tue Jun 07, 2013  4:36 PM ------      Message from: [name]      Created: Mon Jun 06, 2013  3:41 PM       Please call pathology report the patient. The final diagnosis is atypical ductal hyperplasia, margins negative. No evidence of cancer. I will discuss this with the patient further at her next office visit.            hmi ------

## 2013-06-21 ENCOUNTER — Encounter (INDEPENDENT_AMBULATORY_CARE_PROVIDER_SITE_OTHER): Payer: Self-pay | Admitting: General Surgery

## 2013-06-21 ENCOUNTER — Ambulatory Visit (INDEPENDENT_AMBULATORY_CARE_PROVIDER_SITE_OTHER): Payer: BC Managed Care – PPO | Admitting: General Surgery

## 2013-06-21 VITALS — BP 146/90 | HR 72 | Temp 98.4°F | Resp 14 | Ht 65.0 in | Wt 210.4 lb

## 2013-06-21 DIAGNOSIS — N6099 Unspecified benign mammary dysplasia of unspecified breast: Secondary | ICD-10-CM

## 2013-06-21 DIAGNOSIS — N6089 Other benign mammary dysplasias of unspecified breast: Secondary | ICD-10-CM

## 2013-06-21 NOTE — Patient Instructions (Signed)
You are healing very nicely from your left breast lumpectomy.  We discussed the pathology report and you have been given a copy. It shows completely excised atypical ductal hyperplasia.  Because of this diagnosis and family history of breast cancer in a second line relative, consideration is given to referral to a high risk clinic. You state that you would  like to do that, and so we will make that referral.  You are advised to get annual breast exam and annual mammography.  Return to see Dr. Dalbert Batman as needed.

## 2013-06-21 NOTE — Progress Notes (Signed)
Patient ID: Ann Fernandez, female   DOB: 09-May-1956, 58 y.o.   MRN: [id number] History: This patient underwent left lumpectomy with needle localization and reexcision of medial margin on 06/03/2013. Pathology report is atypical ductal hyperplasia, completely excised with negative margins. I discussed this with her and gave her a copy of the pathology report. She has no complaints about healing. She did tell me that she found out that a first maternal cousin was treated for breast cancer.  Exam: patient is in good spirits. No distress. Left breast looks good. Incision upper-outer quadrant healing normally. Excellent contour and cosmesis. No signs of infection or hematoma  Assessment: atypical ductal hyperplasia left breast, upper outer quadrant. Recovering uneventfully following excision  Plan: We discussed her pathology And her family history and her risk assessment. She is interested in referral to the high risk clinic and so we will make that referral Annual mammography and annual breast exam Return see me as needed.   Edsel Petrin. Dalbert Batman, M.D., [facility] General and Minimally invasive Surgery Breast and Colorectal Surgery Office:   [phone] Pager:   [phone]

## 2013-06-22 ENCOUNTER — Telehealth: Payer: Self-pay | Admitting: Internal Medicine

## 2013-06-22 DIAGNOSIS — N6099 Unspecified benign mammary dysplasia of unspecified breast: Secondary | ICD-10-CM

## 2013-06-22 NOTE — Telephone Encounter (Signed)
[facility] is requesting a referral for this patient, but didn't state for what.

## 2013-06-23 ENCOUNTER — Telehealth: Payer: Self-pay | Admitting: Obstetrics and Gynecology

## 2013-06-23 ENCOUNTER — Encounter: Payer: Self-pay | Admitting: *Deleted

## 2013-06-23 MED ORDER — VITAMIN D (ERGOCALCIFEROL) 1.25 MG (50000 UNIT) PO CAPS: 50000.0000 [IU] | ORAL_CAPSULE | ORAL | Status: DC

## 2013-06-23 NOTE — Telephone Encounter (Signed)
Last AEX 04/2013.  Called pt and was notified that we need to check her vit D levels bc we didn't check them last time she was here. She states she talked with Dr. Quincy Simmonds at her visit and she stated she need to keep taking Vit D 50,000 bc she has a lot of problems with degenerative disc disease. Patient states she will go to her PCP in March and will have Vitamin D check then. Told pt we will refill Vitamin D until March and we will see if she needs rx after that then. Patient agreed.

## 2013-06-23 NOTE — Progress Notes (Signed)
Received referral in EPIC and its requesting for the pt to see Dr. Humphrey Rolls in the [facility].  I emailed Tiff in [facility] to make her aware of the referral and I emailed Clarise Cruz at [facility] to make her aware that Tiff or one of her staff members would get pt scheduled.

## 2013-06-23 NOTE — Telephone Encounter (Signed)
Patient needs refill of Vitamin D, Ergocalciferol, (DRISDOL) 50000 UNITS CAPS capsule  Take 50,000 Units by mouth every 7 (seven) days. , Informant: Self, Last Dose: Not Recorded  Note written 05/12/2013 1036: sunday (Edit Note)   [place] [phone]

## 2013-06-23 NOTE — Telephone Encounter (Signed)
Thayer Headings at [employer] said its for insurance purposes.  They need referral back dated to 06/03/13, referral order placed

## 2013-06-27 ENCOUNTER — Telehealth: Payer: Self-pay | Admitting: Oncology

## 2013-06-27 NOTE — Telephone Encounter (Signed)
C/D 06/27/13 for appt. 08/16/13

## 2013-06-27 NOTE — Telephone Encounter (Signed)
PATIENT SCHEDULED FOR HIGH RISK CLINIC 03/13 @ 12 PER MD.

## 2013-06-29 ENCOUNTER — Encounter (INDEPENDENT_AMBULATORY_CARE_PROVIDER_SITE_OTHER): Payer: Self-pay

## 2013-08-05 ENCOUNTER — Encounter: Payer: Self-pay | Admitting: Oncology

## 2013-08-05 ENCOUNTER — Telehealth: Payer: Self-pay | Admitting: Oncology

## 2013-08-05 ENCOUNTER — Ambulatory Visit (HOSPITAL_BASED_OUTPATIENT_CLINIC_OR_DEPARTMENT_OTHER): Payer: BC Managed Care – PPO | Admitting: Oncology

## 2013-08-05 VITALS — BP 149/82 | HR 63 | Temp 97.6°F | Resp 20 | Ht 65.0 in | Wt 211.9 lb

## 2013-08-05 DIAGNOSIS — N6089 Other benign mammary dysplasias of unspecified breast: Secondary | ICD-10-CM

## 2013-08-05 DIAGNOSIS — N6099 Unspecified benign mammary dysplasia of unspecified breast: Secondary | ICD-10-CM

## 2013-08-05 MED ORDER — TAMOXIFEN CITRATE 20 MG PO TABS: 20.0000 mg | ORAL_TABLET | Freq: Every day | ORAL | Status: AC

## 2013-08-05 NOTE — Progress Notes (Signed)
[facility] New Patient Evaluation  Name: Ann Fernandez            Date: 08/05/2013 MRN: [id number]                DOB: 1956/05/14  CC:  Dr. Phoebe Sharps Dr. Fanny Skates  REFERRING PHYSICIAN: Dr. Fanny Skates  REASON FOR VISIT: 58 year old female with new diagnosis of atypical ductal hyperplasia. Patient is seen in the high-risk clinic for discussion of future breast cancer risk reduction.  HISTORY OF PRESENT ILLNESS: Ann Fernandez is a 58 y.o. female without significant oncologic history. She recently was seen by her gynecologist for a routine examination she was noted to have lumpiness in the right breast. She had imaging studies performed including mammograms and ultrasounds. There were no imaging abnormalities on the right. However on the left she was noted to have a suspicious area of microcalcifications measuring 1 cm at the 1:00 position. 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 She was subsequently seen by Dr. Fanny Skates. She underwent a lumpectomy of the left breast on 06/03/2013. The final pathology revealed atypical ductal hyperplasia. One node was negative for metastatic disease. Patient was subsequently referred to the high-risk clinic for discussion of future breast cancer risk reduction. She is without any complaints. She is recovered from her surgery very nicely. I have reviewed her past medical history her surgical history as well as her family history in detail.   PAST MEDICAL HISTORY:  has a past medical history of Allergy; Hyperlipidemia; Hypertension; Diverticulosis of colon; DDD (degenerative disc disease); IBS (irritable bowel syndrome); PONV (postoperative nausea and vomiting); Anxiety; Wears glasses; and GERD (gastroesophageal reflux disease).  PAST SURGICAL HISTORY:  Past Surgical History  Procedure Laterality Date  . Cervical laminectomy    .  Sinus irrigation    . Trigger finger release    . Knee arthroscopy    . Cyst removal trunk      LOWER BACK  . Abdominal hysterectomy  1994    TVH for DUB--ovaries retained    . Knee arthroscopy Right 05/25/2013    Procedure: RIGHT ARTHROSCOPY KNEE, PARTIAL MEDIAL MENISCECTOMY, CHONDROPLASTY, PARTIAL LATERAL MENISCECTOMY;  Surgeon: Gearlean Alf, MD;  Location: [facility];  Service: Orthopedics;  Laterality: Right;  . Back surgery  2011    lumb cyst  . Tonsillectomy    . Wisdom tooth extraction    . Dilation and curettage of uterus    . Partial mastectomy with needle localization Left 06/03/2013    Procedure: PARTIAL MASTECTOMY WITH NEEDLE LOCALIZATION;  Surgeon: [name], MD;  Location: [facility];  Service: General;  Laterality: Left;  . Breast surgery        CURRENT MEDICATIONS: Ms. Harnack does not currently have medications on file.  ALLERGIES: Amlodipine besylate; Darvocet; Demerol; Hydrochlorothiazide w-triamterene; Lipitor; Lisinopril; Meperidine hcl; Penicillins; Simvastatin; and Sulfamethoxazole  SOCIAL HISTORY:  reports that she has never smoked. She has never used smokeless tobacco. She reports that she does not drink alcohol or use illicit drugs.  HEALTH HABITS: Vitamins: calcium and Vitamin D Supplements: no Alternative Therapies: no Adverse environmental exposure: no Servings of fruit and vegetables/day: 4 Servings of meat/day: 1 Exercises regularly:        4 - 5 miles per day prior to knee surgery          Smoker/nonsmoker: no Alcohol: no Number of alcoholic beverages/week: none  REPRODUCTIVE HISTORY:  Menarche age: 32 Gravida:    2   Para: 2 First Live Birth: 61 Number of live births: 2 Breast fed: no Took fertility meds:  no Menses: post meno Oral Contraceptives:   no      # of years Menopause: surgical  Age 55 HRT N Currently N TType:                                   # years Sexually transmitted disease:    FAMILY HISTORY:   family history includes COPD in her father; Cancer in her mother; Colon cancer in her mother; Deep vein thrombosis in her mother; Heart disease in her brother, father, and mother; Lung disease in her mother; Osteoarthritis in her father; Pulmonary embolism in her brother; Rheum arthritis in her daughter and father.  HEALTH MAINTENANCE: Last mammogram: Last clinical breast exam: Performs self breast exam: Last Pap Smear: Colonoscopy:  Last skin exam:  REVIEW OF SYSTEMS:  General: Negative for fever, chills, night sweats,  loss of appetite or weight loss. HEENT: Negative for headaches, sore  throat, difficulty swallowing, blurred vision or problem with hearing or  sinus congestion. Respiratory: Negative for shortness of breath, cough  or dyspnea on exertion. Cardiovascular: Negative for chest pain,  palpitations or pedal edema. GI: Negative for nausea, vomiting,  diarrhea, constipation, change in bowel habits or blood in the stool.  No jaundice. GU: Negative for painful or frequent urination, change in  color of urine, or decreased urinary stream. Integumentary: Negative  for skin rashes or other suspicious skin lesions. Hematologic: Negative  for easy bruisability or bleeding. Musculoskeletal: Negative for  complaints of pain, arthralgias, arthritis or myalgias.  Neurological/psychiatric: Negative for numbness, focal weakness,  balance problems or coordination difficulties. No depression or mood swings.  Breast: No self detected abnormalities in the breast. No nipple discharge, masses or redness of the skin.   PHYSICAL EXAM: BP 149/82  Pulse 63  Temp(Src) 97.6 F (36.4 C) (Oral)  Resp 20  Ht 5\' 5"  (1.651 m)  Wt 211 lb 14.4 oz (96.117 kg)  BMI 35.26 kg/m2 GENERAL: Well developed, well nourished, in no acute distress.  EENT: No ocular or oral lesions. No stomatitis.  RESPIRATORY: Lungs are clear to auscultation bilaterally with normal respiratory movement and no accessory muscle  use. CARDIAC: No murmur, rub or tachycardia. No upper or lower extremity edema.  GI: Abdomen is soft, no palpable hepatosplenomegaly. No fluid wave. No tenderness. Musculoskeletal: No kyphosis, no tenderness over the spine, ribs or hips. Lymph: No cervical, infraclavicular, axillary or inguinal adenopathy. Neuro: No focal neurological deficits. Psych: Alert and oriented X 3, appropriate mood and affect.  BREAST EXAM: In the supine position, with the right arm over the head, right nipple is everted. No periareolar edema or nipple discharge. No mass in any quadrant or subareolar region. No redness of the skin. No right axillary adenopathy. With the left arm over the head, left nipple is everted. No periareolar edema or nipple discharge. No mass in any quadrant or subareolar region. No redness of the skin. No left axillary adenopathy.    ASSESSMENT/PLAN:  Patient and I discussed her pathology and radiology in detail. I went over the pathology with the patient in detail. We discussed the benign and malignant disease of the breast as well atypical lesions of the breast.   We discussed different risk reducing strategies for future breast cancer  risk. We discussed chemoprevention and lifestyle modification. We discussed role of ongoing surveillance with mammograms versus MRIs. Patient would be a good candidate for chemoprevention with  tamoxifen or aromatase inhibitors. We discussed different medications available. We discussed their particular side effects. With tamoxifen side effects would include but not limited to cataract axilla radiation, thrombosis, uterine malignancy, hot flashes mood swings. With Evista patient would and could experience mood swings hot flashes aches and pains and possibly thrombosis. Aromatase inhibitors side effects including but not limiting to bone loss, aches and pains, hyperlipidemia, hot flashes and mood swings.  Lifestyle modification: We discussed different interventions  exercise at least 6 days a week including both aerobic as well as weight-bearing exercises and strength training. He discussed dietary modification including increasing number of servings of fruit and vegetables as well as decreased in number of servings of meat. We discussed the importance of maintaining a good BMI. Certainly patient eliminating or limiting alcohol consumption. We discussed smoking cessation are avoiding starting smoking habits.  Surveillance: Patient certainly would be a good candidate for ongoing mammograms on a yearly basis. Certainly she could benefit from a 3-D mammogram. We discussed self breast examination as well as ongoing clinical examination.   Chemoprevention: Patient was given a prescription for tamoxifen. Risks benefits and side effects were discussed with her. Literature was given to her and reviewed with her. A prescription for TAMOXIFEN 20 MG was given and sent to her pharmacy. .  Followup: Patient will be seen back in 6 months time in followup. We will check her CBC as well as cmet at the time.  The length of time of the face-to-face encounter was 60    minutes. More than 50% of time was spent counseling and coordination of care.  Marcy Panning, MD Medical/Oncology [facility] [phone] (beeper) [phone] (Office)

## 2013-08-05 NOTE — Telephone Encounter (Signed)
, °

## 2013-08-05 NOTE — Patient Instructions (Signed)

## 2013-08-12 ENCOUNTER — Other Ambulatory Visit: Payer: BC Managed Care – PPO

## 2013-08-15 ENCOUNTER — Other Ambulatory Visit (INDEPENDENT_AMBULATORY_CARE_PROVIDER_SITE_OTHER): Payer: BC Managed Care – PPO

## 2013-08-15 DIAGNOSIS — Z0000 Encounter for general adult medical examination without abnormal findings: Secondary | ICD-10-CM

## 2013-08-15 LAB — POCT URINALYSIS DIPSTICK
Bilirubin, UA: NEGATIVE
Blood, UA: NEGATIVE
Glucose, UA: NEGATIVE
Ketones, UA: NEGATIVE
LEUKOCYTES UA: NEGATIVE
NITRITE UA: NEGATIVE
PROTEIN UA: NEGATIVE
Spec Grav, UA: 1.01
UROBILINOGEN UA: 0.2
pH, UA: 7

## 2013-08-15 LAB — CBC WITH DIFFERENTIAL/PLATELET
Basophils Absolute: 0 10*3/uL (ref 0.0–0.1)
Basophils Relative: 0.5 % (ref 0.0–3.0)
EOS PCT: 5.3 % — AB (ref 0.0–5.0)
Eosinophils Absolute: 0.3 10*3/uL (ref 0.0–0.7)
HCT: 42.6 % (ref 36.0–46.0)
Hemoglobin: 14.3 g/dL (ref 12.0–15.0)
LYMPHS ABS: 2 10*3/uL (ref 0.7–4.0)
Lymphocytes Relative: 31.6 % (ref 12.0–46.0)
MCHC: 33.5 g/dL (ref 30.0–36.0)
MCV: 91.9 fl (ref 78.0–100.0)
Monocytes Absolute: 0.5 10*3/uL (ref 0.1–1.0)
Monocytes Relative: 7.5 % (ref 3.0–12.0)
Neutro Abs: 3.4 10*3/uL (ref 1.4–7.7)
Neutrophils Relative %: 55.1 % (ref 43.0–77.0)
Platelets: 208 10*3/uL (ref 150.0–400.0)
RBC: 4.64 Mil/uL (ref 3.87–5.11)
RDW: 14 % (ref 11.5–14.6)
WBC: 6.2 10*3/uL (ref 4.5–10.5)

## 2013-08-15 LAB — HEPATIC FUNCTION PANEL
ALBUMIN: 4.2 g/dL (ref 3.5–5.2)
ALT: 45 U/L — ABNORMAL HIGH (ref 0–35)
AST: 28 U/L (ref 0–37)
Alkaline Phosphatase: 64 U/L (ref 39–117)
BILIRUBIN TOTAL: 1.2 mg/dL (ref 0.3–1.2)
Bilirubin, Direct: 0.1 mg/dL (ref 0.0–0.3)
Total Protein: 6.7 g/dL (ref 6.0–8.3)

## 2013-08-15 LAB — LIPID PANEL
CHOLESTEROL: 227 mg/dL — AB (ref 0–200)
HDL: 40.7 mg/dL (ref >39.00)
LDL CALC: 146 mg/dL — AB (ref 0–99)
Total CHOL/HDL Ratio: 6
Triglycerides: 203 mg/dL — ABNORMAL HIGH (ref 0.0–149.0)
VLDL: 40.6 mg/dL — AB (ref 0.0–40.0)

## 2013-08-15 LAB — BASIC METABOLIC PANEL
BUN: 12 mg/dL (ref 6–23)
CHLORIDE: 104 meq/L (ref 96–112)
CO2: 28 mEq/L (ref 19–32)
Calcium: 9.5 mg/dL (ref 8.4–10.5)
Creatinine, Ser: 0.6 mg/dL (ref 0.4–1.2)
GFR: 101.46 mL/min (ref >60.00)
GLUCOSE: 90 mg/dL (ref 70–99)
POTASSIUM: 4 meq/L (ref 3.5–5.1)
SODIUM: 140 meq/L (ref 135–145)

## 2013-08-15 LAB — TSH: TSH: 1.41 u[IU]/mL (ref 0.35–5.50)

## 2013-08-16 ENCOUNTER — Encounter: Payer: BC Managed Care – PPO | Admitting: Oncology

## 2013-08-19 ENCOUNTER — Ambulatory Visit (INDEPENDENT_AMBULATORY_CARE_PROVIDER_SITE_OTHER): Payer: BC Managed Care – PPO | Admitting: Internal Medicine

## 2013-08-19 VITALS — BP 135/80 | Temp 98.4°F | Ht 64.5 in | Wt 213.0 lb

## 2013-08-19 DIAGNOSIS — N6089 Other benign mammary dysplasias of unspecified breast: Secondary | ICD-10-CM

## 2013-08-19 DIAGNOSIS — Z0000 Encounter for general adult medical examination without abnormal findings: Secondary | ICD-10-CM

## 2013-08-19 DIAGNOSIS — N6099 Unspecified benign mammary dysplasia of unspecified breast: Secondary | ICD-10-CM

## 2013-08-19 NOTE — Progress Notes (Signed)
Pre visit review using our clinic review tool, if applicable. No additional management support is needed unless otherwise documented below in the visit note. 

## 2013-08-19 NOTE — Progress Notes (Signed)
Right knee pain- sees dr. Wynelle Link  htn- tolerating meds  Lipids- tolerating meds  Atypical ductal hyperplasia- started tamoxifen 4 days ago.  Past Medical History  Diagnosis Date  . Allergy   . Hyperlipidemia   . Hypertension   . Diverticulosis of colon   . DDD (degenerative disc disease)   . IBS (irritable bowel syndrome)   . PONV (postoperative nausea and vomiting)   . Anxiety     regarding  upcoming breast surgery  . Wears glasses   . GERD (gastroesophageal reflux disease)     History   Social History  . Marital Status: Married    Spouse Name: N/A    Number of Children: N/A  . Years of Education: N/A   Occupational History  . Not on file.   Social History Main Topics  . Smoking status: Never Smoker   . Smokeless tobacco: Never Used  . Alcohol Use: No  . Drug Use: No  . Sexual Activity: Yes    Partners: Male     Comment: [facility]   Other Topics Concern  . Not on file   Social History Narrative  . No narrative on file    Past Surgical History  Procedure Laterality Date  . Cervical laminectomy    . Sinus irrigation    . Trigger finger release    . Knee arthroscopy    . Cyst removal trunk      LOWER BACK  . Abdominal hysterectomy  1994    TVH for DUB--ovaries retained    . Knee arthroscopy Right 05/25/2013    Procedure: RIGHT ARTHROSCOPY KNEE, PARTIAL MEDIAL MENISCECTOMY, CHONDROPLASTY, PARTIAL LATERAL MENISCECTOMY;  Surgeon: Gearlean Alf, MD;  Location: [facility];  Service: Orthopedics;  Laterality: Right;  . Back surgery  2011    lumb cyst  . Tonsillectomy    . Wisdom tooth extraction    . Dilation and curettage of uterus    . Partial mastectomy with needle localization Left 06/03/2013    Procedure: PARTIAL MASTECTOMY WITH NEEDLE LOCALIZATION;  Surgeon: [name], MD;  Location: [facility];  Service: General;  Laterality: Left;  . Breast surgery      Family History  Problem Relation Age of Onset  . Heart disease Mother   .  Deep vein thrombosis Mother   . Colon cancer Mother   . Cancer Mother     rectal cancer  . Lung disease Mother   . Heart disease Father   . COPD Father   . Rheum arthritis Father   . Osteoarthritis Father   . Heart disease Brother   . Rheum arthritis Daughter   . Pulmonary embolism Brother     Allergies  Allergen Reactions  . Amlodipine Besylate     REACTION: edema  . Darvocet [Propoxyphene N-Acetaminophen]     insomnia  . Demerol [Meperidine]     sweating, very hot  . Hydrochlorothiazide W-Triamterene     REACTION: hives  . Lipitor [Atorvastatin] Other (See Comments)    Leg pain   . Lisinopril     REACTION: cough  . Meperidine Hcl     REACTION: flushed sensation---treated urgently with benadryl during medical procedure  . Penicillins Hives    As a child  . Simvastatin     REACTION: made legs hurt  . Sulfamethoxazole Diarrhea    With  Bloody stools  X 2    Current Outpatient Prescriptions on File Prior to Visit  Medication Sig Dispense Refill  .  calcium-vitamin D (OSCAL WITH D) 500-200 MG-UNIT per tablet Take 1 tablet by mouth 2 (two) times daily.      . diclofenac (VOLTAREN) 75 MG EC tablet Take 75 mg by mouth 2 (two) times daily.      . fexofenadine (ALLEGRA) 180 MG tablet Take 180 mg by mouth at bedtime.       . fish oil-omega-3 fatty acids 1000 MG capsule Take 2 g by mouth daily.        . hydrochlorothiazide (MICROZIDE) 12.5 MG capsule Take 12.5 mg by mouth every morning.      Marland Kitchen HYDROcodone-acetaminophen (NORCO) 5-325 MG per tablet Take 1-2 tablets by mouth every 6 (six) hours as needed for moderate pain.  40 tablet  0  . methylcellulose (CITRUCEL) oral powder Take 1 packet by mouth at bedtime.       . mometasone (NASONEX) 50 MCG/ACT nasal spray Place 2 sprays into the nose daily.      . nebivolol (BYSTOLIC) 10 MG tablet Take 10 mg by mouth every morning.      Marland Kitchen omeprazole (PRILOSEC) 20 MG capsule Take 20 mg by mouth every evening.      . pravastatin (PRAVACHOL)  40 MG tablet Take 40 mg by mouth at bedtime.      . tamoxifen (NOLVADEX) 20 MG tablet Take 1 tablet (20 mg total) by mouth daily.  90 tablet  12  . Vitamin D, Ergocalciferol, (DRISDOL) 50000 UNITS CAPS capsule Take 1 capsule (50,000 Units total) by mouth every 7 (seven) days.  8 capsule  0   No current facility-administered medications on file prior to visit.     patient denies chest pain, shortness of breath, orthopnea. Denies lower extremity edema, abdominal pain, change in appetite, change in bowel movements. Patient denies rashes, musculoskeletal complaints. No other specific complaints in a complete review of systems.   BP 160/90  Temp(Src) 98.4 F (36.9 C) (Oral)  Ht 5' 4.5" (1.638 m)  Wt 213 lb (96.616 kg)  BMI 36.01 kg/m2  Well-developed well-nourished female in no acute distress. HEENT exam atraumatic, normocephalic, extraocular muscles are intact. Neck is supple. No jugular venous distention no thyromegaly. Chest clear to auscultation without increased work of breathing. Cardiac exam S1 and S2 are regular. Abdominal exam active bowel sounds, soft, nontender. Extremities no edema. Neurologic exam she is alert without any motor sensory deficits. Gait is normal.  Well Visit- health maint utd Weight loss encouraged

## 2013-09-26 ENCOUNTER — Other Ambulatory Visit: Payer: Self-pay | Admitting: Internal Medicine

## 2013-11-30 ENCOUNTER — Other Ambulatory Visit: Payer: Self-pay | Admitting: *Deleted

## 2013-11-30 DIAGNOSIS — N6099 Unspecified benign mammary dysplasia of unspecified breast: Secondary | ICD-10-CM

## 2013-12-01 ENCOUNTER — Other Ambulatory Visit (HOSPITAL_BASED_OUTPATIENT_CLINIC_OR_DEPARTMENT_OTHER): Payer: BC Managed Care – PPO

## 2013-12-01 ENCOUNTER — Telehealth: Payer: Self-pay | Admitting: Hematology

## 2013-12-01 DIAGNOSIS — N6089 Other benign mammary dysplasias of unspecified breast: Secondary | ICD-10-CM

## 2013-12-01 DIAGNOSIS — N6099 Unspecified benign mammary dysplasia of unspecified breast: Secondary | ICD-10-CM

## 2013-12-01 LAB — COMPREHENSIVE METABOLIC PANEL (CC13)
ALT: 28 U/L (ref 0–55)
ANION GAP: 11 meq/L (ref 3–11)
AST: 18 U/L (ref 5–34)
Albumin: 3.6 g/dL (ref 3.5–5.0)
Alkaline Phosphatase: 45 U/L (ref 40–150)
BILIRUBIN TOTAL: 0.53 mg/dL (ref 0.20–1.20)
BUN: 12.9 mg/dL (ref 7.0–26.0)
CALCIUM: 9 mg/dL (ref 8.4–10.4)
CO2: 24 mEq/L (ref 22–29)
CREATININE: 0.7 mg/dL (ref 0.6–1.1)
Chloride: 108 mEq/L (ref 98–109)
GLUCOSE: 112 mg/dL (ref 70–140)
Potassium: 3.8 mEq/L (ref 3.5–5.1)
Sodium: 143 mEq/L (ref 136–145)
TOTAL PROTEIN: 6.1 g/dL — AB (ref 6.4–8.3)

## 2013-12-01 LAB — CBC WITH DIFFERENTIAL/PLATELET
BASO%: 0.9 % (ref 0.0–2.0)
BASOS ABS: 0.1 10*3/uL (ref 0.0–0.1)
EOS ABS: 0.2 10*3/uL (ref 0.0–0.5)
EOS%: 2.6 % (ref 0.0–7.0)
HEMATOCRIT: 41.4 % (ref 34.8–46.6)
HEMOGLOBIN: 13.7 g/dL (ref 11.6–15.9)
LYMPH#: 2 10*3/uL (ref 0.9–3.3)
LYMPH%: 25.7 % (ref 14.0–49.7)
MCH: 30.3 pg (ref 25.1–34.0)
MCHC: 33.1 g/dL (ref 31.5–36.0)
MCV: 91.5 fL (ref 79.5–101.0)
MONO#: 0.5 10*3/uL (ref 0.1–0.9)
MONO%: 6.9 % (ref 0.0–14.0)
NEUT%: 63.9 % (ref 38.4–76.8)
NEUTROS ABS: 4.9 10*3/uL (ref 1.5–6.5)
PLATELETS: 172 10*3/uL (ref 145–400)
RBC: 4.52 10*6/uL (ref 3.70–5.45)
RDW: 13.9 % (ref 11.2–14.5)
WBC: 7.7 10*3/uL (ref 3.9–10.3)

## 2013-12-01 NOTE — Telephone Encounter (Signed)
, °

## 2013-12-13 ENCOUNTER — Other Ambulatory Visit: Payer: Self-pay | Admitting: *Deleted

## 2013-12-13 DIAGNOSIS — N6099 Unspecified benign mammary dysplasia of unspecified breast: Secondary | ICD-10-CM

## 2013-12-15 ENCOUNTER — Ambulatory Visit (HOSPITAL_BASED_OUTPATIENT_CLINIC_OR_DEPARTMENT_OTHER): Payer: BC Managed Care – PPO | Admitting: Hematology

## 2013-12-15 ENCOUNTER — Telehealth: Payer: Self-pay | Admitting: Hematology

## 2013-12-15 ENCOUNTER — Ambulatory Visit: Payer: BC Managed Care – PPO | Admitting: Oncology

## 2013-12-15 ENCOUNTER — Other Ambulatory Visit (HOSPITAL_BASED_OUTPATIENT_CLINIC_OR_DEPARTMENT_OTHER): Payer: BC Managed Care – PPO

## 2013-12-15 ENCOUNTER — Encounter: Payer: Self-pay | Admitting: Hematology

## 2013-12-15 VITALS — BP 168/80 | HR 57 | Temp 98.2°F | Resp 18 | Ht 64.5 in | Wt 208.8 lb

## 2013-12-15 DIAGNOSIS — N6089 Other benign mammary dysplasias of unspecified breast: Secondary | ICD-10-CM

## 2013-12-15 DIAGNOSIS — N6099 Unspecified benign mammary dysplasia of unspecified breast: Secondary | ICD-10-CM

## 2013-12-15 DIAGNOSIS — N6092 Unspecified benign mammary dysplasia of left breast: Secondary | ICD-10-CM

## 2013-12-15 LAB — COMPREHENSIVE METABOLIC PANEL (CC13)
ALBUMIN: 3.7 g/dL (ref 3.5–5.0)
ALT: 32 U/L (ref 0–55)
ANION GAP: 7 meq/L (ref 3–11)
AST: 23 U/L (ref 5–34)
Alkaline Phosphatase: 48 U/L (ref 40–150)
BUN: 10.5 mg/dL (ref 7.0–26.0)
CALCIUM: 9.4 mg/dL (ref 8.4–10.4)
CHLORIDE: 106 meq/L (ref 98–109)
CO2: 29 meq/L (ref 22–29)
Creatinine: 0.7 mg/dL (ref 0.6–1.1)
Glucose: 82 mg/dl (ref 70–140)
Potassium: 4 mEq/L (ref 3.5–5.1)
Sodium: 143 mEq/L (ref 136–145)
Total Bilirubin: 0.74 mg/dL (ref 0.20–1.20)
Total Protein: 6.3 g/dL — ABNORMAL LOW (ref 6.4–8.3)

## 2013-12-15 LAB — CBC WITH DIFFERENTIAL/PLATELET
BASO%: 0.6 % (ref 0.0–2.0)
Basophils Absolute: 0 10*3/uL (ref 0.0–0.1)
EOS%: 3.2 % (ref 0.0–7.0)
Eosinophils Absolute: 0.2 10*3/uL (ref 0.0–0.5)
HEMATOCRIT: 41.2 % (ref 34.8–46.6)
HEMOGLOBIN: 13.9 g/dL (ref 11.6–15.9)
LYMPH%: 31.1 % (ref 14.0–49.7)
MCH: 30.5 pg (ref 25.1–34.0)
MCHC: 33.7 g/dL (ref 31.5–36.0)
MCV: 90.5 fL (ref 79.5–101.0)
MONO#: 0.4 10*3/uL (ref 0.1–0.9)
MONO%: 6.6 % (ref 0.0–14.0)
NEUT#: 3.1 10*3/uL (ref 1.5–6.5)
NEUT%: 58.5 % (ref 38.4–76.8)
Platelets: 156 10*3/uL (ref 145–400)
RBC: 4.55 10*6/uL (ref 3.70–5.45)
RDW: 13.6 % (ref 11.2–14.5)
WBC: 5.3 10*3/uL (ref 3.9–10.3)
lymph#: 1.6 10*3/uL (ref 0.9–3.3)

## 2013-12-15 NOTE — Telephone Encounter (Signed)
lvm for pt regarding to Jan 2016 appt....mailed pt appt sched/avs and letter °

## 2013-12-15 NOTE — Progress Notes (Signed)
[facility]  High Risk Clinic Follow up Visit  Name: Ann Fernandez            Date: 12/15/2013 MRN: [id number]                DOB: [birth year]  CC:  Dr. Darnell Level Swords Dr. Fanny Skates  PATIENT IDENTIFICATIONHISTORY OF PRESENT ILLNESS: Ann Fernandez is a 58 y.o. female without significant oncologic history. She was seen by her gynecologist for a routine examination she was noted to have lumpiness in the right breast. She had imaging studies performed including mammograms and ultrasounds. There were no imaging abnormalities on the right. However on the left she was noted to have a suspicious area of microcalcifications measuring 1 cm at the 1:00 position. 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 She was subsequently seen by Dr. Fanny Skates. She underwent a lumpectomy of the left breast on 06/03/2013. The final pathology revealed atypical ductal hyperplasia. One node was negative for metastatic disease. Patient was subsequently referred to the high-risk clinic for discussion of future breast cancer risk reduction. Patient was given a prescription for tamoxifen  INTERVAL HISTORY: Patient comes for followup. She has a number of symptoms which she attributes to the use of tamoxifen. She IS noticing some chest pain and pressure which happens every day and last for 10-15 minutes. She does have an element of anxiety. She also gets chills about 2 hours after she takes tamoxifen. She also complained of having more changes there is more anger and depression. She also complains of getting dizzy and of generalized weakness. She also states that her body feels more bloated and swollen. She did have labs today in office CBC/CMP which were normal.  PAST MEDICAL HISTORY:  has a past medical history of Allergy; Hyperlipidemia; Hypertension; Diverticulosis of colon; DDD (degenerative disc disease); IBS (irritable bowel  syndrome); PONV (postoperative nausea and vomiting); Anxiety; Wears glasses; and GERD (gastroesophageal reflux disease).  PAST SURGICAL HISTORY:  Past Surgical History  Procedure Laterality Date  . Cervical laminectomy    . Sinus irrigation    . Trigger finger release    . Knee arthroscopy    . Cyst removal trunk      LOWER BACK  . Abdominal hysterectomy  1994    TVH for DUB--ovaries retained    . Knee arthroscopy Right 05/25/2013    Procedure: RIGHT ARTHROSCOPY KNEE, PARTIAL MEDIAL MENISCECTOMY, CHONDROPLASTY, PARTIAL LATERAL MENISCECTOMY;  Surgeon: Gearlean Alf, MD;  Location: [facility];  Service: Orthopedics;  Laterality: Right;  . Back surgery  2011    lumb cyst  . Tonsillectomy    . Wisdom tooth extraction    . Dilation and curettage of uterus    . Partial mastectomy with needle localization Left 06/03/2013    Procedure: PARTIAL MASTECTOMY WITH NEEDLE LOCALIZATION;  Surgeon: [name], MD;  Location: [facility];  Service: General;  Laterality: Left;  . Breast surgery        CURRENT MEDICATIONS: Recorded in Epic but include:  oscal with D, tylenol prn, voltaren, allegra, antivert prn, laxative, nasonex, bystolic, fish oil, prilosec, pravachol, tamoxifen    ALLERGIES: Amlodipine besylate; Darvocet; Demerol; Hydrochlorothiazide w-triamterene; Lipitor; Lisinopril; Meperidine hcl; Penicillins; Simvastatin; and Sulfamethoxazole  SOCIAL HISTORY:  reports that she has never smoked. She has never used smokeless tobacco. She reports that she does not drink alcohol or use illicit drugs.  HEALTH HABITS: Vitamins: calcium and Vitamin  D Supplements: no Alternative Therapies: no Adverse environmental exposure: no Servings of fruit and vegetables/day: 4 Servings of meat/day: 1 Exercises regularly:        4 - 5 miles per day prior to knee surgery          Smoker/nonsmoker: no Alcohol: no Number of alcoholic beverages/week: none  REPRODUCTIVE HISTORY:  Menarche  age: 50 Gravida:    2   Para: 2 First Live Birth: 17 Number of live births: 2 Breast fed: no Took fertility meds:  no Menses: post meno Oral Contraceptives:   no      # of years Menopause: surgical  Age 53 HRT N Currently N TType:                                   # years Sexually transmitted disease:    FAMILY HISTORY:  family history includes COPD in her father; Cancer in her mother; Colon cancer in her mother; Deep vein thrombosis in her mother; Heart disease in her brother, father, and mother; Lung disease in her mother; Osteoarthritis in her father; Pulmonary embolism in her brother; Rheum arthritis in her daughter and father.  REVIEW OF SYSTEMS:  General: Negative for fever, ++chills, night sweats,  loss of appetite or weight loss. HEENT: Negative for headaches, sore  throat, difficulty swallowing, blurred vision or problem with hearing or  sinus congestion. Respiratory: Negative for shortness of breath, cough  or dyspnea on exertion. Cardiovascular: ++ for chest pain,  palpitations or pedal edema. GI: Negative for nausea, vomiting,  diarrhea, constipation, change in bowel habits or blood in the stool.  No jaundice. GU: Negative for painful or frequent urination, change in  color of urine, or decreased urinary stream. Integumentary: Negative  for skin rashes or other suspicious skin lesions. Hematologic: Negative  for easy bruisability or bleeding. Musculoskeletal: Negative for  complaints of pain, arthralgias, arthritis or myalgias.  Neurological/psychiatric: Negative for numbness, focal weakness,  balance problems or coordination difficulties. ++ depression or mood swings.  Breast: No self detected abnormalities in the breast. No nipple discharge, masses or redness of the skin.   PHYSICAL EXAM: BP 168/80  Pulse 57  Temp(Src) 98.2 F (36.8 C) (Oral)  Resp 18  Ht 5' 4.5" (1.638 m)  Wt 208 lb 12.8 oz (94.711 kg)  BMI 35.30 kg/m2 GENERAL: Well developed, well nourished, in  no acute distress.  EENT: No ocular or oral lesions. No stomatitis.  RESPIRATORY: Lungs are clear to auscultation bilaterally with normal respiratory movement and no accessory muscle use. CARDIAC: No murmur, rub or tachycardia. No upper or lower extremity edema.  GI: Abdomen is soft, no palpable hepatosplenomegaly. No fluid wave. No tenderness. Musculoskeletal: No kyphosis, no tenderness over the spine, ribs or hips. Lymph: No cervical, infraclavicular, axillary or inguinal adenopathy. Neuro: No focal neurological deficits. Psych: Alert and oriented X 3, appropriate mood and affect.  BREAST EXAM: In the supine position, with the right arm over the head, right nipple is everted. No periareolar edema or nipple discharge. No mass in any quadrant or subareolar region. No redness of the skin. No right axillary adenopathy. With the left arm over the head, left nipple is everted. No periareolar edema or nipple discharge. No mass in any quadrant or subareolar region. No redness of the skin. No left axillary adenopathy. Nurse present during exam.   ASSESSMENT/PLAN:58years-old female with ADH right breast and Lumpectomy Jan 2015.  1. I had a long discussion with the patient and she is worried about a lot of side effects happening from tamoxifen. Tamoxifen is currently used as chemoprevention and not as adjuvant therapy. The risk for a breast problem in the future based on her profile is 5% roughly. With tamoxifen, we can cut it down to 2-3%. On the flip side, tamoxifen increases the risk for thromboembolic events by 0-7%. She is a strong family history of cardiovascular disease and a brother died of blood clot at age 61. She has obesity, hypertension, hyperlipidemia and all these symptoms. She is going to see a cardiologist to make sure there is no underlying cardiac arrhythmia. Looking at the risk versus benefits, it is probably a good idea to stop tamoxifen and continue the breast surveillance by physical  examination and mammograms. Another option is to switch her to Arimidex which can still cause some hot flashes, vaginal dryness, arthralgias, bone loss but no risk for DVT. Patient will think about these options and will call us next week. I also gave her my e-mail address so she can inform us about the decision. If she decides to go on Arimidex, we will be doing a baseline bone density test. Her CBC today was normal. Chemistry results are normal as well. She'll be due for a mammogram in December 2015 and a followup with her gynecologist after that.  2. we will see her back in 6 months for a breast exam and followup.   3. patient was very appreciative of the time spent with her and I made written notes for her.   The length of time of the face-to-face encounter was 35    minutes. More than 50% of time was spent counseling and coordination of care.  Bernadene Bell, MD Medical Hematologist/Oncologist [facility] Pager: [phone] Office No: [phone]

## 2013-12-26 ENCOUNTER — Telehealth: Payer: Self-pay | Admitting: Hematology

## 2013-12-26 ENCOUNTER — Other Ambulatory Visit: Payer: Self-pay | Admitting: Hematology

## 2013-12-26 DIAGNOSIS — N959 Unspecified menopausal and perimenopausal disorder: Secondary | ICD-10-CM

## 2013-12-26 NOTE — Telephone Encounter (Signed)
, °

## 2014-01-05 ENCOUNTER — Ambulatory Visit
Admission: RE | Admit: 2014-01-05 | Discharge: 2014-01-05 | Disposition: A | Discharge status: Home / Self Care | Payer: BC Managed Care – PPO | Source: Ambulatory Visit | Attending: Hematology | Admitting: Hematology

## 2014-01-05 ENCOUNTER — Encounter: Payer: Self-pay | Admitting: Hematology

## 2014-01-05 DIAGNOSIS — N959 Unspecified menopausal and perimenopausal disorder: Secondary | ICD-10-CM

## 2014-01-09 ENCOUNTER — Encounter: Payer: Self-pay | Admitting: Hematology

## 2014-01-09 ENCOUNTER — Other Ambulatory Visit: Payer: Self-pay | Admitting: *Deleted

## 2014-01-09 ENCOUNTER — Ambulatory Visit (HOSPITAL_BASED_OUTPATIENT_CLINIC_OR_DEPARTMENT_OTHER): Payer: BC Managed Care – PPO | Admitting: Hematology

## 2014-01-09 VITALS — BP 165/67 | HR 57 | Temp 98.8°F | Resp 18 | Ht 64.5 in | Wt 209.3 lb

## 2014-01-09 DIAGNOSIS — I10 Essential (primary) hypertension: Secondary | ICD-10-CM

## 2014-01-09 DIAGNOSIS — E785 Hyperlipidemia, unspecified: Secondary | ICD-10-CM

## 2014-01-09 DIAGNOSIS — N6092 Unspecified benign mammary dysplasia of left breast: Secondary | ICD-10-CM

## 2014-01-09 DIAGNOSIS — N6099 Unspecified benign mammary dysplasia of unspecified breast: Secondary | ICD-10-CM

## 2014-01-09 DIAGNOSIS — Z8249 Family history of ischemic heart disease and other diseases of the circulatory system: Secondary | ICD-10-CM

## 2014-01-09 DIAGNOSIS — N6089 Other benign mammary dysplasias of unspecified breast: Secondary | ICD-10-CM

## 2014-01-09 MED ORDER — ANASTROZOLE 1 MG PO TABS: 1.0000 mg | ORAL_TABLET | Freq: Every day | ORAL | Status: DC

## 2014-01-09 NOTE — Progress Notes (Signed)
[facility]  High Risk Clinic Follow up Visit  Name: Ann Fernandez            Date: 01/09/2014 MRN: [id number]                DOB: [birth year]  CC:  Dr. Darnell Level Swords Dr. Fanny Skates  PATIENT IDENTIFICATIONHISTORY OF PRESENT ILLNESS: Ann Fernandez is a 58 y.o. female without significant oncologic history. She was seen by her gynecologist for a routine examination she was noted to have lumpiness in the right breast. She had imaging studies performed including mammograms and ultrasounds. There were no imaging abnormalities on the right. However on the left she was noted to have a suspicious area of microcalcifications measuring 1 cm at the 1:00 position. 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 She was subsequently seen by Dr. Fanny Skates. She underwent a lumpectomy of the left breast on 06/03/2013. The final pathology revealed atypical ductal hyperplasia. One node was negative for metastatic disease. Patient was subsequently referred to the high-risk clinic for discussion of future breast cancer risk reduction. Patient was given a prescription for tamoxifen. Patient was last seen here on 12/15/2013 by me. At that time, she has a number of symptoms which she attributes to the use of tamoxifen. She IS noticing some chest pain and pressure which happens every day and last for 10-15 minutes. She does have an element of anxiety. She also gets chills about 2 hours after she takes tamoxifen. She also complained of having more changes there is more anger and depression. She also complains of getting dizzy and of generalized weakness. She also states that her body feels more bloated and swollen. She did have labs today in office CBC/CMP which were normal.  INTERVAL HISTORY: Patient stopped Tamoxifen 2 weeks ago and states "I feel like a new person, I have more energy, I don't have chest pains, I had a rash and  itching on back which is gone".She is seeing a Film/video editor on Thursday. She has a strong FH of thromboembolic events and cardiovascular disease so stopping Tamoxifen is right thing. We had lot of discussion about Arimidex and role in prevention based on ASCO data 2015. She is willing to try it and we called a script today. Side effects were discussed in detail. Her DEXA scan was normal. EXAM: 12/25/2013 DUAL X-RAY ABSORPTIOMETRY (DXA) FOR BONE MINERAL DENSITY  FINDINGS:  AP LUMBAR SPINE L1-L4 Bone Mineral Density (BMD): 1.069 g/cm2 Young Adult T-Score: 0.2  Z-Score: 1.5 Left FEMUR neck Bone Mineral Density (BMD): 0.866 g/cm2 Young Adult T-Score: 0.2  Z-Score: 1.3 ASSESSMENT: Patient's diagnostic category is normal by WHO Criteria. FRACTURE RISK: Not increased  PAST MEDICAL HISTORY:  has a past medical history of Allergy; Hyperlipidemia; Hypertension; Diverticulosis of colon; DDD (degenerative disc disease); IBS (irritable bowel syndrome); PONV (postoperative nausea and vomiting); Anxiety; Wears glasses; and GERD (gastroesophageal reflux disease).  PAST SURGICAL HISTORY:  Past Surgical History  Procedure Laterality Date  . Cervical laminectomy    . Sinus irrigation    . Trigger finger release    . Knee arthroscopy    . Cyst removal trunk      LOWER BACK  . Abdominal hysterectomy  1994    TVH for DUB--ovaries retained    . Knee arthroscopy Right 05/25/2013    Procedure: RIGHT ARTHROSCOPY KNEE, PARTIAL MEDIAL MENISCECTOMY, CHONDROPLASTY, PARTIAL LATERAL MENISCECTOMY;  Surgeon: Gearlean Alf, MD;  Location: [facility];  Service: Orthopedics;  Laterality: Right;  . Back surgery  2011    lumb cyst  . Tonsillectomy    . Wisdom tooth extraction    . Dilation and curettage of uterus    . Partial mastectomy with needle localization Left 06/03/2013    Procedure: PARTIAL MASTECTOMY WITH NEEDLE LOCALIZATION;  Surgeon: [name], MD;  Location: [place];  Service: General;   Laterality: Left;  . Breast surgery        CURRENT MEDICATIONS: Recorded in Epic but include:  oscal with D, tylenol prn, voltaren, allegra, antivert prn, laxative, nasonex, bystolic, fish oil, prilosec, pravachol, tamoxifen    ALLERGIES: Amlodipine besylate; Darvocet; Demerol; Hydrochlorothiazide w-triamterene; Lipitor; Lisinopril; Meperidine hcl; Penicillins; Simvastatin; and Sulfamethoxazole  SOCIAL HISTORY:  reports that she has never smoked. She has never used smokeless tobacco. She reports that she does not drink alcohol or use illicit drugs.  HEALTH HABITS: Vitamins: calcium and Vitamin D Supplements: no Alternative Therapies: no Adverse environmental exposure: no Servings of fruit and vegetables/day: 4 Servings of meat/day: 1 Exercises regularly:        4 - 5 miles per day prior to knee surgery          Smoker/nonsmoker: no Alcohol: no Number of alcoholic beverages/week: none  REPRODUCTIVE HISTORY:  Menarche age: 59 Gravida:    2   Para: 2 First Live Birth: 17 Number of live births: 2 Breast fed: no Took fertility meds:  no Menses: post meno Oral Contraceptives:   no      # of years Menopause: surgical  Age 30 HRT N Currently N TType:                                   # years Sexually transmitted disease:    FAMILY HISTORY:  family history includes COPD in her father; Cancer in her mother; Colon cancer in her mother; Deep vein thrombosis in her mother; Heart disease in her brother, father, and mother; Lung disease in her mother; Osteoarthritis in her father; Pulmonary embolism in her brother; Rheum arthritis in her daughter and father.  REVIEW OF SYSTEMS:  General: Negative for fever, +chills, night sweats, more energy now less fatigue. loss of appetite or weight loss. HEENT: Negative for headaches, sore  throat, difficulty swallowing, blurred vision or problem with hearing or  sinus congestion. Respiratory: Negative for shortness of breath, cough  or dyspnea on  exertion. Cardiovascular: ++ for chest pain,  palpitations or pedal edema. GI: Negative for nausea, vomiting,  diarrhea, constipation, change in bowel habits or blood in the stool.  No jaundice. GU: Negative for painful or frequent urination, change in  color of urine, or decreased urinary stream. Integumentary: Negative  for skin rashes or other suspicious skin lesions. Hematologic: Negative  for easy bruisability or bleeding. Musculoskeletal: Negative for  complaints of pain, arthralgias, arthritis or myalgias.  Neurological/psychiatric: Negative for numbness, focal weakness,  balance problems or coordination difficulties. + depression or mood swings.  Breast: No self detected abnormalities in the breast. No nipple discharge, masses or redness of the skin.   PHYSICAL EXAM: BP 165/67  Pulse 57  Temp(Src) 98.8 F (37.1 C) (Oral)  Resp 18  Ht 5' 4.5" (1.638 m)  Wt 209 lb 4.8 oz (94.938 kg)  BMI 35.38 kg/m2 GENERAL: Well developed, well nourished, in no acute distress.  EENT: No ocular or oral lesions. No stomatitis.  RESPIRATORY:  Lungs are clear to auscultation bilaterally with normal respiratory movement and no accessory muscle use. CARDIAC: No murmur, rub or tachycardia. No upper or lower extremity edema.  GI: Abdomen is soft, no palpable hepatosplenomegaly. No fluid wave. No tenderness. Musculoskeletal: No kyphosis, no tenderness over the spine, ribs or hips. Lymph: No cervical, infraclavicular, axillary or inguinal adenopathy. Neuro: No focal neurological deficits. Psych: Alert and oriented X 3, appropriate mood and affect.  BREAST EXAM: deferred.   ASSESSMENT/PLAN:58years-old female with ADH right breast and Lumpectomy Jan 2015.  1. I had a long discussion with the patient and she is worried about a lot of side effects happening from tamoxifen. Tamoxifen is currently used as chemoprevention and not as adjuvant therapy. The risk for a breast problem in the future based on her  profile is 5% roughly. With tamoxifen, we can cut it down to 2-3%. On the flip side, tamoxifen increases the risk for thromboembolic events by 7-5%. She is a strong family history of cardiovascular disease and a brother died of blood clot at age 45. She has obesity, hypertension, hyperlipidemia and all these symptoms. She is going to see a cardiologist to make sure there is no underlying cardiac arrhythmia. Looking at the risk versus benefits, it is probably a good idea to stop tamoxifen and continue the breast surveillance by physical examination and mammograms. Another option is to switch her to Arimidex which can still cause some hot flashes, vaginal dryness, arthralgias, bone loss but no risk for DVT. We are starting Arimidex now. Her baseline DEXA is fine done on 12/26/2013. She'll be due for a mammogram in December 2015 and a followup with her gynecologist after that.  2. we will see her back in 1 months for a breast exam and follow up to see how she is tolerating Arimidex.  3. patient was very appreciative of the time spent with her and I made written notes for her.   The length of time of the face-to-face encounter was 25    minutes. More than 50% of time was spent counseling and coordination of care.  Bernadene Bell, MD Medical Hematologist/Oncologist [place] Pager: [phone] Office No: [phone]

## 2014-01-10 ENCOUNTER — Telehealth: Payer: Self-pay | Admitting: Hematology

## 2014-01-10 NOTE — Telephone Encounter (Signed)
s.w. pt and advised on Sept appt pt ok and aware °

## 2014-01-16 ENCOUNTER — Other Ambulatory Visit: Payer: Self-pay | Admitting: *Deleted

## 2014-01-16 ENCOUNTER — Encounter: Payer: Self-pay | Admitting: *Deleted

## 2014-01-19 ENCOUNTER — Encounter: Payer: Self-pay | Admitting: Cardiology

## 2014-01-19 ENCOUNTER — Ambulatory Visit (INDEPENDENT_AMBULATORY_CARE_PROVIDER_SITE_OTHER): Payer: BC Managed Care – PPO | Admitting: Cardiology

## 2014-01-19 VITALS — BP 126/80 | HR 64 | Ht 64.5 in | Wt 208.0 lb

## 2014-01-19 DIAGNOSIS — R0609 Other forms of dyspnea: Secondary | ICD-10-CM

## 2014-01-19 DIAGNOSIS — R0600 Dyspnea, unspecified: Secondary | ICD-10-CM

## 2014-01-19 DIAGNOSIS — R0989 Other specified symptoms and signs involving the circulatory and respiratory systems: Secondary | ICD-10-CM

## 2014-01-19 DIAGNOSIS — R079 Chest pain, unspecified: Secondary | ICD-10-CM

## 2014-01-19 DIAGNOSIS — E785 Hyperlipidemia, unspecified: Secondary | ICD-10-CM

## 2014-01-19 NOTE — Progress Notes (Signed)
Patient ID: DELICIA BERENS, female   DOB: Sep 15, 1955, 58 y.o.   MRN: [id number]    Patient Name: Ann Fernandez Date of Encounter: 01/19/2014  Primary Care Provider:  Chancy Hurter, MD Primary Cardiologist:  Dorothy Spark   Problem List   Past Medical History  Diagnosis Date  . Allergy   . Hyperlipidemia   . Hypertension   . Diverticulosis of colon   . DDD (degenerative disc disease)   . IBS (irritable bowel syndrome)   . PONV (postoperative nausea and vomiting)   . Anxiety     regarding  upcoming breast surgery  . Wears glasses   . GERD (gastroesophageal reflux disease)    Past Surgical History  Procedure Laterality Date  . Cervical laminectomy    . Sinus irrigation    . Trigger finger release    . Knee arthroscopy    . Cyst removal trunk      LOWER BACK  . Abdominal hysterectomy  1994    TVH for DUB--ovaries retained    . Knee arthroscopy Right 05/25/2013    Procedure: RIGHT ARTHROSCOPY KNEE, PARTIAL MEDIAL MENISCECTOMY, CHONDROPLASTY, PARTIAL LATERAL MENISCECTOMY;  Surgeon: Gearlean Alf, MD;  Location: [facility];  Service: Orthopedics;  Laterality: Right;  . Back surgery  2011    lumb cyst  . Tonsillectomy    . Wisdom tooth extraction    . Dilation and curettage of uterus    . Partial mastectomy with needle localization Left 06/03/2013    Procedure: PARTIAL MASTECTOMY WITH NEEDLE LOCALIZATION;  Surgeon: [name], MD;  Location: [place];  Service: General;  Laterality: Left;  . Breast surgery      Allergies  Allergies  Allergen Reactions  . Amlodipine Besylate     REACTION: edema  . Darvocet [Propoxyphene N-Acetaminophen]     insomnia  . Demerol [Meperidine]     sweating, very hot  . Hydrochlorothiazide W-Triamterene     REACTION: hives  . Lipitor [Atorvastatin] Other (See Comments)    Leg pain   . Lisinopril     REACTION: cough  . Meperidine Hcl     REACTION: flushed sensation---treated urgently with benadryl during  medical procedure  . Penicillins Hives    As a child  . Simvastatin     REACTION: made legs hurt  . Sulfamethoxazole Diarrhea    With  Bloody stools  X 2    HPI  A very pleasant 58 year old female with h/o benign breast mass, s/p resection who was placed on Tamoxifen and started to experience exertional SOB, chest pain that resolved at rest, she ended up in the ER where ACS was ruled out. Tamoxifen was discontinued with relief of symptoms. She is currently taking anastrozole.  She has very significant family h/o of premature CAD< her brother had MI at age 52 --> 4V CABG, died of pulmonary embolism at age 58. Mother had CABG at age 72, father MI at age 2, died at 36. She has never smoked. She is obese but fairly active, taking care of her 3 grandchildren, she is retired. While taking tamoxifen she also had episode of PND, no palpitations or syncope.   Home Medications  Prior to Admission medications   Medication Sig Start Date End Date Taking? Authorizing Provider  acetaminophen (PAIN RELIEVER EXTRA STRENGTH) 500 MG tablet Take by mouth as needed.  05/30/10  Yes Historical Provider, MD  anastrozole (ARIMIDEX) 1 MG tablet Take 1 tablet (1 mg total) by  mouth daily. 01/09/14  Yes Aasim Marla Roe, MD  calcium-vitamin D (OSCAL WITH D) 500-200 MG-UNIT per tablet Take 1 tablet by mouth 2 (two) times daily.   Yes Historical Provider, MD  diclofenac (VOLTAREN) 75 MG EC tablet Take 75 mg by mouth daily.    Yes Historical Provider, MD  fexofenadine (ALLEGRA) 180 MG tablet Take 180 mg by mouth at bedtime.    Yes Historical Provider, MD  fish oil-omega-3 fatty acids 1000 MG capsule Take 2 g by mouth daily.     Yes Historical Provider, MD  hydrochlorothiazide (HYDRODIURIL) 25 MG tablet TAKE ONE HALF TABLET DAILY. 09/26/13  Yes Lisabeth Pick, MD  meclizine (ANTIVERT) 25 MG tablet Take 25 mg by mouth as needed.  08/17/12  Yes Historical Provider, MD  methylcellulose (CITRUCEL) oral powder Take 1 packet  by mouth at bedtime.    Yes Historical Provider, MD  mometasone (NASONEX) 50 MCG/ACT nasal spray Place 2 sprays into the nose daily.   Yes Historical Provider, MD  nebivolol (BYSTOLIC) 10 MG tablet Take 10 mg by mouth every morning.   Yes Historical Provider, MD  omeprazole (PRILOSEC) 20 MG capsule Take 20 mg by mouth as needed.    Yes Historical Provider, MD  pravastatin (PRAVACHOL) 40 MG tablet Take 40 mg by mouth at bedtime.   Yes Historical Provider, MD    Family History  Family History  Problem Relation Age of Onset  . Heart disease Mother   . Deep vein thrombosis Mother   . Colon cancer Mother   . Rectal cancer Mother   . Lung disease Mother   . Heart disease Father   . COPD Father   . Rheum arthritis Father   . Osteoarthritis Father   . Heart disease Brother   . Rheum arthritis Daughter   . Pulmonary embolism Brother     Social History  History   Social History  . Marital Status: Married    Spouse Name: N/A    Number of Children: N/A  . Years of Education: N/A   Occupational History  . Not on file.   Social History Main Topics  . Smoking status: Never Smoker   . Smokeless tobacco: Never Used  . Alcohol Use: No  . Drug Use: No  . Sexual Activity: Yes    Partners: Male     Comment: TVH   Other Topics Concern  . Not on file   Social History Narrative  . No narrative on file     Review of Systems, as per HPI, otherwise negative General:  No chills, fever, night sweats or weight changes.  Cardiovascular:  No chest pain, dyspnea on exertion, edema, orthopnea, palpitations, paroxysmal nocturnal dyspnea. Dermatological: No rash, lesions/masses Respiratory: No cough, dyspnea Urologic: No hematuria, dysuria Abdominal:   No nausea, vomiting, diarrhea, bright red blood per rectum, melena, or hematemesis Neurologic:  No visual changes, wkns, changes in mental status. All other systems reviewed and are otherwise negative except as noted above.  Physical  Exam  Blood pressure 126/80, pulse 64, height 5' 4.5" (1.638 m), weight 208 lb (94.348 kg).  General: Pleasant, NAD Psych: Normal affect. Neuro: Alert and oriented X 3. Moves all extremities spontaneously. HEENT: Normal  Neck: Supple without bruits or JVD. Lungs:  Resp regular and unlabored, CTA. Heart: RRR no s3, s4, or murmurs. Abdomen: Soft, non-tender, non-distended, BS + x 4.  Extremities: No clubbing, cyanosis or edema. DP/PT/Radials 2+ and equal bilaterally.  Labs:  No results found for  this basename: CKTOTAL, CKMB, TROPONINI,  in the last 72 hours Lab Results  Component Value Date   WBC 5.3 12/15/2013   HGB 13.9 12/15/2013   HCT 41.2 12/15/2013   MCV 90.5 12/15/2013   PLT 156 12/15/2013    No results found for this basename: DDIMER   No components found with this basename: POCBNP,     Component Value Date/Time   NA 143 12/15/2013 0952   NA 140 08/15/2013 1053   K 4.0 12/15/2013 0952   K 4.0 08/15/2013 1053   CL 104 08/15/2013 1053   CO2 29 12/15/2013 0952   CO2 28 08/15/2013 1053   GLUCOSE 82 12/15/2013 0952   GLUCOSE 90 08/15/2013 1053   BUN 10.5 12/15/2013 0952   BUN 12 08/15/2013 1053   CREATININE 0.7 12/15/2013 0952   CREATININE 0.6 08/15/2013 1053   CALCIUM 9.4 12/15/2013 0952   CALCIUM 9.5 08/15/2013 1053   PROT 6.3* 12/15/2013 0952   PROT 6.7 08/15/2013 1053   ALBUMIN 3.7 12/15/2013 0952   ALBUMIN 4.2 08/15/2013 1053   AST 23 12/15/2013 0952   AST 28 08/15/2013 1053   ALT 32 12/15/2013 0952   ALT 45* 08/15/2013 1053   ALKPHOS 48 12/15/2013 0952   ALKPHOS 64 08/15/2013 1053   BILITOT 0.74 12/15/2013 0952   BILITOT 1.2 08/15/2013 1053   GFRNONAA >90 05/16/2013 1425   GFRAA >90 05/16/2013 1425   Lab Results  Component Value Date   CHOL 227* 08/15/2013   HDL 40.70 08/15/2013   LDLCALC 146* 08/15/2013   TRIG 203.0* 08/15/2013   TSH 1.78 HbA1c 5.7%  Accessory Clinical Findings  Echocardiogram - none  ECG - SR, possible septal infarct (age undetermined)    Assessment  & Plan  A very pleasant 58 year old female  1. Typical exertional CP and DOE - abnormal baseline ECG, we will order an exercise nuclear stress test to evaluate for possible scar or ischemia  2. Symptoms of CHF - order echo  3. HTN - well controlled   4. Hyperlipidemia - intolerant to multiple statins, currently on Pravastatin  Follow up in 4 weeks    Dorothy Spark, MD, [facility] 01/19/2014, 4:01 PM

## 2014-01-19 NOTE — Patient Instructions (Addendum)
Your physician recommends that you continue on your current medications as directed. Please refer to the Current Medication list given to you today.  Your physician has requested that you have an echocardiogram. Echocardiography is a painless test that uses sound waves to create images of your heart. It provides your doctor with information about the size and shape of your heart and how well your heart's chambers and valves are working. This procedure takes approximately one hour. There are no restrictions for this procedure.   Your physician has requested that you have en exercise stress myoview. For further information please visit [email or web address]. Please follow instruction sheet, as given.  Dr Meda Coffee has referred for you to have a sleep study done by Dr Radford Pax  Your physician recommends that you schedule a follow-up appointment in: with Dr Meda Coffee after all your test are performed and resulted

## 2014-01-20 DIAGNOSIS — R0600 Dyspnea, unspecified: Secondary | ICD-10-CM

## 2014-01-20 DIAGNOSIS — E785 Hyperlipidemia, unspecified: Secondary | ICD-10-CM | POA: Insufficient documentation

## 2014-01-20 DIAGNOSIS — R0609 Other forms of dyspnea: Secondary | ICD-10-CM

## 2014-01-20 DIAGNOSIS — R079 Chest pain, unspecified: Secondary | ICD-10-CM | POA: Insufficient documentation

## 2014-01-20 HISTORY — DX: Chest pain, unspecified: R07.9

## 2014-01-20 HISTORY — DX: Other forms of dyspnea: R06.09

## 2014-01-20 HISTORY — DX: Dyspnea, unspecified: R06.00

## 2014-01-27 ENCOUNTER — Ambulatory Visit: Payer: BC Managed Care – PPO | Admitting: Cardiology

## 2014-01-27 ENCOUNTER — Ambulatory Visit (HOSPITAL_COMMUNITY): Payer: BC Managed Care – PPO | Attending: Cardiology | Admitting: Cardiology

## 2014-01-27 ENCOUNTER — Ambulatory Visit (HOSPITAL_BASED_OUTPATIENT_CLINIC_OR_DEPARTMENT_OTHER): Payer: BC Managed Care – PPO | Admitting: Radiology

## 2014-01-27 VITALS — BP 144/88 | Ht 64.5 in | Wt 205.0 lb

## 2014-01-27 DIAGNOSIS — R079 Chest pain, unspecified: Secondary | ICD-10-CM

## 2014-01-27 DIAGNOSIS — I519 Heart disease, unspecified: Secondary | ICD-10-CM | POA: Diagnosis not present

## 2014-01-27 DIAGNOSIS — R0609 Other forms of dyspnea: Secondary | ICD-10-CM

## 2014-01-27 DIAGNOSIS — R0602 Shortness of breath: Secondary | ICD-10-CM

## 2014-01-27 DIAGNOSIS — I517 Cardiomegaly: Secondary | ICD-10-CM | POA: Insufficient documentation

## 2014-01-27 DIAGNOSIS — R0600 Dyspnea, unspecified: Secondary | ICD-10-CM

## 2014-01-27 MED ORDER — REGADENOSON 0.4 MG/5ML IV SOLN
0.4000 mg | Freq: Once | INTRAVENOUS | Status: AC
  Administered 2014-01-27: 0.4 mg via INTRAVENOUS

## 2014-01-27 MED ORDER — TECHNETIUM TC 99M SESTAMIBI GENERIC - CARDIOLITE
33.0000 | Freq: Once | INTRAVENOUS | Status: AC | PRN
  Administered 2014-01-27: 33 via INTRAVENOUS

## 2014-01-27 MED ORDER — TECHNETIUM TC 99M SESTAMIBI GENERIC - CARDIOLITE
11.0000 | Freq: Once | INTRAVENOUS | Status: AC | PRN
  Administered 2014-01-27: 11 via INTRAVENOUS

## 2014-01-27 NOTE — Progress Notes (Signed)
Echo performed. 

## 2014-01-27 NOTE — Progress Notes (Signed)
[facility] [address], [place] [address] [phone]    Cardiology Nuclear Med Study  Ann Fernandez is a 58 y.o. female     MRN : [id number]     DOB: [birth year]  Procedure Date: 01/27/2014  Nuclear Med Background Indication for Stress Test:  Evaluation for Ischemia and Abnormal EKG History:  No Cardiac History Cardiac Risk Factors: Hypertension  Symptoms:  Chest Pain and DOE   Nuclear Pre-Procedure Caffeine/Decaff Intake:  None NPO After: 6 pm   Lungs:  clear O2 Sat: 97% on room air. IV 0.9% NS with Angio Cath:  22g  IV Site: R Hand  IV Started by:  Crissie Figures, RN  Chest Size (in):  38 Cup Size: D  Height: 5' 4.5" (1.638 m)  Weight:  205 lb (92.987 kg)  BMI:  Body mass index is 34.66 kg/(m^2). Tech Comments:  N/A    Nuclear Med Study 1 or 2 day study: 1 day  Stress Test Type:  Stress  Reading MD: N/A  Order Authorizing Provider:  Ena Dawley, MD  Resting Radionuclide: Technetium 49m Sestamibi  Resting Radionuclide Dose: 11.0 mCi   Stress Radionuclide:  Technetium 72m Sestamibi  Stress Radionuclide Dose: 33.0 mCi           Stress Protocol Rest HR: 62 Stress HR: 96  Rest BP: 144/88 Stress BP: 167/88  Exercise Time (min): 2:00 METS: 1.6   Predicted Max HR: 162 bpm % Max HR: 35.19 bpm Rate Pressure Product: 8208   Dose of Adenosine (mg):  n/a Dose of Lexiscan: 0.4 mg  Dose of Atropine (mg): n/a Dose of Dobutamine: n/a mcg/kg/min (at max HR)  Stress Test Technologist: Crissie Figures, RN  Nuclear Technologist:  Annye Rusk, CNMT     Rest Procedure:  Myocardial perfusion imaging was performed at rest 45 minutes following the intravenous administration of Technetium 29m Sestamibi. Rest ECG: NSR - Normal EKG  Stress Procedure:  The patient received IV Lexiscan 0.4 mg over 15-seconds with concurrent low level exercise and then Technetium 26m Sestamibi was injected at 30-seconds while the patient continued walking one more  minute.  Quantitative spect images were obtained after a 45-minute delay. Stress ECG: No significant change from baseline ECG  QPS Raw Data Images:  Normal; no motion artifact; normal heart/lung ratio. Stress Images:  Normal homogeneous uptake in all areas of the myocardium. Rest Images:  Normal homogeneous uptake in all areas of the myocardium. Subtraction (SDS):  Normal Transient Ischemic Dilatation (Normal <1.22):  1.19 Lung/Heart Ratio (Normal <0.45):  0.33  Quantitative Gated Spect Images QGS EDV:  82 ml QGS ESV:  29 ml  Impression Exercise Capacity:  Lexiscan with low level exercise. BP Response:  Normal blood pressure response. Clinical Symptoms:  No significant symptoms noted. ECG Impression:  No significant ST segment change suggestive of ischemia. Comparison with Prior Nuclear Study: No images to compare  Overall Impression:  Normal stress nuclear study.  LV Ejection Fraction: 64%.  LV Wall Motion:  NL LV Function; NL Wall Motion   Jenkins Rouge

## 2014-01-31 ENCOUNTER — Telehealth: Payer: Self-pay | Admitting: Cardiology

## 2014-01-31 NOTE — Telephone Encounter (Signed)
Notified pt of normal stress test, no ischemia, no infarct per Dr Meda Coffee.  Pt verbalized understanding and pleased with this news.

## 2014-01-31 NOTE — Telephone Encounter (Signed)
New message    Calling for test results  

## 2014-02-01 ENCOUNTER — Encounter: Payer: Self-pay | Admitting: Obstetrics and Gynecology

## 2014-02-13 ENCOUNTER — Other Ambulatory Visit (HOSPITAL_BASED_OUTPATIENT_CLINIC_OR_DEPARTMENT_OTHER): Payer: BC Managed Care – PPO

## 2014-02-13 ENCOUNTER — Encounter: Payer: Self-pay | Admitting: Hematology and Oncology

## 2014-02-13 ENCOUNTER — Ambulatory Visit (HOSPITAL_BASED_OUTPATIENT_CLINIC_OR_DEPARTMENT_OTHER): Payer: BC Managed Care – PPO | Admitting: Hematology and Oncology

## 2014-02-13 VITALS — BP 177/86 | HR 67 | Temp 98.4°F | Resp 18 | Ht 64.0 in | Wt 210.0 lb

## 2014-02-13 DIAGNOSIS — N6089 Other benign mammary dysplasias of unspecified breast: Secondary | ICD-10-CM

## 2014-02-13 DIAGNOSIS — N6099 Unspecified benign mammary dysplasia of unspecified breast: Secondary | ICD-10-CM

## 2014-02-13 DIAGNOSIS — N6092 Unspecified benign mammary dysplasia of left breast: Secondary | ICD-10-CM

## 2014-02-13 LAB — CBC WITH DIFFERENTIAL/PLATELET
BASO%: 0.7 % (ref 0.0–2.0)
Basophils Absolute: 0 10*3/uL (ref 0.0–0.1)
EOS%: 3.5 % (ref 0.0–7.0)
Eosinophils Absolute: 0.2 10*3/uL (ref 0.0–0.5)
HCT: 42.6 % (ref 34.8–46.6)
HGB: 14.2 g/dL (ref 11.6–15.9)
LYMPH%: 28.5 % (ref 14.0–49.7)
MCH: 30.6 pg (ref 25.1–34.0)
MCHC: 33.3 g/dL (ref 31.5–36.0)
MCV: 91.9 fL (ref 79.5–101.0)
MONO#: 0.5 10*3/uL (ref 0.1–0.9)
MONO%: 7.6 % (ref 0.0–14.0)
NEUT#: 3.9 10*3/uL (ref 1.5–6.5)
NEUT%: 59.7 % (ref 38.4–76.8)
Platelets: 179 10*3/uL (ref 145–400)
RBC: 4.64 10*6/uL (ref 3.70–5.45)
RDW: 13.5 % (ref 11.2–14.5)
WBC: 6.5 10*3/uL (ref 3.9–10.3)
lymph#: 1.9 10*3/uL (ref 0.9–3.3)

## 2014-02-13 LAB — BASIC METABOLIC PANEL (CC13)
Anion Gap: 11 mEq/L (ref 3–11)
BUN: 11.5 mg/dL (ref 7.0–26.0)
CO2: 25 mEq/L (ref 22–29)
Calcium: 9.4 mg/dL (ref 8.4–10.4)
Chloride: 108 mEq/L (ref 98–109)
Creatinine: 0.7 mg/dL (ref 0.6–1.1)
Glucose: 105 mg/dl (ref 70–140)
POTASSIUM: 3.9 meq/L (ref 3.5–5.1)
SODIUM: 143 meq/L (ref 136–145)

## 2014-02-13 NOTE — Progress Notes (Signed)
Patient Care Team: Lisabeth Pick, MD as PCP - General Neale Burly, MD as Consulting Physician (Internal Medicine)  DIAGNOSIS: Atypical ductal hyperplasia  CHIEF COMPLIANT: Inability to tolerate antiestrogen therapy  INTERVAL HISTORY: Ann Fernandez is a 58 year old Caucasian lady with above-mentioned history of atypical ductal hyperplasia that was diagnosed when she underwent a routine mammogram. 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 She underwent lumpectomy of the left breast on 06/03/2013. The final pathology revealed atypical ductal hyperplasia. One node was negative for metastatic disease. Patient was started on tamoxifen 12/15/2013 but she had to stop it because of side effects including chest pain. She was then switched over to aromatase inhibitor therapy and although initially she tolerated it better, she started to notice increased hot flashes and tingling and numbness of the legs. She attributes her symptoms to being on aromatase inhibitor therapy. She is worried about her quality of life on this treatment. She does not feel that the risks of antiestrogen therapy are worth the benefits.  REVIEW OF SYSTEMS:   Constitutional: Denies fevers, chills or abnormal weight loss Eyes: Denies blurriness of vision Ears, nose, mouth, throat, and face: Denies mucositis or sore throat Respiratory: Denies cough, dyspnea or wheezes Cardiovascular: Denies palpitation, chest discomfort or lower extremity swelling Gastrointestinal:  Denies nausea, heartburn or change in bowel habits Skin: Denies abnormal skin rashes Lymphatics: Denies new lymphadenopathy or easy bruising Neurological:Denies numbness, tingling or new weaknesses Behavioral/Psych: Mood is stable, no new changes  Breast:  denies any pain or lumps or nodules in either breasts All other systems were reviewed with the patient and are negative.  I have reviewed the past medical history, past  surgical history, social history and family history with the patient and they are unchanged from previous note.  ALLERGIES:  is allergic to amlodipine besylate; darvocet; demerol; hydrochlorothiazide w-triamterene; lipitor; lisinopril; meperidine hcl; penicillins; simvastatin; and sulfamethoxazole.  MEDICATIONS:  Current Outpatient Prescriptions  Medication Sig Dispense Refill  . acetaminophen (PAIN RELIEVER EXTRA STRENGTH) 500 MG tablet Take by mouth as needed.       Marland Kitchen anastrozole (ARIMIDEX) 1 MG tablet Take 1 tablet (1 mg total) by mouth daily.  30 tablet  1  . calcium-vitamin D (OSCAL WITH D) 500-200 MG-UNIT per tablet Take 1 tablet by mouth 2 (two) times daily.      . diclofenac (VOLTAREN) 75 MG EC tablet Take 75 mg by mouth daily.       . fexofenadine (ALLEGRA) 180 MG tablet Take 180 mg by mouth at bedtime.       . fish oil-omega-3 fatty acids 1000 MG capsule Take 2 g by mouth daily.        . hydrochlorothiazide (HYDRODIURIL) 25 MG tablet TAKE ONE HALF TABLET DAILY.      . meclizine (ANTIVERT) 25 MG tablet Take 25 mg by mouth as needed.       . methylcellulose (CITRUCEL) oral powder Take 1 packet by mouth at bedtime.       . mometasone (NASONEX) 50 MCG/ACT nasal spray Place 2 sprays into the nose daily.      . nebivolol (BYSTOLIC) 10 MG tablet Take 10 mg by mouth every morning.      Marland Kitchen omeprazole (PRILOSEC) 20 MG capsule Take 20 mg by mouth as needed.       . pravastatin (PRAVACHOL) 40 MG tablet Take 40 mg by mouth at bedtime.       No current facility-administered medications for this visit.  PHYSICAL EXAMINATION: ECOG PERFORMANCE STATUS: 1 - Symptomatic but completely ambulatory  Filed Vitals:   02/13/14 0939  BP: 177/86  Pulse: 67  Temp: 98.4 F (36.9 C)  Resp: 18   Filed Weights   02/13/14 0939  Weight: 210 lb (95.255 kg)    GENERAL:alert, no distress and comfortable SKIN: skin color, texture, turgor are normal, no rashes or significant lesions EYES: normal,  Conjunctiva are pink and non-injected, sclera clear OROPHARYNX:no exudate, no erythema and lips, buccal mucosa, and tongue normal  NECK: supple, thyroid normal size, non-tender, without nodularity LYMPH:  no palpable lymphadenopathy in the cervical, axillary or inguinal LUNGS: clear to auscultation and percussion with normal breathing effort HEART: regular rate & rhythm and no murmurs and no lower extremity edema ABDOMEN:abdomen soft, non-tender and normal bowel sounds Musculoskeletal:no cyanosis of digits and no clubbing  NEURO: alert & oriented x 3 with fluent speech, no focal motor/sensory deficits  LABORATORY DATA:  I have reviewed the data as listed   Chemistry      Component Value Date/Time   NA 143 02/13/2014 0928   NA 140 08/15/2013 1053   K 3.9 02/13/2014 0928   K 4.0 08/15/2013 1053   CL 104 08/15/2013 1053   CO2 25 02/13/2014 0928   CO2 28 08/15/2013 1053   BUN 11.5 02/13/2014 0928   BUN 12 08/15/2013 1053   CREATININE 0.7 02/13/2014 0928   CREATININE 0.6 08/15/2013 1053      Component Value Date/Time   CALCIUM 9.4 02/13/2014 0928   CALCIUM 9.5 08/15/2013 1053   ALKPHOS 48 12/15/2013 0952   ALKPHOS 64 08/15/2013 1053   AST 23 12/15/2013 0952   AST 28 08/15/2013 1053   ALT 32 12/15/2013 0952   ALT 45* 08/15/2013 1053   BILITOT 0.74 12/15/2013 0952   BILITOT 1.2 08/15/2013 1053       Lab Results  Component Value Date   WBC 6.5 02/13/2014   HGB 14.2 02/13/2014   HCT 42.6 02/13/2014   MCV 91.9 02/13/2014   PLT 179 02/13/2014   NEUTROABS 3.9 02/13/2014     RADIOGRAPHIC STUDIES: I have personally reviewed the radiology reports and agreed with their findings. No results found.   ASSESSMENT & PLAN:  Atypical ductal hyperplasia, breast Left breast atypical ductal hyperplasia treated with lumpectomy: Patient could not tolerate tamoxifen so she was started on Arimidex. She is having difficulty with Arimidex as well. Experiencing severe hot flashes, tingling numbness of the right leg,  weight gain. She reports that these symptoms are slowly increasing in intensity. Previously on tamoxifen she had such severe symptoms that it affected her quality of life. She is very concerned about the same with Arimidex.  I recommended that she can take Arimidex 3-4 days a week. She will try to do so and when she comes back, we will assess how she is doing to determine if she can continue this further. She is unable to tolerate it, based on risk-benefit analysis, we might discontinue therapy. I discussed indication for Neurontin to help with the hot flashes but we will see if reducing the number of days of treatment may help her symptoms enough to not require additional medication therapy since she is already on multiple other medications.  Return to clinic in January after the mammograms as was previously scheduled.   No orders of the defined types were placed in this encounter.   The patient has a good understanding of the overall plan. she agrees with it. She will  call with any problems that may develop before her next visit here.  I spent 20 minutes counseling the patient face to face. The total time spent in the appointment was 25 minutes and more than 50% was on counseling and review of test results    Rulon Eisenmenger, MD 02/13/2014 2:42 PM

## 2014-02-13 NOTE — Assessment & Plan Note (Signed)
Left breast atypical ductal hyperplasia treated with lumpectomy: Patient could not tolerate tamoxifen so she was started on Arimidex. She is having difficulty with Arimidex as well. Experiencing severe hot flashes, tingling numbness of the right leg, weight gain. She reports that these symptoms are slowly increasing in intensity. Previously on tamoxifen she had such severe symptoms that it affected her quality of life. She is very concerned about the same with Arimidex.  I recommended that she can take Arimidex 3-4 days a week. She will try to do so and when she comes back, we will assess how she is doing to determine if she can continue this further. She is unable to tolerate it, based on risk-benefit analysis, we might discontinue therapy. I discussed indication for Neurontin to help with the hot flashes but we will see if reducing the number of days of treatment may help her symptoms enough to not require additional medication therapy since she is already on multiple other medications.  Return to clinic in January after the mammograms as was previously scheduled.

## 2014-02-21 ENCOUNTER — Other Ambulatory Visit: Payer: Self-pay

## 2014-02-21 DIAGNOSIS — N6099 Unspecified benign mammary dysplasia of unspecified breast: Secondary | ICD-10-CM

## 2014-02-27 ENCOUNTER — Telehealth: Payer: Self-pay

## 2014-02-27 NOTE — Telephone Encounter (Signed)
Order sent for mammo to [facility].  Sent to scan

## 2014-03-03 ENCOUNTER — Encounter: Payer: Self-pay | Admitting: Family Medicine

## 2014-03-16 ENCOUNTER — Ambulatory Visit (HOSPITAL_BASED_OUTPATIENT_CLINIC_OR_DEPARTMENT_OTHER): Payer: BC Managed Care – PPO | Attending: Cardiology

## 2014-03-16 DIAGNOSIS — R400 Somnolence: Secondary | ICD-10-CM | POA: Diagnosis present

## 2014-03-16 DIAGNOSIS — Z6833 Body mass index (BMI) 33.0-33.9, adult: Secondary | ICD-10-CM | POA: Diagnosis not present

## 2014-03-16 DIAGNOSIS — R079 Chest pain, unspecified: Secondary | ICD-10-CM

## 2014-03-16 DIAGNOSIS — E669 Obesity, unspecified: Secondary | ICD-10-CM | POA: Insufficient documentation

## 2014-03-16 DIAGNOSIS — G4733 Obstructive sleep apnea (adult) (pediatric): Secondary | ICD-10-CM | POA: Diagnosis not present

## 2014-03-16 DIAGNOSIS — G4710 Hypersomnia, unspecified: Secondary | ICD-10-CM | POA: Diagnosis not present

## 2014-03-16 DIAGNOSIS — R0600 Dyspnea, unspecified: Secondary | ICD-10-CM

## 2014-03-16 DIAGNOSIS — R0789 Other chest pain: Secondary | ICD-10-CM

## 2014-03-16 DIAGNOSIS — R0609 Other forms of dyspnea: Secondary | ICD-10-CM

## 2014-03-17 ENCOUNTER — Telehealth: Payer: Self-pay | Admitting: Cardiology

## 2014-03-17 NOTE — Sleep Study (Signed)
   NAME: Ann Fernandez DATE OF BIRTH:  December 11, 1955 MEDICAL RECORD NUMBER [id number]  LOCATION: [facility]  PHYSICIAN: Faythe Heitzenrater R  DATE OF STUDY: 03/16/2014  SLEEP STUDY TYPE: Nocturnal Polysomnogram               REFERRING PHYSICIAN: Dorothy Spark, MD  INDICATION FOR STUDY: daytime sleepiness, difficulty sleeping  EPWORTH SLEEPINESS SCORE: 6 HEIGHT: 5'5"  WEIGHT: 198lbs. NECK SIZE: 16 in. BMI:  33  MEDICATIONS: Reviewed in attached chart  SLEEP ARCHITECTURE: The patient's total sleep time was 305 minutes of which there was no slow wave sleep and time spent in REM sleep was 61 minutes.  The sleep latency was 19 minutes and latency to REM was delayed at 143 minutes.  The sleep Efficiency was reduced at 73%.  RESPIRATORY DATA: The patient had a total of 7 obstructive apneas and 56 hypopneas.  Most apneas occurred during REM sleep with equal distribution between supine and nonsupine position.  Most hypopneas occurred as well during REM sleep with no change in position.  The overall AHI was 12.4 events per hour and 48.8 events per hour during REM sleep.  OXYGEN DATA: The Baseline O2 sat was 93% with the lowest O2 sat during REM sleep at 75% and lowest during NREM sleep at 85%.  CARDIAC DATA: There were no cardiac arrhythmias noted during the study.  MOVEMENT/PARASOMNIA: There were no significant periodic limb movements or sleep disordered behaviors noted.   IMPRESSION/ RECOMMENDATION:   1.  Mild obstructive sleep apnea/hypopnea syndrome was noted with an overall AHI of 12.4 events per hour but significantly higher during REM sleep at 48.8 events per hour with O2 desaturations of 75%.  Given the patient's symptoms of daytime sleepiness and poor sleep recommend a trial of CPAP therapy.   2.  Obesity with BMI 33.  The patient should be counseled in weight loss and good sleep hygiene. 3,  Will proceed with expeditious CPAP titrated in sleep  lab.  Signed: Sueanne Margarita Diplomate, American Board of Sleep Medicine  ELECTRONICALLY SIGNED ON:  03/17/2014, 8:59 AM [place] PH: [phone]   FX: [phone] [place]

## 2014-03-17 NOTE — Telephone Encounter (Signed)
Please let patient know that she has mild sleep apnea overall but during her dream states of sleep it is severe with significant drops in her O2 saturations.  I would like her to proceed with CPAP titration.  Please forward this to [facility] to schedule.

## 2014-03-17 NOTE — Telephone Encounter (Signed)
Left message to call back  

## 2014-03-20 ENCOUNTER — Telehealth: Payer: Self-pay | Admitting: Cardiology

## 2014-03-20 NOTE — Telephone Encounter (Signed)
New problem   Pt returning call to discuss sleep study results. Please call pt.

## 2014-03-20 NOTE — Telephone Encounter (Signed)
Contacted the pt to inform her that per Dr Radford Pax she has mild sleep apnea overall but during her dream states of sleep it is severe with significant drops in her O2 saturations. Informed the pt that Dr Radford Pax would like for her to proceed with CPAP titration.  Per Dr Radford Pax, we will  forward this message to [facility] to schedule. Pt verbalized understanding and agrees with this plan.

## 2014-03-21 ENCOUNTER — Other Ambulatory Visit: Payer: Self-pay | Admitting: Hematology

## 2014-03-21 ENCOUNTER — Telehealth: Payer: Self-pay | Admitting: *Deleted

## 2014-03-21 DIAGNOSIS — G4730 Sleep apnea, unspecified: Secondary | ICD-10-CM

## 2014-03-21 NOTE — Telephone Encounter (Signed)
Message copied by Nuala Alpha on Tue Mar 21, 2014 10:06 AM ------      Message from: MCVEY, Virginia K      Created: Tue Mar 21, 2014 10:03 AM      Regarding: RE: cpap titration       Ann Fernandez will need order for CPAP titration before the sleep lab can schedule, thanks            [employer] [place]            ----- Message -----         From: Nuala Alpha, LPN         Sent: 26/83/4196   2:16 PM           To: Megan Salon McVey      Subject: cpap titration                                           Contacted the pt to inform her that per Dr Radford Pax she has mild sleep apnea overall but during her dream states of sleep it is severe with significant drops in her O2 saturations.      Informed the pt that Dr Radford Pax would like for her to proceed with CPAP titration.       Per Dr Radford Pax, we will  forward this message to [facility] to schedule.      Pt verbalized understanding and agrees with this plan.  Can you arrange this please?            Thanks for everything,            Ann Fernandez       ------

## 2014-03-21 NOTE — Telephone Encounter (Signed)
Addressed in phone note 03/20/14.

## 2014-03-21 NOTE — Telephone Encounter (Signed)
CPAP Titration order placed in epic per Dr Radford Pax and [facility] Gearlean Alf.

## 2014-03-23 ENCOUNTER — Other Ambulatory Visit: Payer: Self-pay | Admitting: Emergency Medicine

## 2014-03-23 DIAGNOSIS — N6099 Unspecified benign mammary dysplasia of unspecified breast: Secondary | ICD-10-CM

## 2014-03-23 MED ORDER — ANASTROZOLE 1 MG PO TABS: 1.0000 mg | ORAL_TABLET | Freq: Every day | ORAL | Status: DC

## 2014-03-24 DIAGNOSIS — G4733 Obstructive sleep apnea (adult) (pediatric): Secondary | ICD-10-CM

## 2014-03-24 DIAGNOSIS — Z9989 Dependence on other enabling machines and devices: Secondary | ICD-10-CM | POA: Insufficient documentation

## 2014-03-24 HISTORY — DX: Obstructive sleep apnea (adult) (pediatric): G47.33

## 2014-03-27 ENCOUNTER — Encounter: Payer: Self-pay | Admitting: Cardiology

## 2014-03-27 ENCOUNTER — Ambulatory Visit (INDEPENDENT_AMBULATORY_CARE_PROVIDER_SITE_OTHER): Payer: BC Managed Care – PPO | Admitting: Cardiology

## 2014-03-27 VITALS — BP 124/80 | HR 70 | Ht 64.0 in | Wt 212.0 lb

## 2014-03-27 DIAGNOSIS — I5030 Unspecified diastolic (congestive) heart failure: Secondary | ICD-10-CM | POA: Insufficient documentation

## 2014-03-27 NOTE — Patient Instructions (Signed)
Your physician wants you to follow-up in:   [place] will receive a reminder letter in the mail two months in advance. If you don't receive a letter, please call our office to schedule the follow-up appointment. Your physician recommends that you continue on your current medications as directed. Please refer to the Current Medication list given to you today.

## 2014-03-27 NOTE — Progress Notes (Signed)
Patient ID: HARRY BARK, female   DOB: 1955-09-24, 58 y.o.   MRN: [id number]    Patient Name: Ann Fernandez Date of Encounter: 03/27/2014  Primary Care Provider:  Chancy Hurter, MD Primary Cardiologist:  Dorothy Spark  Problem List   Past Medical History  Diagnosis Date  . Allergy   . Hyperlipidemia   . Hypertension   . Diverticulosis of colon   . DDD (degenerative disc disease)   . IBS (irritable bowel syndrome)   . PONV (postoperative nausea and vomiting)   . Anxiety     regarding  upcoming breast surgery  . Wears glasses   . GERD (gastroesophageal reflux disease)    Past Surgical History  Procedure Laterality Date  . Cervical laminectomy    . Sinus irrigation    . Trigger finger release    . Knee arthroscopy    . Cyst removal trunk      LOWER BACK  . Abdominal hysterectomy  1994    TVH for DUB--ovaries retained    . Knee arthroscopy Right 05/25/2013    Procedure: RIGHT ARTHROSCOPY KNEE, PARTIAL MEDIAL MENISCECTOMY, CHONDROPLASTY, PARTIAL LATERAL MENISCECTOMY;  Surgeon: Gearlean Alf, MD;  Location: [facility];  Service: Orthopedics;  Laterality: Right;  . Back surgery  2011    lumb cyst  . Tonsillectomy    . Wisdom tooth extraction    . Dilation and curettage of uterus    . Partial mastectomy with needle localization Left 06/03/2013    Procedure: PARTIAL MASTECTOMY WITH NEEDLE LOCALIZATION;  Surgeon: [name], MD;  Location: [facility];  Service: General;  Laterality: Left;  . Breast surgery     Allergies  Allergies  Allergen Reactions  . Amlodipine Besylate     REACTION: edema  . Darvocet [Propoxyphene N-Acetaminophen]     insomnia  . Demerol [Meperidine]     sweating, very hot  . Hydrochlorothiazide W-Triamterene     REACTION: hives  . Lipitor [Atorvastatin] Other (See Comments)    Leg pain   . Lisinopril     REACTION: cough  . Meperidine Hcl     REACTION: flushed sensation---treated urgently with benadryl during  medical procedure  . Penicillins Hives    As a child  . Simvastatin     REACTION: made legs hurt  . Sulfamethoxazole Diarrhea    With  Bloody stools  X 2    HPI  A very pleasant 58 year old female with h/o benign breast mass, s/p resection who was placed on Tamoxifen and started to experience exertional SOB, chest pain that resolved at rest, she ended up in the ER where ACS was ruled out. Tamoxifen was discontinued with relief of symptoms. She is currently taking anastrozole.  She has very significant family h/o of premature CAD, her brother had MI at age 35 --> 4V CABG, died of pulmonary embolism at age 85. Mother had CABG at age 60, father MI at age 43, died at 47. She has never smoked. She is obese but fairly active, taking care of her 3 grandchildren, she is retired. While taking tamoxifen she also had episode of PND, no palpitations or syncope.   03/27/2014 - the patient is coming for follow-up, she states she is very happy that her results were normal. She feels significantly better since her tamoxifen was discontinued and slowly her symptoms are disappearing. She is trying to exercise multiple times a week only limitation is right knee pain for which she might need  a knee replacement in the near future. She has been evaluated for sleep apnea and was diagnosed with significant desaturation on multiple times at night, she is currently scheduled for Cipro machine titration with Dr. Radford Pax.  Home Medications  Prior to Admission medications   Medication Sig Start Date End Date Taking? Authorizing Provider  acetaminophen (PAIN RELIEVER EXTRA STRENGTH) 500 MG tablet Take by mouth as needed.  05/30/10  Yes Historical Provider, MD  anastrozole (ARIMIDEX) 1 MG tablet Take 1 tablet (1 mg total) by mouth daily. 01/09/14  Yes Aasim Marla Roe, MD  calcium-vitamin D (OSCAL WITH D) 500-200 MG-UNIT per tablet Take 1 tablet by mouth 2 (two) times daily.   Yes Historical Provider, MD  diclofenac  (VOLTAREN) 75 MG EC tablet Take 75 mg by mouth daily.    Yes Historical Provider, MD  fexofenadine (ALLEGRA) 180 MG tablet Take 180 mg by mouth at bedtime.    Yes Historical Provider, MD  fish oil-omega-3 fatty acids 1000 MG capsule Take 2 g by mouth daily.     Yes Historical Provider, MD  hydrochlorothiazide (HYDRODIURIL) 25 MG tablet TAKE ONE HALF TABLET DAILY. 09/26/13  Yes Lisabeth Pick, MD  meclizine (ANTIVERT) 25 MG tablet Take 25 mg by mouth as needed.  08/17/12  Yes Historical Provider, MD  methylcellulose (CITRUCEL) oral powder Take 1 packet by mouth at bedtime.    Yes Historical Provider, MD  mometasone (NASONEX) 50 MCG/ACT nasal spray Place 2 sprays into the nose daily.   Yes Historical Provider, MD  nebivolol (BYSTOLIC) 10 MG tablet Take 10 mg by mouth every morning.   Yes Historical Provider, MD  omeprazole (PRILOSEC) 20 MG capsule Take 20 mg by mouth as needed.    Yes Historical Provider, MD  pravastatin (PRAVACHOL) 40 MG tablet Take 40 mg by mouth at bedtime.   Yes Historical Provider, MD    Family History  Family History  Problem Relation Age of Onset  . Heart disease Mother   . Deep vein thrombosis Mother   . Colon cancer Mother   . Rectal cancer Mother   . Lung disease Mother   . Heart disease Father   . COPD Father   . Rheum arthritis Father   . Osteoarthritis Father   . Heart disease Brother   . Rheum arthritis Daughter   . Pulmonary embolism Brother     Social History  History   Social History  . Marital Status: Married    Spouse Name: N/A    Number of Children: N/A  . Years of Education: N/A   Occupational History  . Not on file.   Social History Main Topics  . Smoking status: Never Smoker   . Smokeless tobacco: Never Used  . Alcohol Use: No  . Drug Use: No  . Sexual Activity:    Partners: Male     Comment: TVH   Other Topics Concern  . Not on file   Social History Narrative     Review of Systems, as per HPI, otherwise negative General:   No chills, fever, night sweats or weight changes.  Cardiovascular:  No chest pain, dyspnea on exertion, edema, orthopnea, palpitations, paroxysmal nocturnal dyspnea. Dermatological: No rash, lesions/masses Respiratory: No cough, dyspnea Urologic: No hematuria, dysuria Abdominal:   No nausea, vomiting, diarrhea, bright red blood per rectum, melena, or hematemesis Neurologic:  No visual changes, wkns, changes in mental status. All other systems reviewed and are otherwise negative except as noted above.  Physical Exam  Blood pressure 124/80, pulse 70, height 5\' 4"  (1.626 m), weight 212 lb (96.163 kg), SpO2 97 %.  General: Pleasant, NAD Psych: Normal affect. Neuro: Alert and oriented X 3. Moves all extremities spontaneously. HEENT: Normal  Neck: Supple without bruits or JVD. Lungs:  Resp regular and unlabored, CTA. Heart: RRR no s3, s4, or murmurs. Abdomen: Soft, non-tender, non-distended, BS + x 4.  Extremities: No clubbing, cyanosis or edema. DP/PT/Radials 2+ and equal bilaterally.  Labs:  No results for input(s): CKTOTAL, CKMB, TROPONINI in the last 72 hours. Lab Results  Component Value Date   WBC 6.5 02/13/2014   HGB 14.2 02/13/2014   HCT 42.6 02/13/2014   MCV 91.9 02/13/2014   PLT 179 02/13/2014    No results found for: DDIMER Invalid input(s): POCBNP    Component Value Date/Time   NA 143 02/13/2014 0928   NA 140 08/15/2013 1053   K 3.9 02/13/2014 0928   K 4.0 08/15/2013 1053   CL 104 08/15/2013 1053   CO2 25 02/13/2014 0928   CO2 28 08/15/2013 1053   GLUCOSE 105 02/13/2014 0928   GLUCOSE 90 08/15/2013 1053   BUN 11.5 02/13/2014 0928   BUN 12 08/15/2013 1053   CREATININE 0.7 02/13/2014 0928   CREATININE 0.6 08/15/2013 1053   CALCIUM 9.4 02/13/2014 0928   CALCIUM 9.5 08/15/2013 1053   PROT 6.3* 12/15/2013 0952   PROT 6.7 08/15/2013 1053   ALBUMIN 3.7 12/15/2013 0952   ALBUMIN 4.2 08/15/2013 1053   AST 23 12/15/2013 0952   AST 28 08/15/2013 1053   ALT 32  12/15/2013 0952   ALT 45* 08/15/2013 1053   ALKPHOS 48 12/15/2013 0952   ALKPHOS 64 08/15/2013 1053   BILITOT 0.74 12/15/2013 0952   BILITOT 1.2 08/15/2013 1053   GFRNONAA >90 05/16/2013 1425   GFRAA >90 05/16/2013 1425   Lab Results  Component Value Date   CHOL 227* 08/15/2013   HDL 40.70 08/15/2013   LDLCALC 146* 08/15/2013   TRIG 203.0* 08/15/2013   TSH 1.78 HbA1c 5.7%  Accessory Clinical Findings  Echocardiogram - 01/27/2014 Left ventricle: The cavity size was normal. Wall thickness was increased in a pattern of mild LVH. Systolic function was normal. The estimated ejection fraction was in the range of 60% to 65%. Wall motion was normal; there were no regional wall motion abnormalities. Features are consistent with a pseudonormal left ventricular filling pattern, with concomitant abnormal relaxation and increased filling pressure (grade 2 diastolic dysfunction). - Left atrium: The atrium was mildly dilated. Anterior-posterior dimension: 45 mm. 31.23ml/m2 - Index.  ECG - SR, possible septal infarct (age undetermined)  Lexiscan Nuclear Stress Test: 01/27/2014 Quantitative Gated Spect Images QGS EDV: 82 ml QGS ESV: 29 ml  Impression Exercise Capacity: Lexiscan with low level exercise. BP Response: Normal blood pressure response. Clinical Symptoms: No significant symptoms noted. ECG Impression: No significant ST segment change suggestive of ischemia. Comparison with Prior Nuclear Study: No images to compare  Overall Impression: Normal stress nuclear study.  LV Ejection Fraction: 64%. LV Wall Motion: NL LV Function; NL Wall Motion    Assessment & Plan  A very pleasant 58 year old female  1. Typical exertional CP and DOE - CHF with preserved ejection fraction - echocardiogram showed preserved left ventricle ejection fraction and pseudo-normal pattern of diastolic dysfunction with mild LVH and mildly dilated left atrium. Contributing factor to  these might be sleep apnea and hypertension. Her nebivolol was recently increased from 5-10 mg daily, I strongly agreed with  that. I also asked support her in using C Pap. She is reluctant but after it being explained about negative impact of sleep apnea on congestive heart failure she understands and is willing to use it. There is no ischemia on her stress test.  2. HTN - currently well controlled, however mild LVH and grade 2 diastolic dysfunction, normal blood pressure response on stress test and nebivolol's recently increased to 10 mg daily we will just continue the same regimen for now  3. Hyperlipidemia - intolerant to multiple statins, currently on Pravastatin 40 mg daily  4. Obstructive sleep apnea - had a long discussion with her about importance of using CPAP machine as much as possible she is also advised to lose weight through exercise she is very motivated.  Follow-up in one year   Dorothy Spark, MD, [facility] 03/27/2014, 4:15 PM

## 2014-04-13 ENCOUNTER — Ambulatory Visit (HOSPITAL_BASED_OUTPATIENT_CLINIC_OR_DEPARTMENT_OTHER): Payer: BC Managed Care – PPO | Attending: Cardiology

## 2014-04-13 DIAGNOSIS — R0683 Snoring: Secondary | ICD-10-CM | POA: Diagnosis present

## 2014-04-13 DIAGNOSIS — G4710 Hypersomnia, unspecified: Secondary | ICD-10-CM | POA: Insufficient documentation

## 2014-04-13 DIAGNOSIS — Z6833 Body mass index (BMI) 33.0-33.9, adult: Secondary | ICD-10-CM | POA: Diagnosis not present

## 2014-04-13 DIAGNOSIS — G4733 Obstructive sleep apnea (adult) (pediatric): Secondary | ICD-10-CM | POA: Insufficient documentation

## 2014-04-13 DIAGNOSIS — G4730 Sleep apnea, unspecified: Secondary | ICD-10-CM

## 2014-04-14 ENCOUNTER — Telehealth: Payer: Self-pay | Admitting: Cardiology

## 2014-04-14 DIAGNOSIS — G4733 Obstructive sleep apnea (adult) (pediatric): Secondary | ICD-10-CM

## 2014-04-14 NOTE — Telephone Encounter (Signed)
Informed patient's husband that Ann Fernandez will get a phone call soon regarding CPAP supplies.  Left message with Shanon Brow to have Mikka call to set up an appointment in 10 weeks.

## 2014-04-14 NOTE — Addendum Note (Signed)
Addended by: Harland German A on: 04/14/2014 05:39 PM   Modules accepted: Orders

## 2014-04-14 NOTE — Sleep Study (Signed)
   NAME: Ann Fernandez DATE OF BIRTH:  10/15/1955 MEDICAL RECORD NUMBER [id number]  LOCATION: [facility]  PHYSICIAN: TURNER,TRACI R  DATE OF STUDY: 04/13/2014  SLEEP STUDY TYPE: Positive Airway Pressure Titration               REFERRING PHYSICIAN: Dorothy Spark, MD  INDICATION FOR STUDY: Mild OSA with daytime hypersomnia, snoring, nonrestorative sleep  EPWORTH SLEEPINESS SCORE: 6 HEIGHT: 5'5" WEIGHT: 198lbs      NECK SIZE: 16 in. BMI: 33 MEDICATIONS: Reviewed in the chart  SLEEP ARCHITECTURE: The patient slept for a total of 288 minutes with 45 minutes of slow wave sleep and 45 minutes of REM sleep.  The percent sleep efficiency was reduced at 78%.  The onset to sleep latency was 16 minutes and the onset the REM sleep was 101 minutes.    RESPIRATORY DATA: The patient was started on CPAP at 5cm H2O and titrated to 10cm H2O.  The patient tried to sleep supine but was unable to maintain the supine position at optimal pressure for more than 9 minutes.  Her AHI at a CPAP of 7cm H2O was 5.4 events per hour and at 10cm H2O the AHI was 7.7 events per hour.    OXYGEN DATA: The nadir O2 sat during REM sleep was 73% and during NREM sleep was 82%.  Total time with O2 sats < 88% was 6.5 minutes.  CARDIAC DATA: The patient maintained NSR throughout the study.   MOVEMENT/PARASOMNIA: There were no signficant periodic limb movement disorders or sleep behavioral disorders noted.    IMPRESSION/ RECOMMENDATION:   1.  Mild obstructive sleep apnea/hypopnea syndrome with AHI during PSG of 12 events per hour. 2.  Successful CPAP titration in the lateral position during REM sleep but not in the supine position.  The patient stated that she cannot sleep on her back and sleeps on her side at home. 3.  O2 desaturations were noted despite CPAP at optimal pressure.   4.  Recommend Resmed CPAP device with heated humidity, C flex setting of 10cm H2O with small [employer] full face  mask. 5.  Overnight pulse oximetry will be ordered to determine if patient needs O2 at night with CPAP.  6.  Followup with Dr. Radford Pax in 10 weeks  Signed: Sueanne Margarita Diplomate, American Board of Sleep Medicine  ELECTRONICALLY SIGNED ON:  04/14/2014, 12:56 PM [place] PH: [phone]   FX: [phone] [place]

## 2014-04-14 NOTE — Telephone Encounter (Signed)
Please let patient know that she had a successful CPAP titration.  Please order Resmed CPAP with heated humidifier, C flex of 3 , small [employer] Simplus full face mask and a setting of 10cm H2O.  Please order an overnight pulse oximetry on CPAP to determine if patient needs overnight O2.  Set up OV with me in 10 weeks.

## 2014-04-14 NOTE — Telephone Encounter (Signed)
Orders placed and message sent to [facility] for scheduling.

## 2014-04-17 ENCOUNTER — Telehealth: Payer: Self-pay | Admitting: Cardiology

## 2014-04-17 NOTE — Telephone Encounter (Signed)
Appointment made for 06/27/14.

## 2014-04-17 NOTE — Telephone Encounter (Signed)
New problem   Pt need to speak to nurse concerning an up coming appt. Please call pt.

## 2014-05-03 ENCOUNTER — Encounter: Payer: Self-pay | Admitting: Cardiology

## 2014-05-05 ENCOUNTER — Ambulatory Visit: Payer: BC Managed Care – PPO | Admitting: Obstetrics and Gynecology

## 2014-05-05 ENCOUNTER — Ambulatory Visit (INDEPENDENT_AMBULATORY_CARE_PROVIDER_SITE_OTHER): Payer: BC Managed Care – PPO | Admitting: Obstetrics and Gynecology

## 2014-05-05 ENCOUNTER — Encounter: Payer: Self-pay | Admitting: Obstetrics and Gynecology

## 2014-05-05 VITALS — BP 138/88 | HR 78 | Resp 18 | Ht 64.0 in | Wt 209.6 lb

## 2014-05-05 DIAGNOSIS — Z0000 Encounter for general adult medical examination without abnormal findings: Secondary | ICD-10-CM

## 2014-05-05 DIAGNOSIS — R82998 Other abnormal findings in urine: Secondary | ICD-10-CM

## 2014-05-05 DIAGNOSIS — N390 Urinary tract infection, site not specified: Secondary | ICD-10-CM

## 2014-05-05 DIAGNOSIS — Z01419 Encounter for gynecological examination (general) (routine) without abnormal findings: Secondary | ICD-10-CM

## 2014-05-05 LAB — POCT URINALYSIS DIPSTICK
Urobilinogen, UA: NEGATIVE
pH, UA: 5

## 2014-05-05 LAB — URINALYSIS, MICROSCOPIC ONLY
Bacteria, UA: NONE SEEN
CASTS: NONE SEEN
Crystals: NONE SEEN
Squamous Epithelial / LPF: NONE SEEN

## 2014-05-05 NOTE — Progress Notes (Signed)
58 y.o. [id number] MarriedCaucasianF here for annual exam.    After last year had a new diagnosis of left breast mass.. Initially, attention was called to the right breast at 7:00. Patient had a normal mammogram and ultrasound of right breast but a mass was found in the left on imaging.  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  Status post partial left mastectomy.  Took Tamoxifen from March to August 2015.  Then switched to Arimidex and felt better, so stopped.   Has known rectocele. Notices rectal pressure and not having incomplete evacuation at times.  Using Citrucel.  No fecal incontinence.   Did a sleep study and was diagnosed with "mild sleep apnea." Has CPAP machine.   Having right knee injections with cortisone.  May need replacement.   No LMP recorded. Patient has had a hysterectomy.          Sexually active: Yes.    The current method of family planning is status post hysterectomy.   Ovaries retained.  Exercising: Yes.    Walking 2 miles qd when she has cortisone injection in right knee      Smoker:  no  Health Maintenance: Pap:  02/23/2008 Negative  History of abnormal Pap:  no MMG:  05/01/2014 Normal ; hx of mass removed from left breast- Benign   Colonoscopy:  08/2012- Normal f/u in 5 years  BMD:   01/05/14 - normal.  TDaP:  12/21/2006 Screening Labs: Renford Dills, MD , Urine today: Leuks ++ - no dysuria.  No history of UTIs.    reports that she has never smoked. She has never used smokeless tobacco. She reports that she does not drink alcohol or use illicit drugs.  Past Medical History  Diagnosis Date  . Allergy   . Hyperlipidemia   . Hypertension   . Diverticulosis of colon   . DDD (degenerative disc disease)   . IBS (irritable bowel syndrome)   . PONV (postoperative nausea and vomiting)   . Anxiety     regarding  upcoming breast surgery  . Wears glasses   . GERD (gastroesophageal reflux disease)     Past Surgical History  Procedure Laterality  Date  . Cervical laminectomy    . Sinus irrigation    . Trigger finger release    . Knee arthroscopy    . Cyst removal trunk      LOWER BACK  . Abdominal hysterectomy  1994    TVH for DUB--ovaries retained    . Knee arthroscopy Right 05/25/2013    Procedure: RIGHT ARTHROSCOPY KNEE, PARTIAL MEDIAL MENISCECTOMY, CHONDROPLASTY, PARTIAL LATERAL MENISCECTOMY;  Surgeon: Gearlean Alf, MD;  Location: [facility];  Service: Orthopedics;  Laterality: Right;  . Back surgery  2011    lumb cyst  . Tonsillectomy    . Wisdom tooth extraction    . Dilation and curettage of uterus    . Partial mastectomy with needle localization Left 06/03/2013    Procedure: PARTIAL MASTECTOMY WITH NEEDLE LOCALIZATION;  Surgeon: [name], MD;  Location: [place];  Service: General;  Laterality: Left;  . Breast surgery      Current Outpatient Prescriptions  Medication Sig Dispense Refill  . acetaminophen (PAIN RELIEVER EXTRA STRENGTH) 500 MG tablet Take by mouth as needed.     Marland Kitchen anastrozole (ARIMIDEX) 1 MG tablet Take 1 tablet (1 mg total) by mouth daily. 30 tablet 11  . calcium-vitamin D (OSCAL WITH D) 500-200 MG-UNIT per tablet Take 1 tablet by mouth  2 (two) times daily.    . diclofenac (VOLTAREN) 75 MG EC tablet Take 75 mg by mouth daily.     . fexofenadine (ALLEGRA) 180 MG tablet Take 180 mg by mouth at bedtime.     . fish oil-omega-3 fatty acids 1000 MG capsule Take 2 g by mouth daily.      . hydrochlorothiazide (HYDRODIURIL) 25 MG tablet TAKE ONE HALF TABLET DAILY.    . meclizine (ANTIVERT) 25 MG tablet Take 25 mg by mouth as needed.     . methylcellulose (CITRUCEL) oral powder Take 1 packet by mouth at bedtime.     . mometasone (NASONEX) 50 MCG/ACT nasal spray Place 2 sprays into the nose daily.    . nebivolol (BYSTOLIC) 10 MG tablet Take 10 mg by mouth every morning.    Marland Kitchen omeprazole (PRILOSEC) 20 MG capsule Take 20 mg by mouth as needed.     . pravastatin (PRAVACHOL) 40 MG tablet Take 40  mg by mouth at bedtime.     No current facility-administered medications for this visit.    Family History  Problem Relation Age of Onset  . Heart disease Mother   . Deep vein thrombosis Mother   . Colon cancer Mother   . Rectal cancer Mother   . Lung disease Mother   . Heart disease Father   . COPD Father   . Rheum arthritis Father   . Osteoarthritis Father   . Heart disease Brother   . Rheum arthritis Daughter   . Pulmonary embolism Brother     ROS:  Pertinent items are noted in HPI.  Otherwise, a comprehensive ROS was negative.  Exam:   BP 138/88 mmHg  Pulse 78  Resp 18  Ht 5\' 4"  (1.626 m)  Wt 209 lb 9.6 oz (95.074 kg)  BMI 35.96 kg/m2     Height: 5\' 4"  (162.6 cm)  Ht Readings from Last 3 Encounters:  05/05/14 5\' 4"  (1.626 m)  03/27/14 5\' 4"  (1.626 m)  02/13/14 5\' 4"  (1.626 m)    General appearance: alert, cooperative and appears stated age Head: Normocephalic, without obvious abnormality, atraumatic Neck: no adenopathy, supple, symmetrical, trachea midline and thyroid normal to inspection and palpation Lungs: clear to auscultation bilaterally Breasts: normal appearance, no masses or tenderness, No nipple retraction or dimpling, No nipple discharge or bleeding, No axillary or supraclavicular adenopathy, Normal to palpation without dominant masses, Right breast with continued ridge at 7:00 - no change.  Scar of left upper outer breast. Heart: regular rate and rhythm Abdomen: soft, non-tender; bowel sounds normal; no masses,  no organomegaly Extremities: extremities normal, atraumatic, no cyanosis or edema Skin: Skin color, texture, turgor normal. No rashes or lesions Lymph nodes: Cervical, supraclavicular, and axillary nodes normal. No abnormal inguinal nodes palpated Neurologic: Grossly normal   Pelvic: External genitalia:  no lesions              Urethra:  normal appearing urethra with no masses, tenderness or lesions              Bartholins and Skenes: normal                  Vagina: normal appearing vagina with normal color and discharge, no lesions. Almost third degree high rectocele.              Cervix: absent              Pap taken: No. Bimanual Exam:  Uterus:  uterus absent  Adnexa: normal adnexa and no mass, fullness, tenderness               Rectovaginal: Confirms               Anus:  normal sphincter tone, no lesions  A:  Well Woman with normal exam Status post TVH.  Ovaries remain.  Rectocele. History of atypical breast ductal hyperplasia. Leukocytes in urine.  Family history of rectal cancer.   P:   Mammogram yearly. Continue care with oncology as indicated.  pap smear not indicated.  ACOG handout on pelvic organ prolapse.  Discussed rectocele as well.  Encouraged stool softeners.  Return for increased symptoms or desire for repair or pessary.  Urine culture and micro.  Will not treat at this time.  return annually or prn  An After Visit Summary was printed and given to the patient.

## 2014-05-05 NOTE — Patient Instructions (Signed)

## 2014-05-06 LAB — URINE CULTURE
COLONY COUNT: NO GROWTH
Organism ID, Bacteria: NO GROWTH

## 2014-05-07 ENCOUNTER — Encounter: Payer: Self-pay | Admitting: Cardiology

## 2014-05-12 ENCOUNTER — Telehealth: Payer: Self-pay

## 2014-05-12 NOTE — Telephone Encounter (Signed)
Mammogram results dtd 05/01/14 rcvd from [facility].  Copy to Dr Lindi Adie.  Original to scan.

## 2014-06-08 ENCOUNTER — Ambulatory Visit (HOSPITAL_BASED_OUTPATIENT_CLINIC_OR_DEPARTMENT_OTHER): Payer: BC Managed Care – PPO | Admitting: Hematology and Oncology

## 2014-06-08 ENCOUNTER — Other Ambulatory Visit (HOSPITAL_BASED_OUTPATIENT_CLINIC_OR_DEPARTMENT_OTHER): Payer: BC Managed Care – PPO

## 2014-06-08 ENCOUNTER — Telehealth: Payer: Self-pay | Admitting: Hematology and Oncology

## 2014-06-08 DIAGNOSIS — N62 Hypertrophy of breast: Secondary | ICD-10-CM

## 2014-06-08 DIAGNOSIS — N6099 Unspecified benign mammary dysplasia of unspecified breast: Secondary | ICD-10-CM

## 2014-06-08 LAB — COMPREHENSIVE METABOLIC PANEL (CC13)
ALT: 34 U/L (ref 0–55)
AST: 20 U/L (ref 5–34)
Albumin: 4 g/dL (ref 3.5–5.0)
Alkaline Phosphatase: 66 U/L (ref 40–150)
Anion Gap: 10 mEq/L (ref 3–11)
BILIRUBIN TOTAL: 0.83 mg/dL (ref 0.20–1.20)
BUN: 14.8 mg/dL (ref 7.0–26.0)
CO2: 27 meq/L (ref 22–29)
CREATININE: 0.8 mg/dL (ref 0.6–1.1)
Calcium: 9.6 mg/dL (ref 8.4–10.4)
Chloride: 105 mEq/L (ref 98–109)
EGFR: 88 mL/min/{1.73_m2} — ABNORMAL LOW (ref >90)
GLUCOSE: 92 mg/dL (ref 70–140)
POTASSIUM: 4.4 meq/L (ref 3.5–5.1)
Sodium: 142 mEq/L (ref 136–145)
Total Protein: 6.7 g/dL (ref 6.4–8.3)

## 2014-06-08 LAB — CBC WITH DIFFERENTIAL/PLATELET
BASO%: 0.5 % (ref 0.0–2.0)
Basophils Absolute: 0 10*3/uL (ref 0.0–0.1)
EOS ABS: 0.2 10*3/uL (ref 0.0–0.5)
EOS%: 3 % (ref 0.0–7.0)
HCT: 43.9 % (ref 34.8–46.6)
HGB: 14.3 g/dL (ref 11.6–15.9)
LYMPH%: 27.5 % (ref 14.0–49.7)
MCH: 30.2 pg (ref 25.1–34.0)
MCHC: 32.7 g/dL (ref 31.5–36.0)
MCV: 92.6 fL (ref 79.5–101.0)
MONO#: 0.6 10*3/uL (ref 0.1–0.9)
MONO%: 8.5 % (ref 0.0–14.0)
NEUT#: 4.4 10*3/uL (ref 1.5–6.5)
NEUT%: 60.5 % (ref 38.4–76.8)
PLATELETS: 195 10*3/uL (ref 145–400)
RBC: 4.74 10*6/uL (ref 3.70–5.45)
RDW: 13.9 % (ref 11.2–14.5)
WBC: 7.2 10*3/uL (ref 3.9–10.3)
lymph#: 2 10*3/uL (ref 0.9–3.3)

## 2014-06-08 NOTE — Assessment & Plan Note (Signed)
Left breast atypical ductal hyperplasia treated with lumpectomy but could not tolerate tamoxifen that was started in July 2015, could not tolerate Arimidex finally being discontinued after reducing the frequency of Arimidex to 4 days a week and could not tolerate that dose either as of 06/08/2014  Recommendation: 1. Annual breast exams and mammograms 2. I encouraged her to get the knee replacement which is scheduled to undergo in March 2016. So that she can exercise and lose weight and therefore decrease the risk of breast cancer that way. 3. I also discussed the importance of healthy eating and eating more fruits and vegetables and less red meat.  Patient agrees with these recommendations and will see [place] back in a year.

## 2014-06-08 NOTE — Progress Notes (Signed)
Patient Care Team: Lisabeth Pick, MD as PCP - General Neale Burly, MD as Consulting Physician (Internal Medicine)  DIAGNOSIS: Atypical ductal hyperplasia of left breast could not tolerate antiestrogen therapy for breast cancer risk reduction CHIEF COMPLIANT: Severe hot flashes, mood swings  INTERVAL HISTORY: Ann Fernandez is a 59 year old lady with above-mentioned history of left breast atypical ductal hyperplasia who was treated with tamoxifen initially later switched to Arimidex because of side effects. She could not tolerate either of these medications and she decided to take it for 4 days a week. In spite of that she could not tolerate them. She complains of severe hot flashes accompanied by mood changes and weight gain issues. She is also undergoing evaluation for knee replacement which will likely happen in March.  REVIEW OF SYSTEMS:   Constitutional: Denies fevers, chills or abnormal weight loss Eyes: Denies blurriness of vision Ears, nose, mouth, throat, and face: Denies mucositis or sore throat Respiratory: Denies cough, dyspnea or wheezes Cardiovascular: Denies palpitation, chest discomfort or lower extremity swelling Gastrointestinal:  Denies nausea, heartburn or change in bowel habits Skin: Denies abnormal skin rashes Lymphatics: Denies new lymphadenopathy or easy bruising Neurological:Denies numbness, tingling or new weaknesses Behavioral/Psych: Mood is stable, no new changes  Breast:  denies any pain or lumps or nodules in either breasts All other systems were reviewed with the patient and are negative.  I have reviewed the past medical history, past surgical history, social history and family history with the patient and they are unchanged from previous note.  ALLERGIES:  is allergic to amlodipine besylate; darvocet; demerol; hydrochlorothiazide w-triamterene; lipitor; lisinopril; meperidine hcl; penicillins; simvastatin; and sulfamethoxazole.  MEDICATIONS:  Current  Outpatient Prescriptions  Medication Sig Dispense Refill  . acetaminophen (PAIN RELIEVER EXTRA STRENGTH) 500 MG tablet Take by mouth as needed.     . calcium-vitamin D (OSCAL WITH D) 500-200 MG-UNIT per tablet Take 1 tablet by mouth 2 (two) times daily.    . diclofenac (VOLTAREN) 75 MG EC tablet Take 75 mg by mouth daily.     . fexofenadine (ALLEGRA) 180 MG tablet Take 180 mg by mouth at bedtime.     . fish oil-omega-3 fatty acids 1000 MG capsule Take 2 g by mouth daily.      . hydrochlorothiazide (HYDRODIURIL) 25 MG tablet TAKE ONE HALF TABLET DAILY.    . meclizine (ANTIVERT) 25 MG tablet Take 25 mg by mouth as needed.     . methylcellulose (CITRUCEL) oral powder Take 1 packet by mouth at bedtime.     . mometasone (NASONEX) 50 MCG/ACT nasal spray Place 2 sprays into the nose daily.    . nebivolol (BYSTOLIC) 10 MG tablet Take 10 mg by mouth every morning.    Marland Kitchen omeprazole (PRILOSEC) 20 MG capsule Take 20 mg by mouth as needed.     . pravastatin (PRAVACHOL) 40 MG tablet Take 40 mg by mouth at bedtime.     No current facility-administered medications for this visit.    PHYSICAL EXAMINATION: ECOG PERFORMANCE STATUS: 1 - Symptomatic but completely ambulatory  Filed Vitals:   06/08/14 1028  BP: 164/76  Pulse: 55  Temp: 97.8 F (36.6 C)  Resp: 18   Filed Weights   06/08/14 1028  Weight: 219 lb 1.6 oz (99.383 kg)    GENERAL:alert, no distress and comfortable SKIN: skin color, texture, turgor are normal, no rashes or significant lesions EYES: normal, Conjunctiva are pink and non-injected, sclera clear OROPHARYNX:no exudate, no erythema and lips, buccal mucosa,  and tongue normal  NECK: supple, thyroid normal size, non-tender, without nodularity LYMPH:  no palpable lymphadenopathy in the cervical, axillary or inguinal LUNGS: clear to auscultation and percussion with normal breathing effort HEART: regular rate & rhythm and no murmurs and no lower extremity edema ABDOMEN:abdomen soft,  non-tender and normal bowel sounds Musculoskeletal:no cyanosis of digits and no clubbing  NEURO: alert & oriented x 3 with fluent speech, no focal motor/sensory deficits  LABORATORY DATA:  I have reviewed the data as listed   Chemistry      Component Value Date/Time   NA 142 06/08/2014 1015   NA 140 08/15/2013 1053   K 4.4 06/08/2014 1015   K 4.0 08/15/2013 1053   CL 104 08/15/2013 1053   CO2 27 06/08/2014 1015   CO2 28 08/15/2013 1053   BUN 14.8 06/08/2014 1015   BUN 12 08/15/2013 1053   CREATININE 0.8 06/08/2014 1015   CREATININE 0.6 08/15/2013 1053      Component Value Date/Time   CALCIUM 9.6 06/08/2014 1015   CALCIUM 9.5 08/15/2013 1053   ALKPHOS 66 06/08/2014 1015   ALKPHOS 64 08/15/2013 1053   AST 20 06/08/2014 1015   AST 28 08/15/2013 1053   ALT 34 06/08/2014 1015   ALT 45* 08/15/2013 1053   BILITOT 0.83 06/08/2014 1015   BILITOT 1.2 08/15/2013 1053       Lab Results  Component Value Date   WBC 7.2 06/08/2014   HGB 14.3 06/08/2014   HCT 43.9 06/08/2014   MCV 92.6 06/08/2014   PLT 195 06/08/2014   NEUTROABS 4.4 06/08/2014    ASSESSMENT & PLAN:  Atypical ductal hyperplasia, breast Left breast atypical ductal hyperplasia treated with lumpectomy but could not tolerate tamoxifen that was started in July 2015, could not tolerate Arimidex finally being discontinued after reducing the frequency of Arimidex to 4 days a week and could not tolerate that dose either as of 06/08/2014  Recommendation: 1. Annual breast exams and mammograms 2. I encouraged her to get the knee replacement which is scheduled to undergo in March 2016. So that she can exercise and lose weight and therefore decrease the risk of breast cancer that way. 3. I also discussed the importance of healthy eating and eating more fruits and vegetables and less red meat.  Patient agrees with these recommendations and will see [place] back in a year.    Orders Placed This Encounter  Procedures  . CBC  with Differential    Standing Status: Future     Number of Occurrences:      Standing Expiration Date: 06/08/2015  . Comprehensive metabolic panel (Cmet) - [facility]    Standing Status: Future     Number of Occurrences:      Standing Expiration Date: 06/08/2015   The patient has a good understanding of the overall plan. she agrees with it. She will call with any problems that may develop before her next visit here.   Rulon Eisenmenger, MD  11:43 AM

## 2014-06-27 ENCOUNTER — Encounter: Payer: Self-pay | Admitting: Cardiology

## 2014-06-27 ENCOUNTER — Telehealth: Payer: Self-pay | Admitting: Cardiology

## 2014-06-27 ENCOUNTER — Ambulatory Visit (INDEPENDENT_AMBULATORY_CARE_PROVIDER_SITE_OTHER): Payer: BC Managed Care – PPO | Admitting: Cardiology

## 2014-06-27 DIAGNOSIS — E669 Obesity, unspecified: Secondary | ICD-10-CM | POA: Insufficient documentation

## 2014-06-27 DIAGNOSIS — I10 Essential (primary) hypertension: Secondary | ICD-10-CM

## 2014-06-27 DIAGNOSIS — G4733 Obstructive sleep apnea (adult) (pediatric): Secondary | ICD-10-CM

## 2014-06-27 DIAGNOSIS — Z9989 Dependence on other enabling machines and devices: Secondary | ICD-10-CM

## 2014-06-27 HISTORY — DX: Obstructive sleep apnea (adult) (pediatric): G47.33

## 2014-06-27 HISTORY — DX: Obesity, unspecified: E66.9

## 2014-06-27 NOTE — Progress Notes (Addendum)
Cardiology Office Note   Date:  06/27/2014   ID:  [name], DOB 03/17/1956, MRN [id number]  PCP:  Chancy Hurter, MD  Sleep Medicine:   Sueanne Margarita, MD   Chief Complaint  Patient presents with  . Sleep Apnea  . Hypertension  . Obesity      History of Present Illness: Ann Fernandez is a 59 y.o. female who presents for evaluation of sleep apnea. She recently underwent PSG due to having problems with snoring but no witnessed apnea.  She had some nonrestorative sleep.  She did have some excessive daytime sleepiness with napping in the afternoon.  She has a family history of OSA.  The PSG showed mild OSA with an AHI of 12.4 events per hour.  She underwent CPAP titration to 10cm H2O.  She is doing well with her device.  She tolerates her full face mask without any problems.  She also was supposed to have overnight pulse oximetry to determined if she needs to have supplemental O2 at night with CPAP but that has not been done yet. She is doing well with her CPAP.  She feels rested in the am and has no daytime sleepiness.  Since going on CPAP    Past Medical History  Diagnosis Date  . Allergy   . Hyperlipidemia   . Hypertension   . Diverticulosis of colon   . DDD (degenerative disc disease)   . IBS (irritable bowel syndrome)   . PONV (postoperative nausea and vomiting)   . Anxiety     regarding  upcoming breast surgery  . Wears glasses   . GERD (gastroesophageal reflux disease)   . Mild sleep apnea   . OSA on CPAP 06/27/2014  . Obesity (BMI 30-39.9) 06/27/2014    Past Surgical History  Procedure Laterality Date  . Cervical laminectomy    . Sinus irrigation    . Trigger finger release    . Knee arthroscopy    . Cyst removal trunk      LOWER BACK  . Abdominal hysterectomy  1994    TVH for DUB--ovaries retained    . Knee arthroscopy Right 05/25/2013    Procedure: RIGHT ARTHROSCOPY KNEE, PARTIAL MEDIAL MENISCECTOMY, CHONDROPLASTY, PARTIAL LATERAL MENISCECTOMY;   Surgeon: Gearlean Alf, MD;  Location: [facility];  Service: Orthopedics;  Laterality: Right;  . Back surgery  2011    lumb cyst  . Tonsillectomy    . Wisdom tooth extraction    . Dilation and curettage of uterus    . Partial mastectomy with needle localization Left 06/03/2013    Procedure: PARTIAL MASTECTOMY WITH NEEDLE LOCALIZATION;  Surgeon: [name], MD;  Location: [place];  Service: General;  Laterality: Left;  . Breast surgery       Current Outpatient Prescriptions  Medication Sig Dispense Refill  . acetaminophen (PAIN RELIEVER EXTRA STRENGTH) 500 MG tablet Take by mouth as needed.     . calcium-vitamin D (OSCAL WITH D) 500-200 MG-UNIT per tablet Take 1 tablet by mouth 2 (two) times daily.    . diclofenac (VOLTAREN) 75 MG EC tablet Take 75 mg by mouth daily.     . fexofenadine (ALLEGRA) 180 MG tablet Take 180 mg by mouth at bedtime.     . fish oil-omega-3 fatty acids 1000 MG capsule Take 2 g by mouth daily.      . hydrochlorothiazide (HYDRODIURIL) 25 MG tablet TAKE ONE HALF TABLET DAILY.    . meclizine (ANTIVERT) 25 MG  tablet Take 25 mg by mouth as needed.     . methylcellulose (CITRUCEL) oral powder Take 1 packet by mouth at bedtime.     . mometasone (NASONEX) 50 MCG/ACT nasal spray Place 2 sprays into the nose daily.    . nebivolol (BYSTOLIC) 10 MG tablet Take 10 mg by mouth every morning.    Marland Kitchen omeprazole (PRILOSEC) 20 MG capsule Take 20 mg by mouth as needed.     . pravastatin (PRAVACHOL) 40 MG tablet Take 40 mg by mouth at bedtime.     No current facility-administered medications for this visit.    Allergies:   Amlodipine besylate; Darvocet; Demerol; Hydrochlorothiazide w-triamterene; Lipitor; Lisinopril; Meperidine hcl; Penicillins; Simvastatin; and Sulfamethoxazole    Social History:  The patient  reports that she has never smoked. She has never used smokeless tobacco. She reports that she does not drink alcohol or use illicit drugs.   Family  History:  The patient's family history includes COPD in her father; Colon cancer in her mother; Deep vein thrombosis in her mother; Heart disease in her brother, father, and mother; Lung disease in her mother; Osteoarthritis in her father; Pulmonary embolism in her brother; Rectal cancer in her mother; Rheum arthritis in her daughter and father.    ROS:  Please see the history of present illness.   Otherwise, review of systems are positive for none.   All other systems are reviewed and negative.    PHYSICAL EXAM: VS:  BP 170/88 mmHg  Pulse 64  Ht 5\' 5"  (1.651 m)  Wt 212 lb 6.4 oz (96.344 kg)  BMI 35.35 kg/m2  SpO2 98% , BMI Body mass index is 35.35 kg/(m^2). GEN: Well nourished, well developed, in no acute distress HEENT: normal Neck: no JVD, carotid bruits, or masses Cardiac: RRR; no murmurs, rubs, or gallops,no edema  Respiratory:  clear to auscultation bilaterally, normal work of breathing GI: soft, nontender, nondistended, + BS MS: no deformity or atrophy Skin: warm and dry, no rash Neuro:  Strength and sensation are intact Psych: euthymic mood, full affect   EKG:  EKG is not ordered today.    Recent Labs: 08/15/2013: TSH 1.41 06/08/2014: ALT 34; BUN 14.8; Creatinine 0.8; Hemoglobin 14.3; Platelets 195; Potassium 4.4; Sodium 142    Lipid Panel    Component Value Date/Time   CHOL 227* 08/15/2013 1053   TRIG 203.0* 08/15/2013 1053   HDL 40.70 08/15/2013 1053   CHOLHDL 6 08/15/2013 1053   VLDL 40.6* 08/15/2013 1053   LDLCALC 146* 08/15/2013 1053   LDLDIRECT 172.8 06/29/2012 1003      Wt Readings from Last 3 Encounters:  06/27/14 212 lb 6.4 oz (96.344 kg)  06/08/14 219 lb 1.6 oz (99.383 kg)  05/05/14 209 lb 9.6 oz (95.074 kg)      Other studies Reviewed: Additional studies/ records that were reviewed today include: PSG and CPAP. Review of the above records demonstrates: mild OSA with successful CPAP titration   ASSESSMENT AND PLAN:  1.  Mild OSA on CPAP and  tolerating well.  She has had improved daytime sleepiness and thinks she is sleeping better at night.  She would like to try the nasal pillow mask to see if she tolerates it and I will add a chin strap since she is a mouth breather. Her d/l today showed an AHI of 2.1/hr on 10cm H2O and 100% compliant in using more than 4 hours nightly.  .  I will also order an overnight pulse oximetry on CPAP to  determined if she needs supplemental O2 with her CPAP.  I will get a d/l 6 weeks after changing to the nasal mask 2.  HTN poorly controlled.  I have asked her to check her BP daily for a week and call with results 3.  Obesity - I have encouraged her to get into a good diet program and start an exercise program once she gets her knee fixed   Current medicines are reviewed at length with the patient today.  The patient does not have concerns regarding medicines.  The following changes have been made:  no change  Labs/ tests ordered today include: none    Disposition:   FU with 6 in 6 months   Signed, Sueanne Margarita, MD  06/27/2014 10:00 PM    [facility] [place], [place], [place]  [address] Phone: [phone]; Fax: [phone]

## 2014-06-27 NOTE — Telephone Encounter (Signed)
New problem   Pt need to know if she has CHF due to she is trying to get some different insurance. Please advise.

## 2014-06-27 NOTE — Patient Instructions (Signed)
Your physician recommends that you continue on your current medications as directed. Please refer to the Current Medication list given to you today.  Your physician wants you to follow-up in: 6 months. You will receive a reminder letter in the mail two months in advance. If you don't receive a letter, please call our office to schedule the follow-up appointment.  

## 2014-06-27 NOTE — Telephone Encounter (Signed)
Pt calling to confirm if she has CHF or not, to obtain her life insurance policy.  Informed the pt that according to Dr Francesca Oman last dictation note, the pt does have CHF with left ventricular diastolic dysfunction, NYHA class 2.  Pt requesting a copy of echo results be faxed to mailing address.  Informed the pt that I can send this.  Pt states he orthopedic Doctor will require this for upcoming knee surgery in beginning of March.  Confirmed address.  Pt verbalized understanding and pleased with all the assistance provided.

## 2014-07-04 ENCOUNTER — Telehealth: Payer: Self-pay | Admitting: Cardiology

## 2014-07-04 DIAGNOSIS — I10 Essential (primary) hypertension: Secondary | ICD-10-CM

## 2014-07-04 NOTE — Telephone Encounter (Signed)
BP still too high - increase HCTZ to 25mg  1 tablet daily  And check BP daily for a week and call with results.  Check a BMET in 1 week

## 2014-07-04 NOTE — Telephone Encounter (Signed)
Pt c/o BP issue: STAT if pt c/o blurred vision, one-sided weakness or slurred speech  1. What are your last 5 BP readings?  2.3.16    158/86    2.4.16    154/85 2.5.16    157/84 2.6.16    156/85 2.7.16    142/86 2.8.16    157/87 2.9.16    148/86 Pulse rate was 66 - 64 everyday  2. Are you having any other symptoms (ex. Dizziness, headache, blurred vision, passed out)? no  3. What is your BP issue? Pt was told by Dr Radford Pax to check her bp every morning she got up and call office today to give readings.  Please advise pt. Call pt if any questions.

## 2014-07-04 NOTE — Telephone Encounter (Signed)
Pt BP's routed to Dr. Radford Pax

## 2014-07-04 NOTE — Addendum Note (Signed)
Addended by: Lynann Bologna on: 07/04/2014 02:08 PM   Modules accepted: Orders

## 2014-07-04 NOTE — Telephone Encounter (Signed)
Pt is aware of Dr. Theodosia Blender recommendations. Pt to increased HCTZ to 25 mg once daily and to check her BP daily for a week and call with results. Pt is scheduled for BMET in one week 07/12/14. Pt verbalized understanding.

## 2014-07-05 ENCOUNTER — Encounter: Payer: Self-pay | Admitting: Cardiology

## 2014-07-10 ENCOUNTER — Encounter: Payer: Self-pay | Admitting: Cardiology

## 2014-07-12 ENCOUNTER — Telehealth: Payer: Self-pay | Admitting: Cardiology

## 2014-07-12 ENCOUNTER — Other Ambulatory Visit (INDEPENDENT_AMBULATORY_CARE_PROVIDER_SITE_OTHER): Payer: BC Managed Care – PPO | Admitting: *Deleted

## 2014-07-12 DIAGNOSIS — I10 Essential (primary) hypertension: Secondary | ICD-10-CM

## 2014-07-12 LAB — BASIC METABOLIC PANEL
BUN: 13 mg/dL (ref 6–23)
CALCIUM: 9.4 mg/dL (ref 8.4–10.5)
CO2: 28 mEq/L (ref 19–32)
Chloride: 103 mEq/L (ref 96–112)
Creatinine, Ser: 0.61 mg/dL (ref 0.40–1.20)
GFR: 106.9 mL/min (ref >60.00)
Glucose, Bld: 90 mg/dL (ref 70–99)
POTASSIUM: 3.7 meq/L (ref 3.5–5.1)
SODIUM: 140 meq/L (ref 135–145)

## 2014-07-12 NOTE — Telephone Encounter (Signed)
Pt c/o BP issue: STAT if pt c/o blurred vision, one-sided weakness or slurred speech  1. What are your last 5 BP readings? 147/84 P59, 136/77 P68, 142/81 P60, 152/84 P65, 131/80 P67, 140/82 P55, 137/81 P63, 152/82 P62  2. Are you having any other symptoms (ex. Dizziness, headache, blurred vision, passed out)? None  3. What is your BP issue? Pt calling stating that she was suppose to give Katy her bp readings for the past wee. Please call back and advise.

## 2014-07-12 NOTE — Telephone Encounter (Signed)
To Dr. Turner for review. 

## 2014-07-12 NOTE — Telephone Encounter (Signed)
Per Dr. Radford Pax, informed patient her BP is stable and to continue current medications.

## 2014-07-12 NOTE — Telephone Encounter (Signed)
Stable BP continue current meds

## 2014-07-17 DIAGNOSIS — Z8679 Personal history of other diseases of the circulatory system: Secondary | ICD-10-CM | POA: Insufficient documentation

## 2014-07-17 DIAGNOSIS — K219 Gastro-esophageal reflux disease without esophagitis: Secondary | ICD-10-CM | POA: Insufficient documentation

## 2014-07-17 DIAGNOSIS — E7800 Pure hypercholesterolemia, unspecified: Secondary | ICD-10-CM | POA: Insufficient documentation

## 2014-08-17 ENCOUNTER — Encounter: Payer: Self-pay | Admitting: Cardiology

## 2014-08-18 ENCOUNTER — Telehealth: Payer: Self-pay

## 2014-08-18 DIAGNOSIS — G4734 Idiopathic sleep related nonobstructive alveolar hypoventilation: Secondary | ICD-10-CM

## 2014-08-18 NOTE — Telephone Encounter (Signed)
-----   Message from Ann Margarita, MD sent at 08/17/2014 12:31 PM EDT ----- Patient has evidence of Oxygen desaturations as low as 76% at night on CPAP with over 11 mins consecutively with O2 sats <88%  Please add O2 at 2L via CPAP at night and recheck overnight oxygen sat in 2 weeks

## 2014-08-18 NOTE — Telephone Encounter (Signed)
Informed patient of results and verbal understanding expressed.   DME order placed for 2L O2 at night. Overnight pulse ox ordered to be scheduled in 2 weeks. [place] notified.

## 2014-08-23 ENCOUNTER — Encounter: Payer: Self-pay | Admitting: Cardiology

## 2014-08-25 ENCOUNTER — Telehealth: Payer: Self-pay | Admitting: *Deleted

## 2014-08-25 DIAGNOSIS — Z9989 Dependence on other enabling machines and devices: Principal | ICD-10-CM

## 2014-08-25 DIAGNOSIS — G4733 Obstructive sleep apnea (adult) (pediatric): Secondary | ICD-10-CM

## 2014-08-25 NOTE — Telephone Encounter (Signed)
Orders in for 2 week autotitration from DME from 4 to 20cm H2O Message sent to [facility]

## 2014-09-05 ENCOUNTER — Other Ambulatory Visit: Payer: Self-pay

## 2014-09-05 DIAGNOSIS — Z9989 Dependence on other enabling machines and devices: Secondary | ICD-10-CM

## 2014-09-05 DIAGNOSIS — G4733 Obstructive sleep apnea (adult) (pediatric): Secondary | ICD-10-CM

## 2014-09-05 DIAGNOSIS — E662 Morbid (severe) obesity with alveolar hypoventilation: Secondary | ICD-10-CM

## 2014-09-11 ENCOUNTER — Other Ambulatory Visit: Payer: Self-pay

## 2014-09-11 DIAGNOSIS — G4733 Obstructive sleep apnea (adult) (pediatric): Secondary | ICD-10-CM

## 2014-09-19 ENCOUNTER — Encounter: Payer: Self-pay | Admitting: Cardiology

## 2014-09-19 ENCOUNTER — Telehealth: Payer: Self-pay | Admitting: Cardiology

## 2014-09-19 NOTE — Telephone Encounter (Signed)
New Message     Pt calling stating that she needs to speak to [facility] in regards to C-pap machine. She was told by the [place] nurse that Dr. Radford Pax wants to do the automatic machine that goes from 4-20 again and she has some questions. Please call back and advise.

## 2014-09-19 NOTE — Telephone Encounter (Signed)
Per Dr. Radford Pax, patient to complete ONO on her personal PAP.  Patient and [facility] notified.

## 2014-09-26 ENCOUNTER — Encounter: Payer: Self-pay | Admitting: Cardiology

## 2014-09-27 ENCOUNTER — Other Ambulatory Visit: Payer: Self-pay | Admitting: *Deleted

## 2014-09-27 DIAGNOSIS — G4733 Obstructive sleep apnea (adult) (pediatric): Secondary | ICD-10-CM

## 2014-10-03 ENCOUNTER — Encounter: Payer: Self-pay | Admitting: Cardiology

## 2014-10-05 ENCOUNTER — Telehealth: Payer: Self-pay | Admitting: Cardiology

## 2014-10-05 DIAGNOSIS — G4733 Obstructive sleep apnea (adult) (pediatric): Secondary | ICD-10-CM

## 2014-10-05 NOTE — Telephone Encounter (Signed)
Patient requesting copy of her ONO. Informed patient O2 has been discontinued and [facility] has been notified.

## 2014-10-05 NOTE — Telephone Encounter (Signed)
New Message  Pt wanted to speak w/ Rn. Was not specific. Ple ase call back and discuss.

## 2014-10-09 ENCOUNTER — Telehealth: Payer: Self-pay | Admitting: Cardiology

## 2014-10-09 DIAGNOSIS — G4733 Obstructive sleep apnea (adult) (pediatric): Secondary | ICD-10-CM

## 2014-10-09 NOTE — Telephone Encounter (Signed)
New message      Need updated order and clarification on an order sent 10-05-14

## 2014-10-09 NOTE — Telephone Encounter (Signed)
Per Freda Munro, order is to read "Discontinue oxygen therapy."  New order placed.

## 2014-10-09 NOTE — Telephone Encounter (Signed)
Left message to call back  

## 2014-11-15 ENCOUNTER — Other Ambulatory Visit: Payer: Self-pay | Admitting: Internal Medicine

## 2014-12-24 NOTE — Progress Notes (Signed)
Cardiology Office Note   Date:  12/25/2014   ID:  Ann Fernandez, DOB 05/21/1956, MRN [id number]  PCP:  Neale Burly, MD    Chief Complaint  Patient presents with  . Follow-up    OSA      History of Present Illness: Ann Fernandez is a 59 y.o. female who presents for followup  of sleep apnea.  She has a history of mild  OSA with an AHI of 12.4 events per hour. She is on CPAP at 9cm H2O.   She is doing well with her device. She tolerates her full face mask without any problems. She feels rested in the am and has no daytime sleepiness.  She has lost 10 lbs since I saw her last.  She is back to walking after getting a TKR.      Past Medical History  Diagnosis Date  . Allergy   . Hyperlipidemia   . Hypertension   . Diverticulosis of colon   . DDD (degenerative disc disease)   . IBS (irritable bowel syndrome)   . PONV (postoperative nausea and vomiting)   . Anxiety     regarding  upcoming breast surgery  . Wears glasses   . GERD (gastroesophageal reflux disease)   . Mild sleep apnea   . OSA on CPAP 06/27/2014  . Obesity (BMI 30-39.9) 06/27/2014    Past Surgical History  Procedure Laterality Date  . Cervical laminectomy    . Sinus irrigation    . Trigger finger release    . Knee arthroscopy    . Cyst removal trunk      LOWER BACK  . Abdominal hysterectomy  1994    TVH for DUB--ovaries retained    . Knee arthroscopy Right 05/25/2013    Procedure: RIGHT ARTHROSCOPY KNEE, PARTIAL MEDIAL MENISCECTOMY, CHONDROPLASTY, PARTIAL LATERAL MENISCECTOMY;  Surgeon: Gearlean Alf, MD;  Location: [facility];  Service: Orthopedics;  Laterality: Right;  . Back surgery  2011    lumb cyst  . Tonsillectomy    . Wisdom tooth extraction    . Dilation and curettage of uterus    . Partial mastectomy with needle localization Left 06/03/2013    Procedure: PARTIAL MASTECTOMY WITH NEEDLE LOCALIZATION;  Surgeon: [name], MD;  Location: [place];   Service: General;  Laterality: Left;  . Breast surgery       Current Outpatient Prescriptions  Medication Sig Dispense Refill  . acetaminophen (PAIN RELIEVER EXTRA STRENGTH) 500 MG tablet Take by mouth as needed.     . calcium-vitamin D (OSCAL WITH D) 500-200 MG-UNIT per tablet Take 1 tablet by mouth 2 (two) times daily.    . diclofenac (VOLTAREN) 75 MG EC tablet Take 75 mg by mouth daily.     . fexofenadine (ALLEGRA) 180 MG tablet Take 180 mg by mouth at bedtime.     . fish oil-omega-3 fatty acids 1000 MG capsule Take 2 g by mouth daily.      . hydrochlorothiazide (HYDRODIURIL) 25 MG tablet TAKE ONE HALF TABLET DAILY.    . meclizine (ANTIVERT) 25 MG tablet Take 25 mg by mouth as needed.     . metFORMIN (GLUCOPHAGE) 500 MG tablet Take 500 mg by mouth daily.    . methylcellulose (CITRUCEL) oral powder Take 1 packet by mouth at bedtime.     . mometasone (NASONEX) 50 MCG/ACT nasal spray Place 2  sprays into the nose daily.    . nebivolol (BYSTOLIC) 10 MG tablet Take 10 mg by mouth every morning.    Marland Kitchen omeprazole (PRILOSEC) 20 MG capsule Take 20 mg by mouth as needed.     . pravastatin (PRAVACHOL) 40 MG tablet Take 40 mg by mouth at bedtime.     No current facility-administered medications for this visit.    Allergies:   Amlodipine besylate; Darvocet; Demerol; Hydrochlorothiazide w-triamterene; Lipitor; Lisinopril; Meperidine hcl; Penicillins; Simvastatin; and Sulfamethoxazole    Social History:  The patient  reports that she has never smoked. She has never used smokeless tobacco. She reports that she does not drink alcohol or use illicit drugs.   Family History:  The patient's family history includes COPD in her father; Colon cancer in her mother; Deep vein thrombosis in her mother; Heart disease in her brother, father, and mother; Lung disease in her mother; Osteoarthritis in her father; Pulmonary embolism in her brother; Rectal cancer in her mother; Rheum arthritis in her daughter and  father.    ROS:  Please see the history of present illness.   Otherwise, review of systems are positive for none.   All other systems are reviewed and negative.    PHYSICAL EXAM: VS:  BP 150/76 mmHg  Pulse 65  Ht 5\' 5"  (1.651 m)  Wt 209 lb 1.9 oz (94.856 kg)  BMI 34.80 kg/m2  SpO2 95% , BMI Body mass index is 34.8 kg/(m^2). GEN: Well nourished, well developed, in no acute distress HEENT: normal Neck: no JVD, carotid bruits, or masses Cardiac: RRR; no murmurs, rubs, or gallops,no edema  Respiratory:  clear to auscultation bilaterally, normal work of breathing GI: soft, nontender, nondistended, + BS MS: no deformity or atrophy Skin: warm and dry, no rash Neuro:  Strength and sensation are intact Psych: euthymic mood, full affect   EKG:  EKG is not ordered today.    Recent Labs: 06/08/2014: ALT 34; HGB 14.3; Platelets 195 07/12/2014: BUN 13; Creatinine, Ser 0.61; Potassium 3.7; Sodium 140    Lipid Panel    Component Value Date/Time   CHOL 227* 08/15/2013 1053   TRIG 203.0* 08/15/2013 1053   HDL 40.70 08/15/2013 1053   CHOLHDL 6 08/15/2013 1053   VLDL 40.6* 08/15/2013 1053   LDLCALC 146* 08/15/2013 1053   LDLDIRECT 172.8 06/29/2012 1003      Wt Readings from Last 3 Encounters:  12/25/14 209 lb 1.9 oz (94.856 kg)  06/27/14 212 lb 6.4 oz (96.344 kg)  06/08/14 219 lb 1.6 oz (99.383 kg)      ASSESSMENT AND PLAN:  1. Mild OSA on CPAP and tolerating well. She has had improved daytime sleepiness and thinks she is sleeping better at night. She does not snore.  Her d/l today is good with an AHI 1.5/hr.  I have also instructed the patient on proper sleep hygiene, avoidance of sleeping in the supine position and avoidance of alcohol within 4 hours of bedtime.  The patient was also instructed to avoid driving if sleepy.   2. HTN borderline controlled but she says that she has white coat HTN I have asked her to check her BP daily for a week and call with results  3.  Obesity - I have encouraged her to get into a good diet program and continue in her exercise program    Current medicines are reviewed at length with the patient today.  The patient does not have concerns regarding medicines.  The following changes have been  made:  no change  Labs/ tests ordered today: See above Assessment and Plan No orders of the defined types were placed in this encounter.     Disposition:   FU with me in 1 year  Signed, Sueanne Margarita, MD  12/25/2014 3:42 PM    [facility] [place], [place], [place]  [address] Phone: [phone]; Fax: [phone]

## 2014-12-25 ENCOUNTER — Ambulatory Visit (INDEPENDENT_AMBULATORY_CARE_PROVIDER_SITE_OTHER): Payer: BC Managed Care – PPO | Admitting: Cardiology

## 2014-12-25 ENCOUNTER — Encounter: Payer: Self-pay | Admitting: Cardiology

## 2014-12-25 VITALS — BP 150/76 | HR 65 | Ht 65.0 in | Wt 209.1 lb

## 2014-12-25 DIAGNOSIS — G4733 Obstructive sleep apnea (adult) (pediatric): Secondary | ICD-10-CM

## 2014-12-25 DIAGNOSIS — I10 Essential (primary) hypertension: Secondary | ICD-10-CM | POA: Diagnosis not present

## 2014-12-25 DIAGNOSIS — E669 Obesity, unspecified: Secondary | ICD-10-CM

## 2014-12-25 NOTE — Patient Instructions (Signed)

## 2015-01-01 ENCOUNTER — Telehealth: Payer: Self-pay | Admitting: Cardiology

## 2015-01-01 ENCOUNTER — Encounter: Payer: Self-pay | Admitting: Cardiology

## 2015-01-01 NOTE — Telephone Encounter (Signed)
Instructed patient to continue current medications. Patient agrees with treatment plan.

## 2015-01-01 NOTE — Telephone Encounter (Signed)
BP reading to Dr. Radford Pax for review.

## 2015-01-01 NOTE — Telephone Encounter (Signed)
BP and HR stable continue current meds 

## 2015-01-01 NOTE — Telephone Encounter (Signed)
New message      Pt c/o BP issue: STAT if pt c/o blurred vision, one-sided weakness or slurred speech  1. What are your last 5 BP readings? 8-2 136/86 pulse 57; 8-3 145/80 pulse 60; 8-4 132/77 pulse 63; 8-5 126/82 pulse 63; 8-6 149/85 pulse 61; 8-7 153/88 pulse 57; 8-8 128/70 pulse 60  2. Are you having any other symptoms (ex. Dizziness, headache, blurred vision, passed out)? no 3. What is your BP issue? Calling to give bp readings.  Bp taken daily between 12-1pm

## 2015-02-01 ENCOUNTER — Telehealth: Payer: Self-pay | Admitting: Cardiology

## 2015-02-01 DIAGNOSIS — G4733 Obstructive sleep apnea (adult) (pediatric): Secondary | ICD-10-CM

## 2015-02-01 NOTE — Telephone Encounter (Signed)
New message     Pt states that she feels like she is getting to much air from CPAP machine Please call to discuss

## 2015-02-02 NOTE — Telephone Encounter (Signed)
Please order a 2 week autotitration from 4 to 20cm H2O 

## 2015-02-02 NOTE — Addendum Note (Signed)
Addended by: Andres Ege on: 02/02/2015 02:43 PM   Modules accepted: Orders

## 2015-02-02 NOTE — Telephone Encounter (Signed)
Patient stated that she saw Dr. Radford Pax a few weeks ago and she said that she is feeling "bloated" while using her machine and right after taking it off in the morning. She is currently at 9cm H2O, and she is wondering if it's possible to decrease it just a tad to see if that helps.    I have let her know that I will forward this message to Dr. Radford Pax, and that I will call her as soon as I have an answer.

## 2015-02-02 NOTE — Telephone Encounter (Signed)
Order placed.   Patient is aware and is okay to proceed. [facility] Notified

## 2015-02-23 DIAGNOSIS — M7652 Patellar tendinitis, left knee: Secondary | ICD-10-CM | POA: Insufficient documentation

## 2015-03-01 ENCOUNTER — Encounter: Payer: Self-pay | Admitting: Cardiology

## 2015-03-06 ENCOUNTER — Telehealth: Payer: Self-pay | Admitting: *Deleted

## 2015-03-06 DIAGNOSIS — G4733 Obstructive sleep apnea (adult) (pediatric): Secondary | ICD-10-CM

## 2015-03-06 NOTE — Telephone Encounter (Signed)
Patient is aware that Dr. Radford Pax has approved auto cpap.   [facility] has been notified, orders placed

## 2015-03-06 NOTE — Telephone Encounter (Signed)
-----   Message from Sueanne Margarita, MD sent at 03/06/2015 12:56 PM EDT ----- Auto CPAP is fine

## 2015-03-28 ENCOUNTER — Telehealth: Payer: Self-pay | Admitting: Cardiology

## 2015-03-28 NOTE — Telephone Encounter (Signed)
New problem    Pt stated she hasn't heard back from [facility] or [facility] concerning getting her air lower or another machine. Pt was told to call Romelle Starcher back if she hadn't heard from [facility] or [facility].

## 2015-03-29 NOTE — Telephone Encounter (Signed)
[facility] is working on this.  I have left a message for patient to call me back so I can let her know.

## 2015-04-06 NOTE — Telephone Encounter (Signed)
NOTE TO FINISH COMMENT BELOW:  Patient is requesting if we can just go back to the order of decreasing the pressure to 8.

## 2015-04-06 NOTE — Telephone Encounter (Signed)
Dr. Radford Pax approved to lower the pressure to 8cm H2O instead of patient paying for a new machine. Order placed, [facility] notified.  Patient is aware and agrees to this treatment plan.

## 2015-04-06 NOTE — Addendum Note (Signed)
Addended by: Andres Ege on: 04/06/2015 11:45 AM   Modules accepted: Orders

## 2015-04-06 NOTE — Telephone Encounter (Signed)
Patient is not eligible for a new machine to be able to get the auto CPAP, so she is wondering if we can just lower the pressure on what she is on now since it is blowing too hard.  She had an auto titration a few weeks ago, so the

## 2015-04-17 ENCOUNTER — Encounter: Payer: Self-pay | Admitting: Cardiology

## 2015-04-17 ENCOUNTER — Ambulatory Visit (INDEPENDENT_AMBULATORY_CARE_PROVIDER_SITE_OTHER): Payer: BC Managed Care – PPO | Admitting: Cardiology

## 2015-04-17 VITALS — BP 144/90 | HR 64 | Ht 65.0 in | Wt 213.0 lb

## 2015-04-17 DIAGNOSIS — E785 Hyperlipidemia, unspecified: Secondary | ICD-10-CM | POA: Diagnosis not present

## 2015-04-17 DIAGNOSIS — R0609 Other forms of dyspnea: Secondary | ICD-10-CM

## 2015-04-17 DIAGNOSIS — I10 Essential (primary) hypertension: Secondary | ICD-10-CM

## 2015-04-17 DIAGNOSIS — R0600 Dyspnea, unspecified: Secondary | ICD-10-CM

## 2015-04-17 MED ORDER — LOSARTAN POTASSIUM 25 MG PO TABS: 25.0000 mg | ORAL_TABLET | Freq: Every day | ORAL | Status: DC

## 2015-04-17 NOTE — Progress Notes (Signed)
Patient ID: Ann Fernandez, female   DOB: 05-Mar-1956, 59 y.o.   MRN: [id number]                  Cardiology Office Note   Date:  04/17/2015   ID:  Ann Fernandez, DOB 17-Jun-1955, MRN [id number]  PCP:  Neale Burly, MD    No chief complaint on file.     History of Present Illness: Ann Fernandez is a 59 y.o. female that comes for follow up of DO, hypertension and hyperlipidemia. She couldn't tolerate statins as she developed significant muscle pain. Her DOE has improved. She is using CPAP for OSA. Denies CP, palpitations, dizziness, falls or claudications.   Past Medical History  Diagnosis Date  . Allergy   . Hyperlipidemia   . Hypertension   . Diverticulosis of colon   . DDD (degenerative disc disease)   . IBS (irritable bowel syndrome)   . PONV (postoperative nausea and vomiting)   . Anxiety     regarding  upcoming breast surgery  . Wears glasses   . GERD (gastroesophageal reflux disease)   . Mild sleep apnea   . OSA on CPAP 06/27/2014  . Obesity (BMI 30-39.9) 06/27/2014   Past Surgical History  Procedure Laterality Date  . Cervical laminectomy    . Sinus irrigation    . Trigger finger release    . Knee arthroscopy    . Cyst removal trunk      LOWER BACK  . Abdominal hysterectomy  1994    TVH for DUB--ovaries retained    . Knee arthroscopy Right 05/25/2013    Procedure: RIGHT ARTHROSCOPY KNEE, PARTIAL MEDIAL MENISCECTOMY, CHONDROPLASTY, PARTIAL LATERAL MENISCECTOMY;  Surgeon: Gearlean Alf, MD;  Location: [facility];  Service: Orthopedics;  Laterality: Right;  . Back surgery  2011    lumb cyst  . Tonsillectomy    . Wisdom tooth extraction    . Dilation and curettage of uterus    . Partial mastectomy with needle localization Left 06/03/2013    Procedure: PARTIAL MASTECTOMY WITH NEEDLE LOCALIZATION;  Surgeon: [name], MD;  Location: [facility];  Service: General;  Laterality: Left;  . Breast surgery     Current Outpatient Prescriptions    Medication Sig Dispense Refill  . acetaminophen (PAIN RELIEVER EXTRA STRENGTH) 500 MG tablet Take by mouth as needed.     . calcium-vitamin D (OSCAL WITH D) 500-200 MG-UNIT per tablet Take 1 tablet by mouth 2 (two) times daily.    . diclofenac (VOLTAREN) 75 MG EC tablet Take 75 mg by mouth daily.     . fexofenadine (ALLEGRA) 180 MG tablet Take 180 mg by mouth at bedtime.     . fish oil-omega-3 fatty acids 1000 MG capsule Take 2 g by mouth daily.      . hydrochlorothiazide (HYDRODIURIL) 25 MG tablet TAKE ONE HALF TABLET DAILY.    . meclizine (ANTIVERT) 25 MG tablet Take 25 mg by mouth as needed.     . metFORMIN (GLUCOPHAGE) 500 MG tablet Take 500 mg by mouth daily.    . methylcellulose (CITRUCEL) oral powder Take 1 packet by mouth at bedtime.     . mometasone (NASONEX) 50 MCG/ACT nasal spray Place 2 sprays into the nose daily.    . nebivolol (BYSTOLIC) 10 MG tablet Take 10 mg by mouth every morning.    Marland Kitchen omeprazole (PRILOSEC) 20 MG capsule Take 20 mg by mouth as needed.     Marland Kitchen  pravastatin (PRAVACHOL) 40 MG tablet Take 40 mg by mouth at bedtime.     No current facility-administered medications for this visit.   Allergies:   Amlodipine besylate; Darvocet; Demerol; Hydrochlorothiazide w-triamterene; Lipitor; Lisinopril; Meperidine hcl; Penicillins; Propoxyphene; Simvastatin; and Sulfamethoxazole   Social History:  The patient  reports that she has never smoked. She has never used smokeless tobacco. She reports that she does not drink alcohol or use illicit drugs.   Family History:  The patient's family history includes COPD in her father; Colon cancer in her mother; Deep vein thrombosis in her mother; Heart disease in her brother, father, and mother; Lung disease in her mother; Osteoarthritis in her father; Pulmonary embolism in her brother; Rectal cancer in her mother; Rheum arthritis in her daughter and father.   ROS:  Please see the history of present illness.   Otherwise, review of systems  are positive for none.   All other systems are reviewed and negative.   PHYSICAL EXAM: VS:  BP 144/90 mmHg  Pulse 64  Ht 5\' 5"  (1.651 m)  Wt 213 lb (96.616 kg)  BMI 35.44 kg/m2 , BMI Body mass index is 35.44 kg/(m^2). GEN: Well nourished, well developed, in no acute distress HEENT: normal Neck: no JVD, carotid bruits, or masses Cardiac: RRR; no murmurs, rubs, or gallops,no edema  Respiratory:  clear to auscultation bilaterally, normal work of breathing GI: soft, nontender, nondistended, + BS MS: no deformity or atrophy Skin: warm and dry, no rash Neuro:  Strength and sensation are intact Psych: euthymic mood, full affect  EKG:  EKG is not ordered today.  Recent Labs: 06/08/2014: ALT 34; HGB 14.3; Platelets 195 07/12/2014: BUN 13; Creatinine, Ser 0.61; Potassium 3.7; Sodium 140   Lipid Panel    Component Value Date/Time   CHOL 227* 08/15/2013 1053   TRIG 203.0* 08/15/2013 1053   HDL 40.70 08/15/2013 1053   CHOLHDL 6 08/15/2013 1053   VLDL 40.6* 08/15/2013 1053   LDLCALC 146* 08/15/2013 1053   LDLDIRECT 172.8 06/29/2012 1003   Wt Readings from Last 3 Encounters:  04/17/15 213 lb (96.616 kg)  12/25/14 209 lb 1.9 oz (94.856 kg)  06/27/14 212 lb 6.4 oz (96.344 kg)    TTE: 01/2014 - Left ventricle: The cavity size was normal. Wall thickness was increased in a pattern of mild LVH. Systolic function was normal. The estimated ejection fraction was in the range of 60% to 65%. Wall motion was normal; there were no regional wall motion abnormalities. Features are consistent with a pseudonormal left ventricular filling pattern, with concomitant abnormal relaxation and increased filling pressure (grade 2 diastolic dysfunction). - Left atrium: The atrium was mildly dilated. Anterior-posterior dimension: 45 mm. 31.20ml/m2 - Index.    ASSESSMENT AND PLAN:  A very pleasant 59 year old female  1. CP and DOE - abnormal baseline ECG, however normal stress test, no prior  infarct, no ischemia, normal LVEF Her echocardiogram showed normal LVEF grade 2 diastolic dysfunction, but she has no signs of CHF. We will treat aggressively with great hypertension control.  2. HTN - uncontrolled - add losartan 25 mg po daily.   3. Hyperlipidemia - intolerant to multiple statins (atorvastatin, simvastatin, pravastatin), baseline LDL 147, with statin 108 but couldn't tolerate, she has FH of premature CAD, we will refer to the lipid clinic.   Disposition:   FU with me in 6 months.  Signed, Dorothy Spark, MD  04/17/2015 10:10 AM    [id number] [address]  [address], [place], [place]  [address] Phone: [phone]; Fax: [phone]

## 2015-04-17 NOTE — Patient Instructions (Signed)
Medication Instructions:   START TAKING LOSARTAN 25 MG ONCE DAILY     You have been referred to [facility] HERE AT OUR OFFICE     Follow-Up:  Your physician wants you to follow-up in: [place] will receive a reminder letter in the mail two months in advance. If you don't receive a letter, please call our office to schedule the follow-up appointment.     If you need a refill on your cardiac medications before your next appointment, please call your pharmacy.

## 2015-04-18 ENCOUNTER — Ambulatory Visit (INDEPENDENT_AMBULATORY_CARE_PROVIDER_SITE_OTHER): Payer: BC Managed Care – PPO | Admitting: Pharmacist

## 2015-04-18 DIAGNOSIS — E785 Hyperlipidemia, unspecified: Secondary | ICD-10-CM | POA: Diagnosis not present

## 2015-04-18 MED ORDER — ROSUVASTATIN CALCIUM 10 MG PO TABS: ORAL_TABLET | ORAL | Status: DC

## 2015-04-18 NOTE — Progress Notes (Signed)
Patient ID: Ann Fernandez                 DOB: 12/10/1955, Ann yo                         MRN: [id number]     HPI: Ann Fernandez is a very pleasant Ann Fernandez patient referred to lipid clinic by Dr. Meda Coffee. PMH is significant for HTN, HLD, and OSA. She has a strong family history of early cardiac disease as well as intolerances to multiple statins. Pt presents today for further lipid management.  Current Medications: fish oil 2g daily Intolerances: pravastatin 40mg  and 80mg  daily, atorvastatin, simvastatin - all with myalgias in her legs  Risk Factors: strong family history of premature cardiac disease LDL goal: 100mg /dL for primary prevention with risk factors for cardiac disease   Diet: Does not like fried food and does not eat red meat. Eats fish and chicken primarily. Limits the amount of sweets she eats.  Exercise: Leg exercises after knee replacement last year, riding bike 20-30 minutes almost every day, getting back into walking.  Family History: Both parents with first MI at 48, mother also had CABG x4, brother at 14 with CABG x5  Social History: Patient reports that she does not smoke, drink alcohol, or use illicit drugs  Labs: 123456: TC 193, TG 182, HDL 45, LDL 112 (pt thinks she was on pravastatin) 07/2013: TC 227, TG 203, HDL 40.7, LDL 146 (no therapy)  Past Medical History  Diagnosis Date  . Allergy   . Hyperlipidemia   . Hypertension   . Diverticulosis of colon   . DDD (degenerative disc disease)   . IBS (irritable bowel syndrome)   . PONV (postoperative nausea and vomiting)   . Anxiety     regarding  upcoming breast surgery  . Wears glasses   . GERD (gastroesophageal reflux disease)   . Mild sleep apnea   . OSA on CPAP 06/27/2014  . Obesity (BMI 30-39.9) 06/27/2014    Current Outpatient Prescriptions on File Prior to Visit  Medication Sig Dispense Refill  . acetaminophen (PAIN RELIEVER EXTRA STRENGTH) 500 MG tablet Take by mouth as needed.     .  calcium-vitamin D (OSCAL WITH D) 500-200 MG-UNIT per tablet Take 1 tablet by mouth 2 (two) times daily.    . diclofenac (VOLTAREN) 75 MG EC tablet Take 75 mg by mouth daily.     . fexofenadine (ALLEGRA) 180 MG tablet Take 180 mg by mouth at bedtime.     . fish oil-omega-3 fatty acids 1000 MG capsule Take 2 g by mouth daily.      . hydrochlorothiazide (HYDRODIURIL) 25 MG tablet TAKE ONE HALF TABLET DAILY.    Marland Kitchen losartan (COZAAR) 25 MG tablet Take 1 tablet (25 mg total) by mouth daily. 90 tablet 3  . meclizine (ANTIVERT) 25 MG tablet Take 25 mg by mouth as needed.     . metFORMIN (GLUCOPHAGE) 500 MG tablet Take 500 mg by mouth daily.    . methylcellulose (CITRUCEL) oral powder Take 1 packet by mouth at bedtime.     . mometasone (NASONEX) 50 MCG/ACT nasal spray Place 2 sprays into the nose daily.    . nebivolol (BYSTOLIC) 10 MG tablet Take 10 mg by mouth every morning.    Marland Kitchen omeprazole (PRILOSEC) 20 MG capsule Take 20 mg by mouth as needed.      No current facility-administered medications on file prior  to visit.    Allergies  Allergen Reactions  . Amlodipine Besylate     REACTION: edema  . Darvocet [Propoxyphene N-Acetaminophen]     insomnia  . Demerol [Meperidine]     sweating, very hot  . Hydrochlorothiazide W-Triamterene     REACTION: hives  . Lipitor [Atorvastatin] Other (See Comments)    Leg pain   . Lisinopril     REACTION: cough  . Meperidine Hcl     REACTION: flushed sensation---treated urgently with benadryl during medical procedure  . Penicillins Hives    As a child  . Propoxyphene Other (See Comments)    Insomnia; extremely dry eyes  . Simvastatin     REACTION: made legs hurt  . Sulfamethoxazole Diarrhea    With  Bloody stools  X 2    Assessment/Plan:  1. Hyperlipidemia - Patient with strong family history of premature cardiac disease and LDL goal 100mg /dL. Last lipid panel was in May of 2015 and LDL was 112 on pravastatin. Pt is intolerant to pravastatin,  atorvastatin, and simvastatin. Pt not fasting today, will have her come in next Monday for baseline lipid panel (also checking BMET since pt started on losartan yesterday). Once lipid panel is drawn, pt will start Crestor 10mg  TIW. Advised that she can try Coenzyme Q10 200mg  daily if she develops myalgias. Will have patient self titrate based on tolerance. If pt does not tolerate Crestor, will try Zetia next. Rechecking labs 3 months after Crestor initiation.   Megan E. Supple, PharmD [id number], [place], [place] [address] Phone: [phone]; Fax: [phone] 04/18/2015 10:26 AM

## 2015-04-18 NOTE — Patient Instructions (Signed)
Start taking Crestor (rosuvastatin) 10mg  3 times a week You can take coenzyme Q 10, 200mg  daily to help with muscle cramps if they develop If you do well with 3 times a week dosing, try 4 times a week dosing. If you have some aches taking the Crestor 3 times a week, cut back to 2 times a week. Call Deep Bon[place]itz in lipid clinic if you have any questions at [phone] Recheck cholesterol in 3 months. Come in for fasting lipid panel on Friday, February 17. Lab opens at 7:30am, come in any time after

## 2015-04-23 ENCOUNTER — Other Ambulatory Visit (INDEPENDENT_AMBULATORY_CARE_PROVIDER_SITE_OTHER): Payer: BC Managed Care – PPO | Admitting: *Deleted

## 2015-04-23 DIAGNOSIS — E785 Hyperlipidemia, unspecified: Secondary | ICD-10-CM

## 2015-04-23 LAB — HEPATIC FUNCTION PANEL
ALT: 29 U/L (ref 6–29)
AST: 18 U/L (ref 10–35)
Albumin: 4.1 g/dL (ref 3.6–5.1)
Alkaline Phosphatase: 60 U/L (ref 33–130)
Bilirubin, Direct: 0.1 mg/dL (ref <=0.2)
Indirect Bilirubin: 0.8 mg/dL (ref 0.2–1.2)
Total Bilirubin: 0.9 mg/dL (ref 0.2–1.2)
Total Protein: 6.6 g/dL (ref 6.1–8.1)

## 2015-04-23 LAB — BASIC METABOLIC PANEL
BUN: 14 mg/dL (ref 7–25)
CO2: 28 mmol/L (ref 20–31)
Calcium: 9.8 mg/dL (ref 8.6–10.4)
Chloride: 105 mmol/L (ref 98–110)
Creat: 0.49 mg/dL — ABNORMAL LOW (ref 0.50–1.05)
Glucose, Bld: 85 mg/dL (ref 65–99)
Potassium: 4.1 mmol/L (ref 3.5–5.3)
Sodium: 141 mmol/L (ref 135–146)

## 2015-04-23 LAB — LIPID PANEL
Cholesterol: 242 mg/dL — ABNORMAL HIGH (ref 125–200)
HDL: 39 mg/dL — ABNORMAL LOW (ref >=46)
LDL Cholesterol: 155 mg/dL — ABNORMAL HIGH (ref <130)
Total CHOL/HDL Ratio: 6.2 Ratio — ABNORMAL HIGH (ref <=5.0)
Triglycerides: 240 mg/dL — ABNORMAL HIGH (ref <150)
VLDL: 48 mg/dL — ABNORMAL HIGH (ref <30)

## 2015-04-24 ENCOUNTER — Other Ambulatory Visit: Payer: Self-pay | Admitting: Pharmacist

## 2015-04-24 ENCOUNTER — Telehealth: Payer: Self-pay | Admitting: Cardiology

## 2015-04-24 DIAGNOSIS — E785 Hyperlipidemia, unspecified: Secondary | ICD-10-CM

## 2015-04-24 NOTE — Telephone Encounter (Signed)
New message  Pt returned call for lab results

## 2015-04-24 NOTE — Telephone Encounter (Signed)
Left message for the pt to call back.  Noted the pt has been seen in lipid clinic by Fuller [place] on 04/18/15.  Will route this message to [facility] in lipid clinic, for further review of results and follow-up with the pt.

## 2015-04-24 NOTE — Telephone Encounter (Signed)
Called pt back to inform her of her lab results per Dr Meda Coffee.  Pt is currently being seen in lipid clinic by [facility].  Jinny Blossom has not advised on this pts lab results yet.  Informed the pt of this.  Pt states she has not been taking her statins for sometime now, for she cannot tolerate them, for they cause her extreme muscle aches.  Pt states that when she saw Megan last week in lipid clinic, she advised the pt to start Crestor as a trial run, after her labs were complete, and take this by mouth at night.  Pt reports she started taking Crestor last night, so she is unable to tell any side effects at this time.  Pt states that Jinny Blossom also advised her to take 2 fish oil tablets instead of 1.  Pt states she is doing this.  Pt states that Megan did touch base on PCSK9 inhibitors. Informed the pt that I will route this message to inform Dr Meda Coffee of pt seeing lipid clinic already and what med changes were taken into effect at her [place] with [facility].  Informed the pt that she may hear back from myself or Jinny Blossom, based on these lab results, once she has reviewed and advised on this.  Pt verbalized understanding and agrees with this plan.

## 2015-04-26 IMAGING — CR DG CHEST 2V
2 series · 2 of 2 positions shown · non-contrast
Comparison: PA and lateral chest x-ray August 08, 2009.

CLINICAL DATA: Preoperative study for knee arthroscopy. , history
of hypertension

EXAM:
CHEST  2 VIEW

[w chest pa]
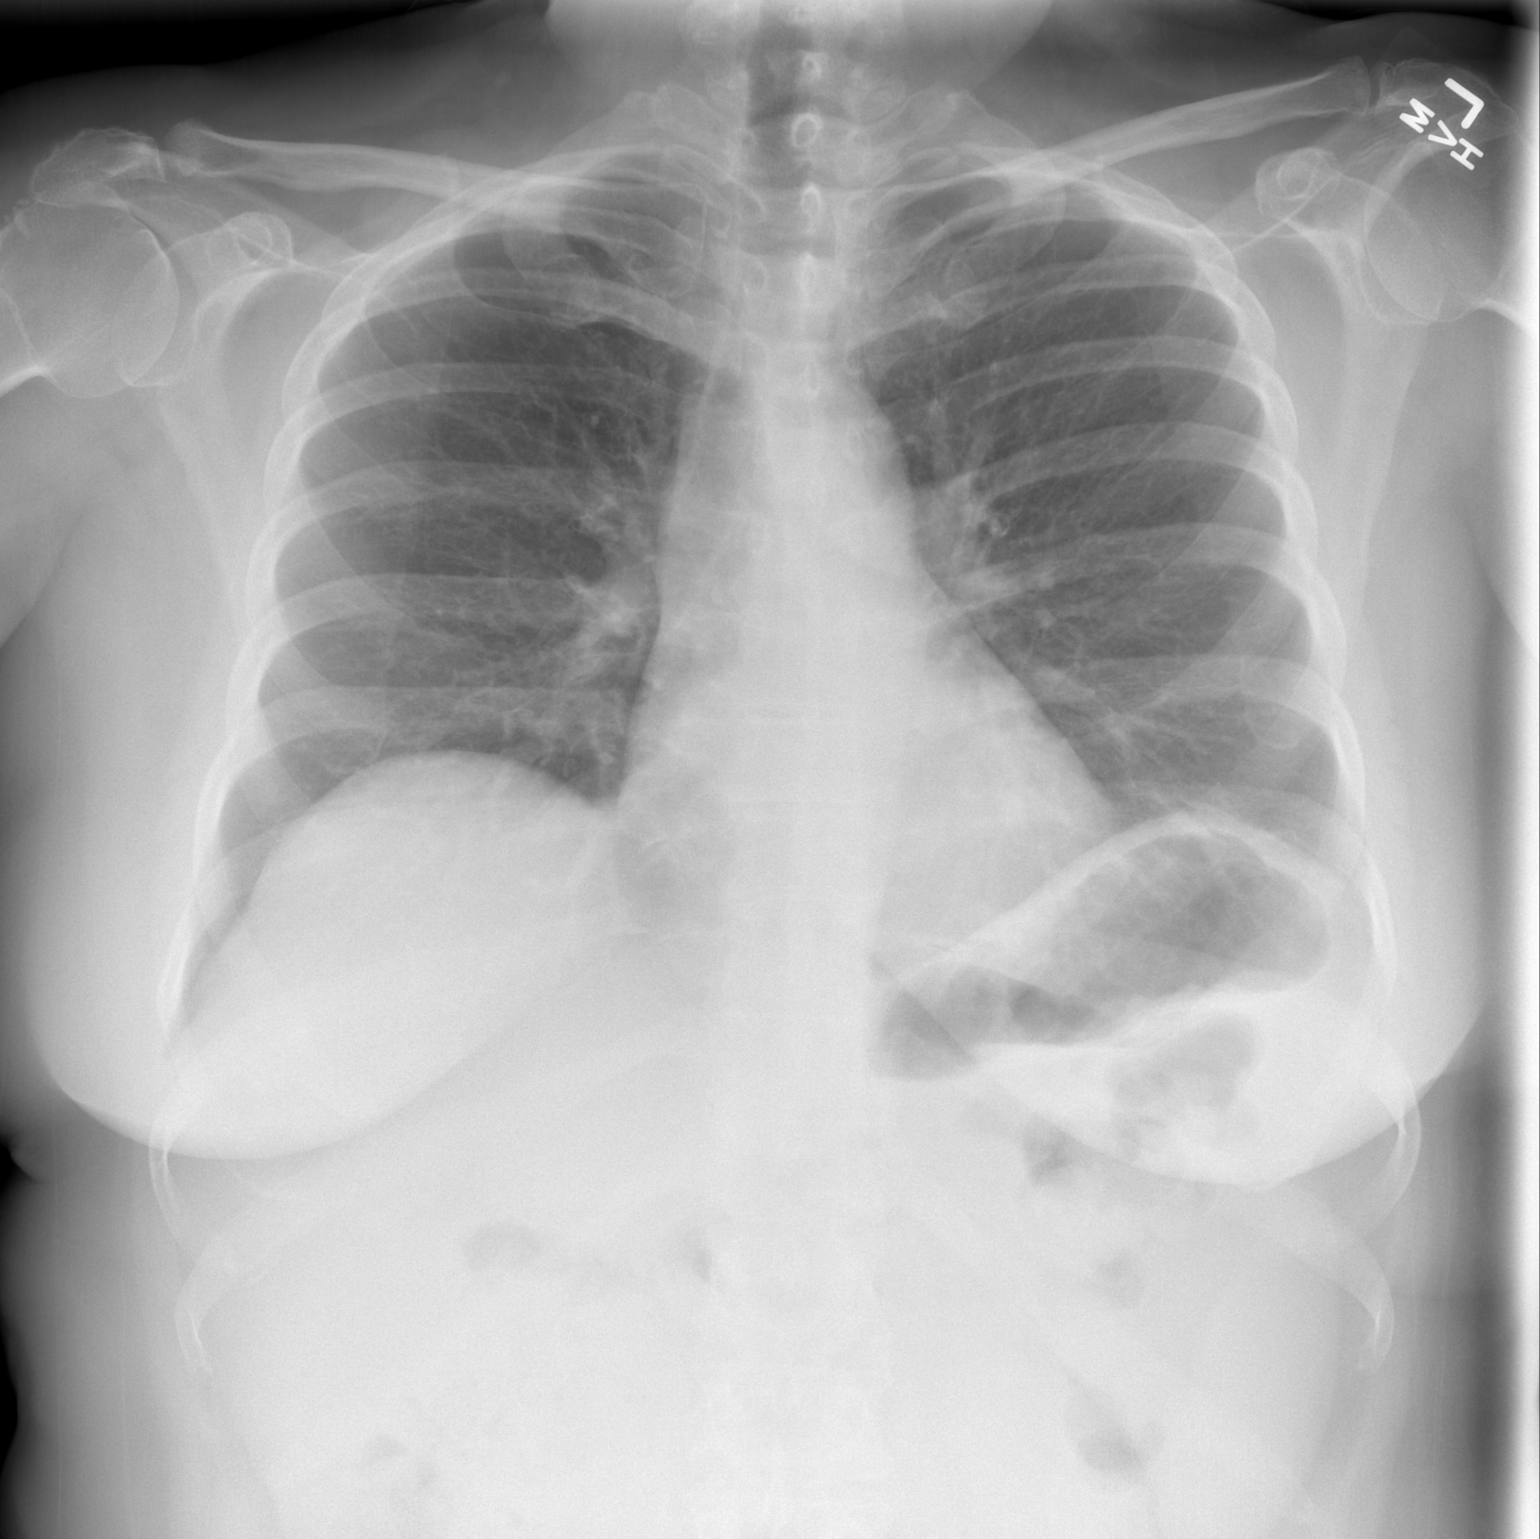

[w chest lat]
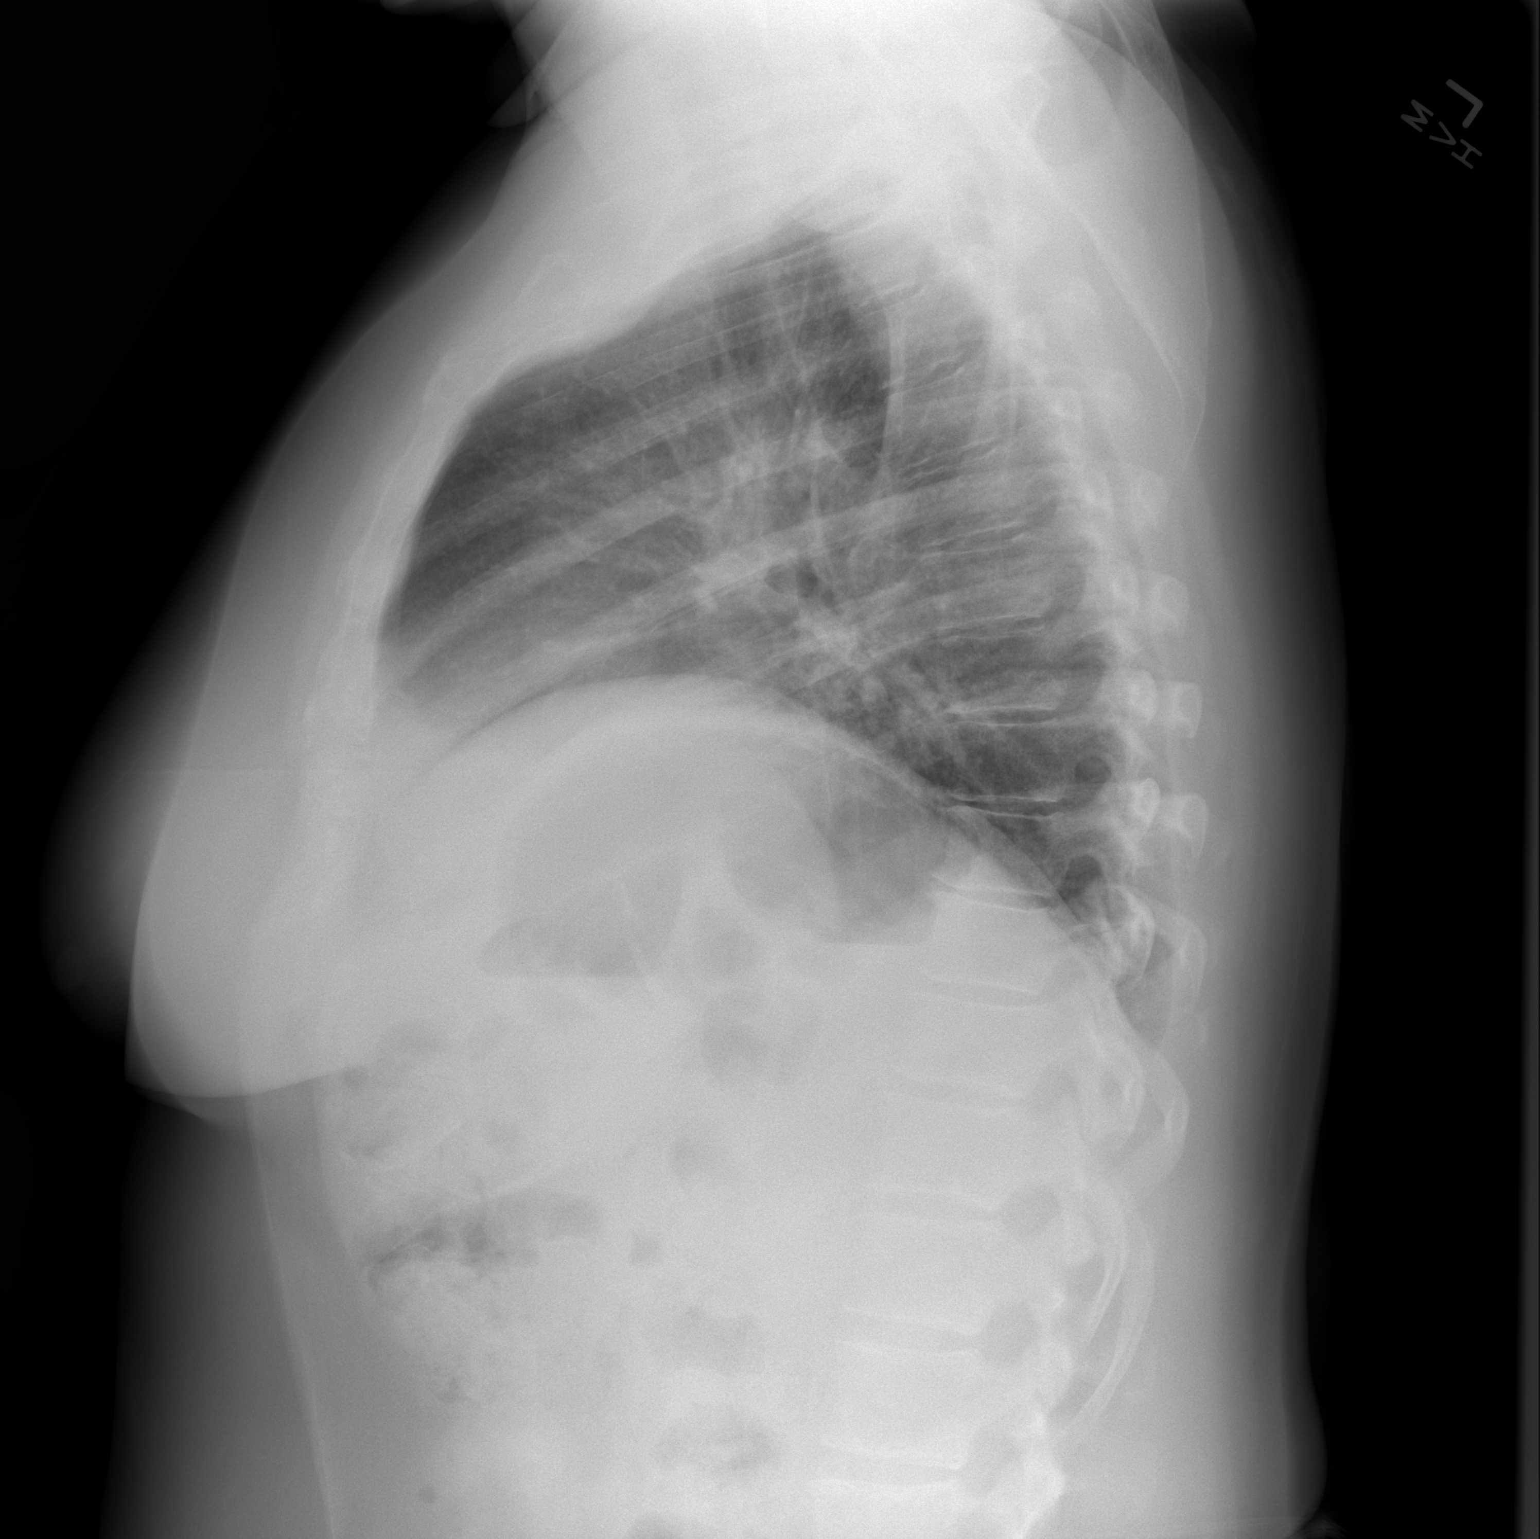

[2 of 2 positions shown; findings below may reference images not displayed]

FINDINGS: The lungs are hypoinflated. There is no focal infiltrate. There are
coarse lung markings just above the dome of the right hemidiaphragm
which may simply reflect vascular crowding but could reflect minimal
atelectasis. The cardiopericardial silhouette is normal in size. The
pulmonary vascularity is not engorged. The mediastinum is normal in
width. There is no pleural effusion or pneumothorax. The observed
portions of the bony thorax appear normal.
IMPRESSION: The study is limited due to bilateral hypo inflation. There is no
evidence of CHF nor pneumonia. I cannot exclude minimal subsegmental
atelectasis just above the dome of the left hemidiaphragm.

## 2015-05-11 ENCOUNTER — Ambulatory Visit (INDEPENDENT_AMBULATORY_CARE_PROVIDER_SITE_OTHER): Payer: BC Managed Care – PPO | Admitting: Obstetrics and Gynecology

## 2015-05-11 ENCOUNTER — Encounter: Payer: Self-pay | Admitting: Obstetrics and Gynecology

## 2015-05-11 VITALS — BP 124/80 | HR 70 | Resp 18 | Ht 65.0 in | Wt 213.0 lb

## 2015-05-11 DIAGNOSIS — Z01419 Encounter for gynecological examination (general) (routine) without abnormal findings: Secondary | ICD-10-CM | POA: Diagnosis not present

## 2015-05-11 DIAGNOSIS — N816 Rectocele: Secondary | ICD-10-CM | POA: Diagnosis not present

## 2015-05-11 NOTE — Progress Notes (Signed)
59 y.o. G53P2002 Married Caucasian female here for annual exam.    Hx of third degree high rectocele noted at last annual exam.  Some constipation. Using Citrocel  No bladder control problems.  Sexually active and worried this will worsen the rectocele. Cares for grandchild who will go to kindergarten next year.  Had a right knee replacement this year. Then injured left knee.  Has done PT. Does heavy yard work.    Did blood work with [facility].  Was dx with mild congestive heart failure while on Arimidex.  Now off Arimidex and Tamoxifen. Has strong FH of heart disease in parents in their late 28s.   PCP:   Dr. Telford Nab  No LMP recorded. Patient has had a hysterectomy.          Sexually active: Yes.    The current method of family planning is status post hysterectomy.    Exercising: Yes.    Hand weights, stretches, walk Smoker:  no  Health Maintenance: Pap:  2009 Negative--No History of abnormal Pap:  no MMG:  05/01/2014 3D Diagnostic Bilateral Density Category B Benign calcifications in Left breast / neg BIRADS 2.  Scheduled for 05/17/15 at [facility]. Colonoscopy:  09/09/12 Normal w/ Dr. Olevia Perches Next due in 08/2017  BMD:   01/05/14   Result  Normal TDaP:  11/2006 Screening Labs:  Hb today: PCP, Urine today: PCP   reports that she has never smoked. She has never used smokeless tobacco. She reports that she does not drink alcohol or use illicit drugs.  Past Medical History  Diagnosis Date  . Allergy   . Hyperlipidemia   . Hypertension   . Diverticulosis of colon   . DDD (degenerative disc disease)   . IBS (irritable bowel syndrome)   . PONV (postoperative nausea and vomiting)   . Anxiety     regarding  upcoming breast surgery  . Wears glasses   . GERD (gastroesophageal reflux disease)   . Mild sleep apnea   . OSA on CPAP 06/27/2014  . Obesity (BMI 30-39.9) 06/27/2014    Past Surgical History  Procedure Laterality Date  . Cervical laminectomy    . Sinus irrigation     . Trigger finger release    . Knee arthroscopy    . Cyst removal trunk      LOWER BACK  . Abdominal hysterectomy  1994    TVH for DUB--ovaries retained    . Knee arthroscopy Right 05/25/2013    Procedure: RIGHT ARTHROSCOPY KNEE, PARTIAL MEDIAL MENISCECTOMY, CHONDROPLASTY, PARTIAL LATERAL MENISCECTOMY;  Surgeon: Gearlean Alf, MD;  Location: [facility];  Service: Orthopedics;  Laterality: Right;  . Back surgery  2011    lumb cyst  . Tonsillectomy    . Wisdom tooth extraction    . Dilation and curettage of uterus    . Partial mastectomy with needle localization Left 06/03/2013    Procedure: PARTIAL MASTECTOMY WITH NEEDLE LOCALIZATION;  Surgeon: [name], MD;  Location: [facility];  Service: General;  Laterality: Left;  . Breast surgery      Current Outpatient Prescriptions  Medication Sig Dispense Refill  . acetaminophen (PAIN RELIEVER EXTRA STRENGTH) 500 MG tablet Take by mouth as needed.     . calcium-vitamin D (OSCAL WITH D) 500-200 MG-UNIT per tablet Take 1 tablet by mouth 2 (two) times daily.    . diclofenac (VOLTAREN) 75 MG EC tablet Take 75 mg by mouth daily.     . fexofenadine (ALLEGRA) 180  MG tablet Take 180 mg by mouth at bedtime.     . fish oil-omega-3 fatty acids 1000 MG capsule Take 4 g by mouth daily.    . hydrochlorothiazide (HYDRODIURIL) 25 MG tablet TAKE ONE HALF TABLET DAILY.    Marland Kitchen losartan (COZAAR) 25 MG tablet Take 1 tablet (25 mg total) by mouth daily. 90 tablet 3  . meclizine (ANTIVERT) 25 MG tablet Take 25 mg by mouth as needed.     . metFORMIN (GLUCOPHAGE) 500 MG tablet Take 500 mg by mouth daily.    . methylcellulose (CITRUCEL) oral powder Take 1 packet by mouth at bedtime.     . mometasone (NASONEX) 50 MCG/ACT nasal spray Place 2 sprays into the nose daily.    . nebivolol (BYSTOLIC) 10 MG tablet Take 10 mg by mouth every morning.    Marland Kitchen omeprazole (PRILOSEC) 20 MG capsule Take 20 mg by mouth as needed.     . rosuvastatin (CRESTOR) 10 MG  tablet Take 1 tablet by mouth 3 times a week. 15 tablet 11   No current facility-administered medications for this visit.    Family History  Problem Relation Age of Onset  . Heart disease Mother   . Deep vein thrombosis Mother   . Colon cancer Mother   . Rectal cancer Mother   . Lung disease Mother   . Heart disease Father   . COPD Father   . Rheum arthritis Father   . Osteoarthritis Father   . Heart disease Brother   . Rheum arthritis Daughter   . Pulmonary embolism Brother     ROS:  Pertinent items are noted in HPI.  Otherwise, a comprehensive ROS was negative.  Exam:   There were no vitals taken for this visit.    General appearance: alert, cooperative and appears stated age Head: Normocephalic, without obvious abnormality, atraumatic Neck: no adenopathy, supple, symmetrical, trachea midline and thyroid normal to inspection and palpation Lungs: clear to auscultation bilaterally Breasts: normal appearance, no masses or tenderness, Inspection negative, No nipple retraction or dimpling, No nipple discharge or bleeding, No axillary or supraclavicular adenopathy, scar of left breast. Heart: regular rate and rhythm Abdomen: soft, non-tender; bowel sounds normal; no masses,  no organomegaly Extremities: extremities normal, atraumatic, no cyanosis or edema Skin: Skin color, texture, turgor normal. No rashes or lesions Lymph nodes: Cervical, supraclavicular, and axillary nodes normal. No abnormal inguinal nodes palpated Neurologic: Grossly normal  Pelvic: External genitalia:  no lesions              Urethra:  normal appearing urethra with no masses, tenderness or lesions              Bartholins and Skenes: normal                 Vagina: normal appearing vagina with normal color and discharge, no lesions              Cervix: absent              Pap taken: No. Bimanual Exam:  Uterus:  uterus absent              Adnexa: normal adnexa and no mass, fullness, tenderness               Rectovaginal: Yes.  .  Confirms.              Anus:  normal sphincter tone, no lesions  Chaperone was present for exam.  Assessment:   Well  woman visit with normal exam. Status post TVH. Ovaries remain.  Rectocele - third degree.  Stable. History of atypical breast ductal hyperplasia.  Status post partial left mastectomy.  Family history of rectal cancer.   Plan: Yearly mammogram recommended after age 39.  Recommended self breast exam.  Pap and HR HPV as above. Discussed Calcium, Vitamin D, regular exercise program including cardiovascular and weight bearing exercise. Labs performed.  No..     Refills given on medications.  No..    Follow up annually and prn.   Additional counseling given regarding rectocele and surgical repair with or without grafts. Risk of recurrence discussed.  Surgical recovery also reviewed. ACOG handouts on prolapse and surgical care of prolapse to patient.  __15_____ minutes face to face time of which over 50% was spent in counseling.     After visit summary provided.

## 2015-06-11 ENCOUNTER — Other Ambulatory Visit: Payer: Self-pay | Admitting: *Deleted

## 2015-06-11 DIAGNOSIS — N6099 Unspecified benign mammary dysplasia of unspecified breast: Secondary | ICD-10-CM

## 2015-06-11 NOTE — Assessment & Plan Note (Signed)
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 Annual breast exams and mammograms 2. I encouraged her to exercise and lose weight and therefore decrease the risk of breast cancer that way. 3. I also discussed the importance of healthy eating and eating more fruits and vegetables and less red meat.  Patient agrees with these recommendations and will see [place] back in a year.

## 2015-06-12 ENCOUNTER — Encounter: Payer: Self-pay | Admitting: Hematology and Oncology

## 2015-06-12 ENCOUNTER — Telehealth: Payer: Self-pay | Admitting: Hematology and Oncology

## 2015-06-12 ENCOUNTER — Other Ambulatory Visit (HOSPITAL_BASED_OUTPATIENT_CLINIC_OR_DEPARTMENT_OTHER): Payer: BC Managed Care – PPO

## 2015-06-12 ENCOUNTER — Ambulatory Visit (HOSPITAL_BASED_OUTPATIENT_CLINIC_OR_DEPARTMENT_OTHER): Payer: BC Managed Care – PPO | Admitting: Hematology and Oncology

## 2015-06-12 VITALS — BP 142/64 | HR 65 | Temp 98.2°F | Resp 18 | Wt 213.0 lb

## 2015-06-12 DIAGNOSIS — N62 Hypertrophy of breast: Secondary | ICD-10-CM | POA: Diagnosis not present

## 2015-06-12 DIAGNOSIS — N6099 Unspecified benign mammary dysplasia of unspecified breast: Secondary | ICD-10-CM

## 2015-06-12 LAB — COMPREHENSIVE METABOLIC PANEL
ALT: 44 U/L (ref 0–55)
ANION GAP: 10 meq/L (ref 3–11)
AST: 26 U/L (ref 5–34)
Albumin: 4.2 g/dL (ref 3.5–5.0)
Alkaline Phosphatase: 55 U/L (ref 40–150)
BILIRUBIN TOTAL: 0.82 mg/dL (ref 0.20–1.20)
BUN: 11.8 mg/dL (ref 7.0–26.0)
CHLORIDE: 105 meq/L (ref 98–109)
CO2: 26 meq/L (ref 22–29)
Calcium: 9.8 mg/dL (ref 8.4–10.4)
Creatinine: 0.8 mg/dL (ref 0.6–1.1)
EGFR: 83 mL/min/{1.73_m2} — AB (ref >90)
Glucose: 88 mg/dl (ref 70–140)
Potassium: 4.9 mEq/L (ref 3.5–5.1)
Sodium: 141 mEq/L (ref 136–145)
TOTAL PROTEIN: 6.9 g/dL (ref 6.4–8.3)

## 2015-06-12 LAB — CBC WITH DIFFERENTIAL/PLATELET
BASO%: 0.7 % (ref 0.0–2.0)
BASOS ABS: 0 10*3/uL (ref 0.0–0.1)
EOS ABS: 0.2 10*3/uL (ref 0.0–0.5)
EOS%: 3.6 % (ref 0.0–7.0)
HCT: 45.5 % (ref 34.8–46.6)
HGB: 15.1 g/dL (ref 11.6–15.9)
LYMPH%: 30 % (ref 14.0–49.7)
MCH: 30.6 pg (ref 25.1–34.0)
MCHC: 33.1 g/dL (ref 31.5–36.0)
MCV: 92.3 fL (ref 79.5–101.0)
MONO#: 0.5 10*3/uL (ref 0.1–0.9)
MONO%: 7.3 % (ref 0.0–14.0)
NEUT#: 3.8 10*3/uL (ref 1.5–6.5)
NEUT%: 58.4 % (ref 38.4–76.8)
PLATELETS: 174 10*3/uL (ref 145–400)
RBC: 4.93 10*6/uL (ref 3.70–5.45)
RDW: 13.9 % (ref 11.2–14.5)
WBC: 6.4 10*3/uL (ref 3.9–10.3)
lymph#: 1.9 10*3/uL (ref 0.9–3.3)

## 2015-06-12 NOTE — Progress Notes (Signed)
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 She discontinued antiestrogen therapy because of severe hot flashes and mood swings. Since she stopped the treatment her symptoms have normalized. She had a recent mammogram in December 2016 which was normal. She denies any lumps or nodules in the breasts.  REVIEW OF SYSTEMS:   Constitutional: Denies fevers, chills or abnormal weight loss Eyes: Denies blurriness of vision Ears, nose, mouth, throat, and face: Denies mucositis or sore throat Respiratory: Denies cough, dyspnea or wheezes Cardiovascular: Denies palpitation, chest discomfort Gastrointestinal:  Denies nausea, heartburn or change in bowel habits Skin: Denies abnormal skin rashes Lymphatics: Denies new lymphadenopathy or easy bruising Neurological:Denies numbness, tingling or new weaknesses Behavioral/Psych: Mood is stable, no new changes  Extremities: No lower extremity edema Breast:  denies any pain or lumps or nodules in either breasts All other systems were reviewed with the patient and are negative.  I have reviewed the past medical history, past surgical history, social history and family history with the patient and they are unchanged from previous note.  ALLERGIES:  is allergic to amlodipine besylate; darvocet; demerol; hydrochlorothiazide w-triamterene; lipitor; lisinopril; meperidine hcl; penicillins; propoxyphene; simvastatin; and sulfamethoxazole.  MEDICATIONS:  Current Outpatient Prescriptions  Medication Sig Dispense Refill  . acetaminophen (PAIN RELIEVER EXTRA STRENGTH) 500 MG  tablet Take by mouth as needed.     . calcium-vitamin D (OSCAL WITH D) 500-200 MG-UNIT per tablet Take 1 tablet by mouth 2 (two) times daily.    . diclofenac (VOLTAREN) 75 MG EC tablet Take 75 mg by mouth daily.     . fexofenadine (ALLEGRA) 180 MG tablet Take 180 mg by mouth at bedtime.     . fish oil-omega-3 fatty acids 1000 MG capsule Take 4 g by mouth daily.    . hydrochlorothiazide (HYDRODIURIL) 25 MG tablet TAKE ONE HALF TABLET DAILY.    Marland Kitchen losartan (COZAAR) 25 MG tablet Take 1 tablet (25 mg total) by mouth daily. 90 tablet 3  . meclizine (ANTIVERT) 25 MG tablet Take 25 mg by mouth as needed.     . metFORMIN (GLUCOPHAGE) 500 MG tablet Take 500 mg by mouth daily.    . methylcellulose (CITRUCEL) oral powder Take 1 packet by mouth at bedtime.     . mometasone (NASONEX) 50 MCG/ACT nasal spray Place 2 sprays into the nose daily.    . nebivolol (BYSTOLIC) 10 MG tablet Take 10 mg by mouth every morning.    Marland Kitchen omeprazole (PRILOSEC) 20 MG capsule Take 20 mg by mouth as needed.     . rosuvastatin (CRESTOR) 10 MG tablet Take 1 tablet by mouth 3 times a week. 15 tablet 11  . triamcinolone (NASACORT) 55 MCG/ACT AERO nasal inhaler 2 Squirts by Nasal route nightly.     No current facility-administered medications for this visit.    PHYSICAL EXAMINATION: ECOG PERFORMANCE STATUS: 1 - Symptomatic but completely ambulatory  Filed Vitals:   06/12/15 0930  BP: 142/64  Pulse: 65  Temp: 98.2 F (36.8 C)  Resp: 18   Filed Weights   06/12/15 0930  Weight: 213 lb (96.616 kg)    GENERAL:alert, no distress and comfortable  SKIN: skin color, texture, turgor are normal, no rashes or significant lesions EYES: normal, Conjunctiva are pink and non-injected, sclera clear OROPHARYNX:no exudate, no erythema and lips, buccal mucosa, and tongue normal  NECK: supple, thyroid normal size, non-tender, without nodularity LYMPH:  no palpable lymphadenopathy in the cervical, axillary or inguinal LUNGS: clear to  auscultation and percussion with normal breathing effort HEART: regular rate & rhythm and no murmurs and no lower extremity edema ABDOMEN:abdomen soft, non-tender and normal bowel sounds MUSCULOSKELETAL:no cyanosis of digits and no clubbing  NEURO: alert & oriented x 3 with fluent speech, no focal motor/sensory deficits EXTREMITIES: No lower extremity edema BREAST: No palpable masses or nodules in either right or left breasts. No palpable axillary supraclavicular or infraclavicular adenopathy no breast tenderness or nipple discharge. (exam performed in the presence of a chaperone)  LABORATORY DATA:  I have reviewed the data as listed   Chemistry      Component Value Date/Time   NA 141 04/23/2015 1018   NA 142 06/08/2014 1015   K 4.1 04/23/2015 1018   K 4.4 06/08/2014 1015   CL 105 04/23/2015 1018   CO2 28 04/23/2015 1018   CO2 27 06/08/2014 1015   BUN 14 04/23/2015 1018   BUN 14.8 06/08/2014 1015   CREATININE 0.49* 04/23/2015 1018   CREATININE 0.61 07/12/2014 0956   CREATININE 0.8 06/08/2014 1015      Component Value Date/Time   CALCIUM 9.8 04/23/2015 1018   CALCIUM 9.6 06/08/2014 1015   ALKPHOS 60 04/23/2015 1018   ALKPHOS 66 06/08/2014 1015   AST 18 04/23/2015 1018   AST 20 06/08/2014 1015   ALT 29 04/23/2015 1018   ALT 34 06/08/2014 1015   BILITOT 0.9 04/23/2015 1018   BILITOT 0.83 06/08/2014 1015       Lab Results  Component Value Date   WBC 6.4 06/12/2015   HGB 15.1 06/12/2015   HCT 45.5 06/12/2015   MCV 92.3 06/12/2015   PLT 174 06/12/2015   NEUTROABS 3.8 06/12/2015     ASSESSMENT & PLAN:  Atypical ductal hyperplasia, breast Left breast atypical ductal hyperplasia treated with lumpectomy but could not tolerate tamoxifen that was started in July 2015, could not tolerate Arimidex finally being discontinued after reducing the frequency of Arimidex to 4 days a week and could not tolerate that dose either and it was discontinued as of  06/08/2014  Recommendation: 1. Breast exam 06/12/2015: No palpable lumps or nodules 2. Mammogram 05/17/2015: No evidence of malignancy breast density category B 3. I encouraged her to exercise and lose weight and therefore decrease the risk of breast cancer that way. Since she had partial knee replacement she's doing much better 4. I also discussed the importance of healthy eating and eating more fruits and vegetables and less red meat.  Patient agrees with these recommendations and will see [place] back in a year.   No orders of the defined types were placed in this encounter.   The patient has a good understanding of the overall plan. she agrees with it. she will call with any problems that may develop before the next visit here.   Rulon Eisenmenger, MD 06/12/2015

## 2015-06-12 NOTE — Telephone Encounter (Signed)
Appointments made avs printed for pateint

## 2015-06-13 ENCOUNTER — Telehealth: Payer: Self-pay | Admitting: *Deleted

## 2015-06-13 NOTE — Telephone Encounter (Signed)
Received mammo report from [facility], reviewed by Dr. Lindi Adie, sent to scan.

## 2015-06-20 ENCOUNTER — Encounter: Payer: Self-pay | Admitting: Cardiology

## 2015-06-28 ENCOUNTER — Telehealth: Payer: Self-pay | Admitting: Pharmacist

## 2015-06-28 NOTE — Telephone Encounter (Signed)
Lipid panel faxed from PCP - labs drawn at [facility] on 06/20/15.  TC 196, TG 258, HDL 42, LDL 102.  A1c 5.9  See below for plan. Of note, TG also elevated, however pt already on 4g of fish oil and would avoid fenofibrate at this time since pt already having myalgias with low dose Crestor TIW. Will reassess in 1 month when pt calls - will need to reinforce healthy dietary habits.

## 2015-06-28 NOTE — Telephone Encounter (Signed)
Pt called to ask for fax number since she had lipids checked at her PCP office. She reports that her LDL was 102, very close to goal <100 for primary prevention with risk factors for disease (strong family history). Pt is tolerating Crestor 10mg  TIW ok but does report some myalgias. She picked up her refill of Crestor the other day. Will plan to take the rest of her Crestor and call if the myalgias get worse. If they do, will try patient on Zetia 10mg  daily.

## 2015-07-13 ENCOUNTER — Other Ambulatory Visit: Payer: BC Managed Care – PPO

## 2015-07-30 ENCOUNTER — Other Ambulatory Visit: Payer: Self-pay | Admitting: *Deleted

## 2015-07-30 MED ORDER — ROSUVASTATIN CALCIUM 10 MG PO TABS: ORAL_TABLET | ORAL | Status: DC

## 2015-10-12 ENCOUNTER — Encounter: Payer: Self-pay | Admitting: Cardiology

## 2015-10-12 ENCOUNTER — Ambulatory Visit (INDEPENDENT_AMBULATORY_CARE_PROVIDER_SITE_OTHER): Payer: BC Managed Care – PPO | Admitting: Cardiology

## 2015-10-12 VITALS — BP 130/74 | HR 58 | Ht 65.0 in | Wt 211.0 lb

## 2015-10-12 DIAGNOSIS — R0609 Other forms of dyspnea: Secondary | ICD-10-CM | POA: Diagnosis not present

## 2015-10-12 DIAGNOSIS — G4733 Obstructive sleep apnea (adult) (pediatric): Secondary | ICD-10-CM | POA: Diagnosis not present

## 2015-10-12 DIAGNOSIS — I10 Essential (primary) hypertension: Secondary | ICD-10-CM

## 2015-10-12 DIAGNOSIS — E669 Obesity, unspecified: Secondary | ICD-10-CM

## 2015-10-12 DIAGNOSIS — E785 Hyperlipidemia, unspecified: Secondary | ICD-10-CM

## 2015-10-12 DIAGNOSIS — Z9989 Dependence on other enabling machines and devices: Secondary | ICD-10-CM

## 2015-10-12 DIAGNOSIS — R0600 Dyspnea, unspecified: Secondary | ICD-10-CM

## 2015-10-12 LAB — LIPID PANEL
Cholesterol: 180 mg/dL (ref 125–200)
HDL: 41 mg/dL — ABNORMAL LOW (ref >=46)
LDL Cholesterol: 95 mg/dL (ref <130)
Total CHOL/HDL Ratio: 4.4 Ratio (ref <=5.0)
Triglycerides: 222 mg/dL — ABNORMAL HIGH (ref <150)
VLDL: 44 mg/dL — ABNORMAL HIGH (ref <30)

## 2015-10-12 LAB — COMPREHENSIVE METABOLIC PANEL
ALT: 32 U/L — ABNORMAL HIGH (ref 6–29)
AST: 20 U/L (ref 10–35)
Albumin: 4.4 g/dL (ref 3.6–5.1)
Alkaline Phosphatase: 52 U/L (ref 33–130)
BUN: 14 mg/dL (ref 7–25)
CO2: 27 mmol/L (ref 20–31)
Calcium: 9.5 mg/dL (ref 8.6–10.4)
Chloride: 103 mmol/L (ref 98–110)
Creat: 0.64 mg/dL (ref 0.50–1.05)
Glucose, Bld: 84 mg/dL (ref 65–99)
Potassium: 4 mmol/L (ref 3.5–5.3)
Sodium: 141 mmol/L (ref 135–146)
Total Bilirubin: 1 mg/dL (ref 0.2–1.2)
Total Protein: 6.4 g/dL (ref 6.1–8.1)

## 2015-10-12 MED ORDER — ROSUVASTATIN CALCIUM 10 MG PO TABS: ORAL_TABLET | ORAL | Status: DC

## 2015-10-12 NOTE — Patient Instructions (Signed)
Medication Instructions:   Your physician recommends that you continue on your current medications as directed. Please refer to the Current Medication list given to you today.   Labwork:  TODAY---CMET AND LIPIDS    Follow-Up:  Your physician wants you to follow-up in: ONE YEAR WITH DR NELSON You will receive a reminder letter in the mail two months in advance. If you don't receive a letter, please call our office to schedule the follow-up appointment.     If you need a refill on your cardiac medications before your next appointment, please call your pharmacy.   

## 2015-10-12 NOTE — Progress Notes (Signed)
Patient ID: Ann Fernandez, female   DOB: 06-16-1955, 60 y.o.   MRN: [id number]                  Cardiology Office Note   Date:  10/12/2015   ID:  Ann Fernandez, DOB [birth year], MRN [id number]  PCP:  Neale Burly, MD    No chief complaint on file.     History of Present Illness: Ann Fernandez is a 60 y.o. female that comes for follow up of DO, hypertension and hyperlipidemia. She couldn't tolerate statins as she developed significant muscle pain. Her DOE has improved. She is using CPAP for OSA. Denies CP, palpitations, dizziness, falls or claudications.   10/12/2015 the patient is coming after 6 months, she feels very well her blood pressure is finally under control she denies any chest pain or shortness of breath has improved. She is now taking Kristen 10 mg 2-3 times a week if she takes it more on she feels some muscle pain. She can tolerate this. She underwent right knee replacement at [facility] by Dr. Bonnita Hollow and she is very pleased with the results. She is planning to have left knee replacement in about 3 months from now. She denies any palpitations or syncope no lower extremity edema or claudications.  Past Medical History  Diagnosis Date  . Allergy   . Hyperlipidemia   . Hypertension   . Diverticulosis of colon   . DDD (degenerative disc disease)   . IBS (irritable bowel syndrome)   . PONV (postoperative nausea and vomiting)   . Anxiety     regarding  upcoming breast surgery  . Wears glasses   . GERD (gastroesophageal reflux disease)   . Mild sleep apnea   . OSA on CPAP 06/27/2014  . Obesity (BMI 30-39.9) 06/27/2014  . Hearing loss    Past Surgical History  Procedure Laterality Date  . Cervical laminectomy    . Sinus irrigation    . Trigger finger release    . Knee arthroscopy    . Cyst removal trunk      LOWER BACK  . Abdominal hysterectomy  1994    TVH for DUB--ovaries retained    . Knee arthroscopy Right 05/25/2013    Procedure: RIGHT ARTHROSCOPY KNEE,  PARTIAL MEDIAL MENISCECTOMY, CHONDROPLASTY, PARTIAL LATERAL MENISCECTOMY;  Surgeon: Gearlean Alf, MD;  Location: [facility];  Service: Orthopedics;  Laterality: Right;  . Back surgery  2011    lumb cyst  . Tonsillectomy    . Wisdom tooth extraction    . Dilation and curettage of uterus    . Partial mastectomy with needle localization Left 06/03/2013    Procedure: PARTIAL MASTECTOMY WITH NEEDLE LOCALIZATION;  Surgeon: [name], MD;  Location: [facility];  Service: General;  Laterality: Left;  . Breast surgery     Current Outpatient Prescriptions  Medication Sig Dispense Refill  . acetaminophen (PAIN RELIEVER EXTRA STRENGTH) 500 MG tablet Take by mouth as needed.     . calcium-vitamin D (OSCAL WITH D) 500-200 MG-UNIT per tablet Take 1 tablet by mouth 2 (two) times daily.    . diclofenac (VOLTAREN) 75 MG EC tablet Take 75 mg by mouth daily.     . fexofenadine (ALLEGRA) 180 MG tablet Take 180 mg by mouth at bedtime.     . fish oil-omega-3 fatty acids 1000 MG capsule Take 4 g by mouth daily.    . hydrochlorothiazide (HYDRODIURIL) 25 MG tablet TAKE ONE HALF  TABLET DAILY.    Marland Kitchen losartan (COZAAR) 25 MG tablet Take 1 tablet (25 mg total) by mouth daily. 90 tablet 3  . meclizine (ANTIVERT) 25 MG tablet Take 25 mg by mouth as needed.     . metFORMIN (GLUCOPHAGE) 500 MG tablet Take 500 mg by mouth daily.    . methylcellulose (CITRUCEL) oral powder Take 1 packet by mouth at bedtime.     . mometasone (NASONEX) 50 MCG/ACT nasal spray Place 2 sprays into the nose daily.    . nebivolol (BYSTOLIC) 10 MG tablet Take 10 mg by mouth every morning.    Marland Kitchen omeprazole (PRILOSEC) 20 MG capsule Take 20 mg by mouth as needed.     . rosuvastatin (CRESTOR) 10 MG tablet Take 1 tablet by mouth 3 times a week. 15 tablet 11  . triamcinolone (NASACORT) 55 MCG/ACT AERO nasal inhaler 2 Squirts by Nasal route nightly.     No current facility-administered medications for this visit.   Allergies:    Amlodipine besylate; Darvocet; Demerol; Hydrochlorothiazide w-triamterene; Lipitor; Lisinopril; Meperidine hcl; Penicillins; Propoxyphene; Simvastatin; and Sulfamethoxazole   Social History:  The patient  reports that she has never smoked. She has never used smokeless tobacco. She reports that she does not drink alcohol or use illicit drugs.   Family History:  The patient's family history includes COPD in her father; Colon cancer in her mother; Deep vein thrombosis in her mother; Heart disease in her brother, father, and mother; Lung disease in her mother; Osteoarthritis in her father; Pulmonary embolism in her brother; Rectal cancer in her mother; Rheum arthritis in her daughter and father.   ROS:  Please see the history of present illness.   Otherwise, review of systems are positive for none.   All other systems are reviewed and negative.   PHYSICAL EXAM: VS:  BP 130/74 mmHg  Pulse 58  Ht 5\' 5"  (1.651 m)  Wt 211 lb (95.709 kg)  BMI 35.11 kg/m2 , BMI Body mass index is 35.11 kg/(m^2). GEN: Well nourished, well developed, in no acute distress HEENT: normal Neck: no JVD, carotid bruits, or masses Cardiac: RRR; no murmurs, rubs, or gallops,no edema  Respiratory:  clear to auscultation bilaterally, normal work of breathing GI: soft, nontender, nondistended, + BS MS: no deformity or atrophy Skin: warm and dry, no rash Neuro:  Strength and sensation are intact Psych: euthymic mood, full affect  EKG:  EKG is not ordered today.  Recent Labs: 06/12/2015: ALT 44; BUN 11.8; Creatinine 0.8; HGB 15.1; Platelets 174; Potassium 4.9; Sodium 141   Lipid Panel    Component Value Date/Time   CHOL 242* 04/23/2015 1018   TRIG 240* 04/23/2015 1018   HDL 39* 04/23/2015 1018   CHOLHDL 6.2* 04/23/2015 1018   VLDL 48* 04/23/2015 1018   LDLCALC 155* 04/23/2015 1018   LDLDIRECT 172.8 06/29/2012 1003   Wt Readings from Last 3 Encounters:  10/12/15 211 lb (95.709 kg)  06/12/15 213 lb (96.616 kg)    05/11/15 213 lb (96.616 kg)    TTE: 01/2014 - Left ventricle: The cavity size was normal. Wall thickness was increased in a pattern of mild LVH. Systolic function was normal. The estimated ejection fraction was in the range of 60% to 65%. Wall motion was normal; there were no regional wall motion abnormalities. Features are consistent with a pseudonormal left ventricular filling pattern, with concomitant abnormal relaxation and increased filling pressure (grade 2 diastolic dysfunction). - Left atrium: The atrium was mildly dilated. Anterior-posterior dimension: 45 mm.  31.16ml/m2 - Index.    ASSESSMENT AND PLAN:  A very pleasant 60 year old female  1. CP and DOE - abnormal baseline ECG, however normal stress test, no prior infarct, no ischemia, normal LVEF, Her EKG today is unchanged from prior. Her echocardiogram showed normal LVEF grade 2 diastolic dysfunction, but she has no signs of CHF. We will treat aggressively with great hypertension control.  2. HTN - finally controlled with improvement of symptoms..   3. Hyperlipidemia - intolerant to multiple statins (atorvastatin, simvastatin, pravastatin), baseline LDL 147,  FH of premature CAD, she was referred to lipid clinic and started on Crestor 10 mg to be taken 3 times a day, she takes twice a day, we'll recheck lipids and CMP today.  4. Preop evaluation for knee replacement, there is no contraindication from cardiac standpoint.  Disposition:   FU with me in 1 year.  Signed, Ena Dawley, MD  10/12/2015 10:51 AM    [address], [place], [place]  [address] Phone: [phone]; Fax: [phone]

## 2015-10-15 ENCOUNTER — Telehealth: Payer: Self-pay | Admitting: Cardiology

## 2015-10-15 NOTE — Telephone Encounter (Signed)
Notes Recorded by Nuala Alpha, LPN on 075-GRM at [id number] PM Notified the pt that per Dr Meda Coffee, her CMET was normal, she had significantly improved LDL, and her triglycerides are still elevated.  Advised the pt that tighter BS control can help in reduction of her triglycerides.  Pt verbalized understanding and agrees with this plan.

## 2015-10-15 NOTE — Telephone Encounter (Signed)
New Message  Pt called request a cal back about the lab results.

## 2015-11-29 DIAGNOSIS — G8929 Other chronic pain: Secondary | ICD-10-CM | POA: Insufficient documentation

## 2015-11-29 DIAGNOSIS — M5440 Lumbago with sciatica, unspecified side: Secondary | ICD-10-CM | POA: Insufficient documentation

## 2015-12-14 DIAGNOSIS — M5416 Radiculopathy, lumbar region: Secondary | ICD-10-CM | POA: Insufficient documentation

## 2016-01-01 ENCOUNTER — Ambulatory Visit (INDEPENDENT_AMBULATORY_CARE_PROVIDER_SITE_OTHER): Payer: BC Managed Care – PPO | Admitting: Cardiology

## 2016-01-01 ENCOUNTER — Encounter: Payer: Self-pay | Admitting: Cardiology

## 2016-01-01 VITALS — BP 138/62 | HR 77 | Ht 65.0 in | Wt 214.4 lb

## 2016-01-01 DIAGNOSIS — R011 Cardiac murmur, unspecified: Secondary | ICD-10-CM

## 2016-01-01 DIAGNOSIS — G4733 Obstructive sleep apnea (adult) (pediatric): Secondary | ICD-10-CM

## 2016-01-01 DIAGNOSIS — I10 Essential (primary) hypertension: Secondary | ICD-10-CM | POA: Diagnosis not present

## 2016-01-01 DIAGNOSIS — E669 Obesity, unspecified: Secondary | ICD-10-CM

## 2016-01-01 HISTORY — DX: Cardiac murmur, unspecified: R01.1

## 2016-01-01 NOTE — Patient Instructions (Signed)

## 2016-01-01 NOTE — Progress Notes (Signed)
Cardiology Office Note    Date:  01/01/2016   ID:  Ann Fernandez, DOB Apr 01, 1956, MRN [id number]  PCP:  Neale Burly, MD  Cardiologist:  Fransico Him, MD   Chief Complaint  Patient presents with  . Sleep Apnea  . Hypertension    History of Present Illness:  Ann Fernandez is a 60 y.o. female who presents for followup  of sleep apnea.  She has a history of mild  OSA with an AHI of 12.4 events per hour. She is on CPAP at 9cm H2O.   She is doing well with her device. She tolerates her full face mask without any problems. She feels rested in the am and has no daytime sleepiness. She has gained some weight recently but has not been exercising as much due to chronic back pain.     Past Medical History:  Diagnosis Date  . Allergy   . Anxiety    regarding  upcoming breast surgery  . DDD (degenerative disc disease)   . Diverticulosis of colon   . GERD (gastroesophageal reflux disease)   . Hearing loss   . Hyperlipidemia   . Hypertension   . IBS (irritable bowel syndrome)   . Mild sleep apnea   . Obesity (BMI 30-39.9) 06/27/2014  . OSA on CPAP 06/27/2014  . PONV (postoperative nausea and vomiting)   . Wears glasses     Past Surgical History:  Procedure Laterality Date  . ABDOMINAL HYSTERECTOMY  1994   TVH for DUB--ovaries retained    . BACK SURGERY  2011   lumb cyst  . BREAST SURGERY    . CERVICAL LAMINECTOMY    . CYST REMOVAL TRUNK     LOWER BACK  . DILATION AND CURETTAGE OF UTERUS    . KNEE ARTHROSCOPY    . KNEE ARTHROSCOPY Right 05/25/2013   Procedure: RIGHT ARTHROSCOPY KNEE, PARTIAL MEDIAL MENISCECTOMY, CHONDROPLASTY, PARTIAL LATERAL MENISCECTOMY;  Surgeon: Gearlean Alf, MD;  Location: [facility];  Service: Orthopedics;  Laterality: Right;  . PARTIAL MASTECTOMY WITH NEEDLE LOCALIZATION Left 06/03/2013   Procedure: PARTIAL MASTECTOMY WITH NEEDLE LOCALIZATION;  Surgeon: [name], MD;  Location: [facility];  Service: General;  Laterality: Left;    . SINUS IRRIGATION    . TONSILLECTOMY    . TRIGGER FINGER RELEASE    . WISDOM TOOTH EXTRACTION      Current Medications: Outpatient Medications Prior to Visit  Medication Sig Dispense Refill  . acetaminophen (PAIN RELIEVER EXTRA STRENGTH) 500 MG tablet Take by mouth as needed.     . calcium-vitamin D (OSCAL WITH D) 500-200 MG-UNIT per tablet Take 1 tablet by mouth 2 (two) times daily.    . diclofenac (VOLTAREN) 75 MG EC tablet Take 75 mg by mouth daily.     . fexofenadine (ALLEGRA) 180 MG tablet Take 180 mg by mouth at bedtime.     . fish oil-omega-3 fatty acids 1000 MG capsule Take 4 g by mouth daily.    . hydrochlorothiazide (HYDRODIURIL) 25 MG tablet TAKE ONE HALF TABLET DAILY.    Marland Kitchen losartan (COZAAR) 25 MG tablet Take 1 tablet (25 mg total) by mouth daily. 90 tablet 3  . meclizine (ANTIVERT) 25 MG tablet Take 25 mg by mouth as needed.     . metFORMIN (GLUCOPHAGE) 500 MG tablet Take 500 mg by mouth daily.    . methylcellulose (CITRUCEL) oral powder Take 1 packet by mouth as needed.     . nebivolol (BYSTOLIC)  10 MG tablet Take 10 mg by mouth every morning.    Marland Kitchen omeprazole (PRILOSEC) 20 MG capsule Take 20 mg by mouth as needed.     . rosuvastatin (CRESTOR) 10 MG tablet Take 1 tablet by mouth 3 times a week. 15 tablet 11  . triamcinolone (NASACORT) 55 MCG/ACT AERO nasal inhaler 2 Squirts by Nasal route nightly.    . mometasone (NASONEX) 50 MCG/ACT nasal spray Place 2 sprays into the nose daily.     No facility-administered medications prior to visit.      Allergies:   Demerol [meperidine]; Penicillins; Sulfa antibiotics; Amlodipine; Amlodipine besylate; Amlodipine besylate; Darvocet [propoxyphene n-acetaminophen]; Hydrochlorothiazide w-triamterene; Lipitor [atorvastatin]; Lisinopril; Meperidine hcl; Propoxyphene; Simvastatin; and Sulfamethoxazole   Social History   Social History  . Marital status: Married    Spouse name: N/A  . Number of children: N/A  . Years of education: N/A    Social History Main Topics  . Smoking status: Never Smoker  . Smokeless tobacco: Never Used  . Alcohol use No  . Drug use: No  . Sexual activity: Yes    Partners: Male     Comment: TVH   Other Topics Concern  . None   Social History Narrative  . None     Family History:  The patient's family history includes COPD in her father; Colon cancer in her mother; Deep vein thrombosis in her mother; Heart disease in her brother, father, and mother; Lung disease in her mother; Osteoarthritis in her father; Pulmonary embolism in her brother; Rectal cancer in her mother; Rheum arthritis in her daughter and father.   ROS:   Please see the history of present illness.    ROS All other systems reviewed and are negative.   PHYSICAL EXAM:   VS:  BP 138/62   Pulse 77   Ht 5\' 5"  (1.651 m)   Wt 214 lb 6.4 oz (97.3 kg)   BMI 35.68 kg/m    GEN: Well nourished, well developed, in no acute distress  HEENT: normal  Neck: no JVD, carotid bruits, or masses Cardiac: RRR; no rubs, or gallops,no edema.  Intact distal pulses bilaterally. 2/6 SM at LUSB Respiratory:  clear to auscultation bilaterally, normal work of breathing GI: soft, nontender, nondistended, + BS MS: no deformity or atrophy  Skin: warm and dry, no rash Neuro:  Alert and Oriented x 3, Strength and sensation are intact Psych: euthymic mood, full affect  Wt Readings from Last 3 Encounters:  01/01/16 214 lb 6.4 oz (97.3 kg)  10/12/15 211 lb (95.7 kg)  06/12/15 213 lb (96.6 kg)      Studies/Labs Reviewed:   EKG:  EKG is not ordered today.    Recent Labs: 06/12/2015: HGB 15.1; Platelets 174 10/12/2015: ALT 32; BUN 14; Creat 0.64; Potassium 4.0; Sodium 141   Lipid Panel    Component Value Date/Time   CHOL 180 10/12/2015 1114   TRIG 222 (H) 10/12/2015 1114   HDL 41 (L) 10/12/2015 1114   CHOLHDL 4.4 10/12/2015 1114   VLDL 44 (H) 10/12/2015 1114   LDLCALC 95 10/12/2015 1114   LDLDIRECT 172.8 06/29/2012 1003    Additional  studies/ records that were reviewed today include:  CPAP download    ASSESSMENT:    1. OSA (obstructive sleep apnea)   2. Obesity (BMI 30-39.9)   3. Essential hypertension   4. Heart murmur      PLAN:  In order of problems listed above:  OSA - the patient is tolerating PAP  therapy well without any problems. The PAP download was reviewed today and showed an AHI of 1.1/hr on 8 cm H2O with 100% compliance in using more than 4 hours nightly.  The patient has been using and benefiting from CPAP use and will continue to benefit from therapy.  HTN - BP controlled on current meds.  3.   Obesity - she has gained some weight but has not been exercising as much due to chronic back pain.  She is motivated to lose weight. 4.   Heart murmur - check 2D echo to assess    Medication Adjustments/Labs and Tests Ordered: Current medicines are reviewed at length with the patient today.  Concerns regarding medicines are outlined above.  Medication changes, Labs and Tests ordered today are listed in the Patient Instructions below.  There are no Patient Instructions on file for this visit.   Signed, Fransico Him, MD  01/01/2016 8:36 AM    [facility] [address], [place], [place]  [address] Phone: [phone]; Fax: [phone]

## 2016-01-04 ENCOUNTER — Ambulatory Visit (HOSPITAL_COMMUNITY): Payer: BC Managed Care – PPO | Attending: Cardiology

## 2016-01-04 ENCOUNTER — Other Ambulatory Visit: Payer: Self-pay

## 2016-01-04 DIAGNOSIS — E669 Obesity, unspecified: Secondary | ICD-10-CM | POA: Insufficient documentation

## 2016-01-04 DIAGNOSIS — Z6835 Body mass index (BMI) 35.0-35.9, adult: Secondary | ICD-10-CM | POA: Diagnosis not present

## 2016-01-04 DIAGNOSIS — I10 Essential (primary) hypertension: Secondary | ICD-10-CM | POA: Diagnosis not present

## 2016-01-04 DIAGNOSIS — I340 Nonrheumatic mitral (valve) insufficiency: Secondary | ICD-10-CM | POA: Diagnosis not present

## 2016-01-04 DIAGNOSIS — I358 Other nonrheumatic aortic valve disorders: Secondary | ICD-10-CM | POA: Insufficient documentation

## 2016-01-04 DIAGNOSIS — E785 Hyperlipidemia, unspecified: Secondary | ICD-10-CM | POA: Diagnosis not present

## 2016-01-04 DIAGNOSIS — Z8249 Family history of ischemic heart disease and other diseases of the circulatory system: Secondary | ICD-10-CM | POA: Insufficient documentation

## 2016-01-04 DIAGNOSIS — G4733 Obstructive sleep apnea (adult) (pediatric): Secondary | ICD-10-CM | POA: Insufficient documentation

## 2016-01-04 DIAGNOSIS — R011 Cardiac murmur, unspecified: Secondary | ICD-10-CM

## 2016-01-07 ENCOUNTER — Encounter: Payer: Self-pay | Admitting: Cardiology

## 2016-01-15 ENCOUNTER — Other Ambulatory Visit (HOSPITAL_COMMUNITY): Payer: BC Managed Care – PPO

## 2016-03-25 DIAGNOSIS — M5136 Other intervertebral disc degeneration, lumbar region: Secondary | ICD-10-CM | POA: Insufficient documentation

## 2016-03-25 DIAGNOSIS — M51369 Other intervertebral disc degeneration, lumbar region without mention of lumbar back pain or lower extremity pain: Secondary | ICD-10-CM | POA: Insufficient documentation

## 2016-04-18 HISTORY — PX: BACK SURGERY: SHX140

## 2016-05-26 HISTORY — PX: ANTERIOR LUMBAR FUSION: SHX1170

## 2016-06-11 ENCOUNTER — Telehealth: Payer: Self-pay | Admitting: Hematology and Oncology

## 2016-06-11 NOTE — Telephone Encounter (Signed)
Pt called to r/s 1/18 appt to 1/25 at 1015 am

## 2016-06-12 ENCOUNTER — Ambulatory Visit: Payer: BC Managed Care – PPO | Admitting: Hematology and Oncology

## 2016-06-13 ENCOUNTER — Ambulatory Visit: Payer: BC Managed Care – PPO | Admitting: Obstetrics and Gynecology

## 2016-06-13 NOTE — Progress Notes (Deleted)
61 y.o. [id number] Married Caucasian female here for annual exam.    PCP:     No LMP recorded. Patient has had a hysterectomy.           Sexually active: {yes no:314532}  The current method of family planning is status post hysterectomy.    Exercising: {yes no:314532}  {types:19826} Smoker:  no  Health Maintenance: Pap: 2009 Neg  History of abnormal Pap:  no MMG: 05-17-15 3D Density B/Neg/BiRads1:[facility] Colonoscopy:   09/09/12 Normal w/ Dr. Olevia Perches Next due in 08/2017  BMD: 01-05-14 Result:Normal TDaP:  11/2006 Gardasil:   N/A HIV: Hep C: Screening Labs:  Hb today: ***, Urine today: ***   reports that she has never smoked. She has never used smokeless tobacco. She reports that she does not drink alcohol or use drugs.  Past Medical History:  Diagnosis Date  . Allergy   . Anxiety    regarding  upcoming breast surgery  . DDD (degenerative disc disease)   . Diverticulosis of colon   . GERD (gastroesophageal reflux disease)   . Hearing loss   . Hyperlipidemia   . Hypertension   . IBS (irritable bowel syndrome)   . Mild sleep apnea   . Obesity (BMI 30-39.9) 06/27/2014  . OSA on CPAP 06/27/2014  . PONV (postoperative nausea and vomiting)   . Wears glasses     Past Surgical History:  Procedure Laterality Date  . ABDOMINAL HYSTERECTOMY  1994   TVH for DUB--ovaries retained    . BACK SURGERY  2011   lumb cyst  . BREAST SURGERY    . CERVICAL LAMINECTOMY    . CYST REMOVAL TRUNK     LOWER BACK  . DILATION AND CURETTAGE OF UTERUS    . KNEE ARTHROSCOPY    . KNEE ARTHROSCOPY Right 05/25/2013   Procedure: RIGHT ARTHROSCOPY KNEE, PARTIAL MEDIAL MENISCECTOMY, CHONDROPLASTY, PARTIAL LATERAL MENISCECTOMY;  Surgeon: Gearlean Alf, MD;  Location: [facility];  Service: Orthopedics;  Laterality: Right;  . PARTIAL MASTECTOMY WITH NEEDLE LOCALIZATION Left 06/03/2013   Procedure: PARTIAL MASTECTOMY WITH NEEDLE LOCALIZATION;  Surgeon: [name], MD;  Location: [facility];   Service: General;  Laterality: Left;  . SINUS IRRIGATION    . TONSILLECTOMY    . TRIGGER FINGER RELEASE    . WISDOM TOOTH EXTRACTION      Current Outpatient Prescriptions  Medication Sig Dispense Refill  . acetaminophen (PAIN RELIEVER EXTRA STRENGTH) 500 MG tablet Take by mouth as needed.     . calcium-vitamin D (OSCAL WITH D) 500-200 MG-UNIT per tablet Take 1 tablet by mouth 2 (two) times daily.    . diclofenac (VOLTAREN) 75 MG EC tablet Take 75 mg by mouth daily.     . fexofenadine (ALLEGRA) 180 MG tablet Take 180 mg by mouth at bedtime.     . fish oil-omega-3 fatty acids 1000 MG capsule Take 4 g by mouth daily.    . hydrochlorothiazide (HYDRODIURIL) 25 MG tablet TAKE ONE HALF TABLET DAILY.    Marland Kitchen losartan (COZAAR) 25 MG tablet Take 1 tablet (25 mg total) by mouth daily. 90 tablet 3  . meclizine (ANTIVERT) 25 MG tablet Take 25 mg by mouth as needed.     . metFORMIN (GLUCOPHAGE) 500 MG tablet Take 500 mg by mouth daily.    . methylcellulose (CITRUCEL) oral powder Take 1 packet by mouth as needed.     . nebivolol (BYSTOLIC) 10 MG tablet Take 10 mg by mouth every morning.    Marland Kitchen  omeprazole (PRILOSEC) 20 MG capsule Take 20 mg by mouth as needed.     . rosuvastatin (CRESTOR) 10 MG tablet Take 1 tablet by mouth 3 times a week. 15 tablet 11  . triamcinolone (NASACORT) 55 MCG/ACT AERO nasal inhaler 2 Squirts by Nasal route nightly.     No current facility-administered medications for this visit.     Family History  Problem Relation Age of Onset  . Heart disease Mother   . Deep vein thrombosis Mother   . Colon cancer Mother   . Rectal cancer Mother   . Lung disease Mother   . Heart disease Father   . COPD Father   . Rheum arthritis Father   . Osteoarthritis Father   . Heart disease Brother   . Pulmonary embolism Brother   . Rheum arthritis Daughter     ROS:  Pertinent items are noted in HPI.  Otherwise, a comprehensive ROS was negative.  Exam:   There were no vitals taken for this  visit.    General appearance: alert, cooperative and appears stated age Head: Normocephalic, without obvious abnormality, atraumatic Neck: no adenopathy, supple, symmetrical, trachea midline and thyroid normal to inspection and palpation Lungs: clear to auscultation bilaterally Breasts: normal appearance, no masses or tenderness, No nipple retraction or dimpling, No nipple discharge or bleeding, No axillary or supraclavicular adenopathy Heart: regular rate and rhythm Abdomen: soft, non-tender; no masses, no organomegaly Extremities: extremities normal, atraumatic, no cyanosis or edema Skin: Skin color, texture, turgor normal. No rashes or lesions Lymph nodes: Cervical, supraclavicular, and axillary nodes normal. No abnormal inguinal nodes palpated Neurologic: Grossly normal  Pelvic: External genitalia:  no lesions              Urethra:  normal appearing urethra with no masses, tenderness or lesions              Bartholins and Skenes: normal                 Vagina: normal appearing vagina with normal color and discharge, no lesions              Cervix: no lesions              Pap taken: {yes no:314532} Bimanual Exam:  Uterus:  normal size, contour, position, consistency, mobility, non-tender              Adnexa: no mass, fullness, tenderness              Rectal exam: {yes no:314532}.  Confirms.              Anus:  normal sphincter tone, no lesions  Chaperone was present for exam.  Assessment:   Well woman visit with normal exam.   Plan: Mammogram screening discussed. Recommended self breast awareness. Pap and HR HPV as above. Guidelines for Calcium, Vitamin D, regular exercise program including cardiovascular and weight bearing exercise.   Follow up annually and prn.   Additional counseling given.  {yes [id number]. _______ minutes face to face time of which over 50% was spent in counseling.    After visit summary provided.

## 2016-06-16 ENCOUNTER — Ambulatory Visit: Payer: BC Managed Care – PPO | Admitting: Obstetrics and Gynecology

## 2016-06-17 NOTE — Progress Notes (Signed)
Received [facility] MM report. Fu Dr.Gudena 06/19/16

## 2016-06-19 ENCOUNTER — Ambulatory Visit (HOSPITAL_BASED_OUTPATIENT_CLINIC_OR_DEPARTMENT_OTHER): Payer: BC Managed Care – PPO | Admitting: Hematology and Oncology

## 2016-06-19 DIAGNOSIS — N6099 Unspecified benign mammary dysplasia of unspecified breast: Secondary | ICD-10-CM

## 2016-06-19 DIAGNOSIS — N6092 Unspecified benign mammary dysplasia of left breast: Secondary | ICD-10-CM | POA: Diagnosis not present

## 2016-06-19 NOTE — Progress Notes (Signed)
Patient Care Team: Neale Burly, MD as PCP - General (Internal Medicine) Neale Burly, MD as Consulting Physician (Internal Medicine)  DIAGNOSIS:  Encounter Diagnosis  Name Primary?  . Atypical ductal hyperplasia, breast    CHIEF COMPLIANT:  Follow-up of atypical ductal hyperplasia  INTERVAL HISTORY: Ann Fernandez is a  61-year old with above-mentioned history of atypical ducal hyperplasia underwent surgery and could not tolerate antiestrogen therapy. She is here for routine surveillance visit She had a recent surgery on the back and she is able to move very well.  She denies any lumps or nodules in the breast.  REVIEW OF SYSTEMS:   Constitutional: Denies fevers, chills or abnormal weight loss  Eyes: Denies blurriness of vision Ears, nose, mouth, throat, and face: Denies mucositis or sore throat Respiratory: Denies cough, dyspnea or wheezes Cardiovascular: Denies palpitation, chest discomfort Gastrointestinal:  Denies nausea, heartburn or change in bowel habits Skin: Denies abnormal skin rashes Lymphatics: Denies new lymphadenopathy or easy bruising Neurological:Denies numbness, tingling or new weaknesses Behavioral/Psych: Mood is stable, no new changes  Extremities: No lower extremity edema Breast:  denies any pain or lumps or nodules in either breasts All other systems were reviewed with the patient and are negative.  I have reviewed the past medical history, past surgical history, social history and family history with the patient and they are unchanged from previous note.  ALLERGIES:  is allergic to demerol [meperidine]; penicillins; sulfa antibiotics; amlodipine; amlodipine besylate; amlodipine besylate; darvocet [propoxyphene n-acetaminophen]; hydrochlorothiazide w-triamterene; lipitor [atorvastatin]; lisinopril; meperidine hcl; propoxyphene; simvastatin; and sulfamethoxazole.  MEDICATIONS:  Current Outpatient Prescriptions  Medication Sig Dispense Refill  .  acetaminophen (PAIN RELIEVER EXTRA STRENGTH) 500 MG tablet Take by mouth as needed.     . calcium-vitamin D (OSCAL WITH D) 500-200 MG-UNIT per tablet Take 1 tablet by mouth 2 (two) times daily.    . diclofenac (VOLTAREN) 75 MG EC tablet Take 75 mg by mouth daily.     . fexofenadine (ALLEGRA) 180 MG tablet Take 180 mg by mouth at bedtime.     . fish oil-omega-3 fatty acids 1000 MG capsule Take 4 g by mouth daily.    . hydrochlorothiazide (HYDRODIURIL) 25 MG tablet TAKE ONE HALF TABLET DAILY.    Marland Kitchen losartan (COZAAR) 25 MG tablet Take 1 tablet (25 mg total) by mouth daily. 90 tablet 3  . meclizine (ANTIVERT) 25 MG tablet Take 25 mg by mouth as needed.     . metFORMIN (GLUCOPHAGE) 500 MG tablet Take 500 mg by mouth daily.    . methylcellulose (CITRUCEL) oral powder Take 1 packet by mouth as needed.     . nebivolol (BYSTOLIC) 10 MG tablet Take 10 mg by mouth every morning.    Marland Kitchen omeprazole (PRILOSEC) 20 MG capsule Take 20 mg by mouth as needed.     . rosuvastatin (CRESTOR) 10 MG tablet Take 1 tablet by mouth 3 times a week. 15 tablet 11  . triamcinolone (NASACORT) 55 MCG/ACT AERO nasal inhaler 2 Squirts by Nasal route nightly.     No current facility-administered medications for this visit.     PHYSICAL EXAMINATION: ECOG PERFORMANCE STATUS: 0 - Asymptomatic  Vitals:   06/19/16 1035  BP: (!) 151/63  Pulse: 62  Resp: 18  Temp: 98.2 F (36.8 C)   Filed Weights   06/19/16 1035  Weight: 210 lb 1.6 oz (95.3 kg)    GENERAL:alert, no distress and comfortable SKIN: skin color, texture, turgor are normal, no rashes or significant  lesions EYES: normal, Conjunctiva are pink and non-injected, sclera clear OROPHARYNX:no exudate, no erythema and lips, buccal mucosa, and tongue normal  NECK: supple, thyroid normal size, non-tender, without nodularity LYMPH:  no palpable lymphadenopathy in the cervical, axillary or inguinal LUNGS: clear to auscultation and percussion with normal breathing  effort HEART: regular rate & rhythm and no murmurs and no lower extremity edema ABDOMEN:abdomen soft, non-tender and normal bowel sounds MUSCULOSKELETAL:no cyanosis of digits and no clubbing  NEURO: alert & oriented x 3 with fluent speech, no focal motor/sensory deficits EXTREMITIES: No lower extremity edema BREAST: No palpable masses or nodules in either right or left breasts. No palpable axillary supraclavicular or infraclavicular adenopathy no breast tenderness or nipple discharge. (exam performed in the presence of a chaperone)  LABORATORY DATA:  I have reviewed the data as listed   Chemistry      Component Value Date/Time   NA 141 10/12/2015 1114   NA 141 06/12/2015 0918   K 4.0 10/12/2015 1114   K 4.9 06/12/2015 0918   CL 103 10/12/2015 1114   CO2 27 10/12/2015 1114   CO2 26 06/12/2015 0918   BUN 14 10/12/2015 1114   BUN 11.8 06/12/2015 0918   CREATININE 0.64 10/12/2015 1114   CREATININE 0.8 06/12/2015 0918      Component Value Date/Time   CALCIUM 9.5 10/12/2015 1114   CALCIUM 9.8 06/12/2015 0918   ALKPHOS 52 10/12/2015 1114   ALKPHOS 55 06/12/2015 0918   AST 20 10/12/2015 1114   AST 26 06/12/2015 0918   ALT 32 (H) 10/12/2015 1114   ALT 44 06/12/2015 0918   BILITOT 1.0 10/12/2015 1114   BILITOT 0.82 06/12/2015 0918       Lab Results  Component Value Date   WBC 6.4 06/12/2015   HGB 15.1 06/12/2015   HCT 45.5 06/12/2015   MCV 92.3 06/12/2015   PLT 174 06/12/2015   NEUTROABS 3.8 06/12/2015    ASSESSMENT & PLAN:  Atypical ductal hyperplasia, breast Left breast atypical ductal hyperplasia treated with lumpectomy but could not tolerate tamoxifen that was started in July 2015, could not tolerate Arimidex finally being discontinued after reducing the frequency of Arimidex to 4 days a week and could not tolerate that dose either and it was discontinued as of 06/08/2014  Recommendation: 1. Breast exam 06/19/2016: No palpable lumps or nodules 2. Mammogram  05/16/2016: No evidence of malignancy breast density category B 3. I encouraged her to exercise and lose weight and therefore decrease the risk of breast cancer that way. Since she had partial knee replacement she's doing much better 4. I also discussed the importance of healthy eating and eating more fruits and vegetables and less red meat.   patient sees a gynecologist who performs her breast exams.  patient will be seen on an as-needed basis   I spent 25 minutes talking to the patient of which more than half was spent in counseling and coordination of care.  No orders of the defined types were placed in this encounter.  The patient has a good understanding of the overall plan. she agrees with it. she will call with any problems that may develop before the next visit here.   Rulon Eisenmenger, MD 06/19/16

## 2016-06-19 NOTE — Assessment & Plan Note (Signed)
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 Breast exam 06/19/2016: No palpable lumps or nodules 2. Mammogram 05/17/2015: No evidence of malignancy breast density category B 3. I encouraged her to exercise and lose weight and therefore decrease the risk of breast cancer that way. Since she had partial knee replacement she's doing much better 4. I also discussed the importance of healthy eating and eating more fruits and vegetables and less red meat.  Patient agrees with these recommendations and will see [place] back in a year.

## 2016-06-25 ENCOUNTER — Encounter: Payer: Self-pay | Admitting: Obstetrics and Gynecology

## 2016-06-25 ENCOUNTER — Ambulatory Visit (INDEPENDENT_AMBULATORY_CARE_PROVIDER_SITE_OTHER): Payer: BC Managed Care – PPO | Admitting: Obstetrics and Gynecology

## 2016-06-25 VITALS — BP 160/90 | HR 62 | Resp 12 | Ht 64.25 in | Wt 210.6 lb

## 2016-06-25 DIAGNOSIS — N816 Rectocele: Secondary | ICD-10-CM | POA: Diagnosis not present

## 2016-06-25 DIAGNOSIS — Z01419 Encounter for gynecological examination (general) (routine) without abnormal findings: Secondary | ICD-10-CM

## 2016-06-25 DIAGNOSIS — Z0000 Encounter for general adult medical examination without abnormal findings: Secondary | ICD-10-CM

## 2016-06-25 DIAGNOSIS — Z23 Encounter for immunization: Secondary | ICD-10-CM | POA: Diagnosis not present

## 2016-06-25 LAB — POCT URINALYSIS DIPSTICK
Bilirubin, UA: NEGATIVE
Glucose, UA: NEGATIVE
Leukocytes, UA: NEGATIVE
Nitrite, UA: NEGATIVE
PH UA: 5
Protein, UA: NEGATIVE
RBC UA: NEGATIVE
Urobilinogen, UA: NEGATIVE

## 2016-06-25 NOTE — Progress Notes (Signed)
61 y.o. G22P2002 Married Caucasian female here for annual exam.    Has third degree rectocele.  Pain medication made her BMs more difficult.  Using Citrocel.  Has done splinting twice.  Emptying bladder well.  Sometimes has leak just prior to voiding.   Had a lumbar fusion.  Doing physical therapy.  Walking on treadmill.  PCP:  Dr. Telford [facility])   No LMP recorded. Patient has had a hysterectomy.           Sexually active: Yes.    The current method of family planning is status post hysterectomy.    Exercising: Yes.    Walking, Physical therapy Smoker:  no  Health Maintenance: Pap:  2009 -Neg History of abnormal Pap:  Yes.  States hx of cryotherapy. MMG:  05/17/15 BIRADS1, Density B (had on 06/04/16 @ [facility]) Colonoscopy: 09/09/12 Normal w/Dr. Olevia Perches. Due 08/2017 BMD:   01/05/14  Result  Normal TDaP:  11/2006 Screening Labs:  Urine today: Trace ketones, 5 pH.     reports that she has never smoked. She has never used smokeless tobacco. She reports that she does not drink alcohol or use drugs.  Past Medical History:  Diagnosis Date  . Allergy   . Anxiety    regarding  upcoming breast surgery  . Atypical ductal hyperplasia of left breast   . DDD (degenerative disc disease)   . Diverticulosis of colon   . GERD (gastroesophageal reflux disease)   . Hearing loss   . Hyperlipidemia   . Hypertension   . IBS (irritable bowel syndrome)   . Mild sleep apnea   . Obesity (BMI 30-39.9) 06/27/2014  . OSA on CPAP 06/27/2014  . PONV (postoperative nausea and vomiting)   . Wears glasses     Past Surgical History:  Procedure Laterality Date  . ABDOMINAL HYSTERECTOMY  1994   TVH for DUB--ovaries retained    . ANTERIOR LUMBAR FUSION    . BACK SURGERY  2011   lumb cyst  . BREAST SURGERY    . CERVICAL LAMINECTOMY    . CYST REMOVAL TRUNK     LOWER BACK  . DILATION AND CURETTAGE OF UTERUS    . KNEE ARTHROSCOPY    . KNEE ARTHROSCOPY Right 05/25/2013   Procedure: RIGHT ARTHROSCOPY  KNEE, PARTIAL MEDIAL MENISCECTOMY, CHONDROPLASTY, PARTIAL LATERAL MENISCECTOMY;  Surgeon: Gearlean Alf, MD;  Location: [facility];  Service: Orthopedics;  Laterality: Right;  . PARTIAL MASTECTOMY WITH NEEDLE LOCALIZATION Left 06/03/2013   Procedure: PARTIAL MASTECTOMY WITH NEEDLE LOCALIZATION;  Surgeon: [name], MD;  Location: [facility];  Service: General;  Laterality: Left;  . POSTERIOR LUMBAR FUSION    . SINUS IRRIGATION    . TONSILLECTOMY    . TRIGGER FINGER RELEASE    . WISDOM TOOTH EXTRACTION      Current Outpatient Prescriptions  Medication Sig Dispense Refill  . acetaminophen (PAIN RELIEVER EXTRA STRENGTH) 500 MG tablet Take by mouth as needed.     . calcium-vitamin D (OSCAL WITH D) 500-200 MG-UNIT per tablet Take 1 tablet by mouth 2 (two) times daily.    . diclofenac (VOLTAREN) 75 MG EC tablet Take 75 mg by mouth daily.     . fexofenadine (ALLEGRA) 180 MG tablet Take 180 mg by mouth at bedtime.     . fish oil-omega-3 fatty acids 1000 MG capsule Take 4 g by mouth daily.    Marland Kitchen gabapentin (NEURONTIN) 300 MG capsule     . hydrochlorothiazide (HYDRODIURIL) 25 MG  tablet TAKE ONE HALF TABLET DAILY.    Marland Kitchen losartan (COZAAR) 25 MG tablet Take 1 tablet (25 mg total) by mouth daily. 90 tablet 3  . meclizine (ANTIVERT) 25 MG tablet Take 25 mg by mouth as needed.     . metFORMIN (GLUCOPHAGE) 500 MG tablet Take 500 mg by mouth daily.    . methylcellulose (CITRUCEL) oral powder Take 1 packet by mouth as needed.     . mometasone (NASONEX) 50 MCG/ACT nasal spray Place into the nose.    . nebivolol (BYSTOLIC) 10 MG tablet Take 10 mg by mouth every morning.    . Omega-3 Fatty Acids (SUPER TWIN EPA/DHA) 1250 MG CAPS Take by mouth.    Marland Kitchen omeprazole (PRILOSEC) 20 MG capsule Take 20 mg by mouth as needed.     . rosuvastatin (CRESTOR) 10 MG tablet Take 1 tablet by mouth 3 times a week. 15 tablet 11   No current facility-administered medications for this visit.     Family History   Problem Relation Age of Onset  . Heart disease Mother   . Deep vein thrombosis Mother   . Colon cancer Mother   . Rectal cancer Mother   . Lung disease Mother   . Heart disease Father   . COPD Father   . Rheum arthritis Father   . Osteoarthritis Father   . Heart disease Brother   . Pulmonary embolism Brother   . Rheum arthritis Daughter     ROS:  Pertinent items are noted in HPI.  Otherwise, a comprehensive ROS was negative.  Exam:   BP (!) 160/90 (BP Location: Right Arm, Patient Position: Sitting, Cuff Size: Normal)   Pulse 62   Resp 12   Ht 5' 4.25" (1.632 m)   Wt 210 lb 9.6 oz (95.5 kg)   BMI 35.87 kg/m     General appearance: alert, cooperative and appears stated age Head: Normocephalic, without obvious abnormality, atraumatic Neck: no adenopathy, supple, symmetrical, trachea midline and thyroid normal to inspection and palpation Lungs: clear to auscultation bilaterally Breasts: normal appearance, no masses or tenderness, No nipple retraction or dimpling, No nipple discharge or bleeding, No axillary or supraclavicular adenopathy Heart: regular rate and rhythm Abdomen: midline vertical lower abdominal incision, soft, non-tender; no masses, no organomegaly Extremities: extremities normal, atraumatic, no cyanosis or edema Skin: Skin color, texture, turgor normal. No rashes or lesions Lymph nodes: Cervical, supraclavicular, and axillary nodes normal. No abnormal inguinal nodes palpated Neurologic: Grossly normal  Pelvic: External genitalia:  no lesions              Urethra:  normal appearing urethra with no masses, tenderness or lesions              Bartholins and Skenes: normal   hird degree rectocele. Good vaginal apical and anterior vaginal wall support.              Vagina: normal appearing vagina with normal color and discharge, no lesions.                Cervix: absent.               Pap taken: No. Bimanual Exam:  Uterus:  Absent.                              Adnexa: no mass, fullness, tenderness              Rectal exam: Yes.  Marland Kitchen  Confirms.              Anus:  normal sphincter tone, no lesions  Chaperone was present for exam.  Assessment:   Well woman visit with normal exam. Status post TVH for abnormal uterine bleeding, endometriosis, and fibroids. Ovaries remain.  Remote history of cryotherapy.  Rectocele - third degree.  Stable. History of atypical breast ductal hyperplasia.  Status post partial left mastectomy.  Family history of rectal cancer.   Plan: Mammogram screening discussed.  Discussed 3D mammograms yearly. Recommended self breast awareness. Pap and HR HPV as above. Guidelines for Calcium, Vitamin D, regular exercise program including cardiovascular and weight bearing exercise. TDap.  Labs with PCP. We discussed her rectocele and options for care - observation, pessary, and rectocele repair.  She is content to do continued observation.  Follow up annually and prn.       After visit summary provided.

## 2016-06-25 NOTE — Patient Instructions (Signed)

## 2016-07-02 ENCOUNTER — Encounter: Payer: Self-pay | Admitting: Obstetrics and Gynecology

## 2016-07-04 ENCOUNTER — Ambulatory Visit: Payer: BC Managed Care – PPO | Admitting: Obstetrics and Gynecology

## 2016-10-15 ENCOUNTER — Ambulatory Visit (INDEPENDENT_AMBULATORY_CARE_PROVIDER_SITE_OTHER): Payer: BC Managed Care – PPO | Admitting: Cardiology

## 2016-10-15 ENCOUNTER — Encounter: Payer: Self-pay | Admitting: Cardiology

## 2016-10-15 VITALS — BP 124/72 | HR 62 | Ht 65.0 in | Wt 212.0 lb

## 2016-10-15 DIAGNOSIS — R0600 Dyspnea, unspecified: Secondary | ICD-10-CM

## 2016-10-15 DIAGNOSIS — I10 Essential (primary) hypertension: Secondary | ICD-10-CM | POA: Diagnosis not present

## 2016-10-15 DIAGNOSIS — E784 Other hyperlipidemia: Secondary | ICD-10-CM

## 2016-10-15 DIAGNOSIS — R0609 Other forms of dyspnea: Secondary | ICD-10-CM | POA: Diagnosis not present

## 2016-10-15 DIAGNOSIS — E7849 Other hyperlipidemia: Secondary | ICD-10-CM

## 2016-10-15 NOTE — Patient Instructions (Signed)
Medication Instructions:   Your physician recommends that you continue on your current medications as directed. Please refer to the Current Medication list given to you today.    Labwork:  TODAY--CMET, CBC W DIFF, TSH, AND LIPIDS     Follow-Up:  Your physician wants you to follow-up in: ONE YEAR WITH DR NELSON You will receive a reminder letter in the mail two months in advance. If you don't receive a letter, please call our office to schedule the follow-up appointment.        If you need a refill on your cardiac medications before your next appointment, please call your pharmacy.   

## 2016-10-15 NOTE — Progress Notes (Signed)
Patient ID: Ann Fernandez, female   DOB: Aug 28, 1955, 61 y.o.   MRN: [id number]                  Cardiology Office Note   Date:  10/18/2016   ID:  Ann Fernandez, DOB Jul 10, 1955, MRN [id number]  PCP:  Neale Burly, MD   Chief complain: 1 year follow up   History of Present Illness: Ann Fernandez is a 61 y.o. female that comes for follow up of DO, hypertension and hyperlipidemia. She couldn't tolerate statins as she developed significant muscle pain. Her DOE has improved. She is using CPAP for OSA. Denies CP, palpitations, dizziness, falls or claudications.   10/12/2015 the patient is coming after 6 months, she feels very well her blood pressure is finally under control she denies any chest pain or shortness of breath has improved. She is now taking Kristen 10 mg 2-3 times a week if she takes it more on she feels some muscle pain. She can tolerate this. She underwent right knee replacement at [facility] by Dr. Bonnita Hollow and she is very pleased with the results. She is planning to have left knee replacement in about 3 months from now. She denies any palpitations or syncope no lower extremity edema or claudications.  10/15/16 - 1 year follow up, she is doing great, exercising and has no symptoms, she tolerates crestor 10 mg po 3x/week started by our lipid clinic. Denies LE edema, orthopnea, PND, no claudications or palpitations. She was started on a CPAP machine for OSA.   Past Medical History:  Diagnosis Date  . Allergy   . Anxiety    regarding  upcoming breast surgery  . Atypical ductal hyperplasia of left breast   . DDD (degenerative disc disease)   . Diverticulosis of colon   . GERD (gastroesophageal reflux disease)   . Hearing loss   . Hyperlipidemia   . Hypertension   . IBS (irritable bowel syndrome)   . Mild sleep apnea   . Obesity (BMI 30-39.9) 06/27/2014  . OSA on CPAP 06/27/2014  . PONV (postoperative nausea and vomiting)   . Wears glasses    Past Surgical History:    Procedure Laterality Date  . ABDOMINAL HYSTERECTOMY  1994   TVH for DUB--ovaries retained    . ANTERIOR LUMBAR FUSION    . BACK SURGERY  2011   lumb cyst  . BREAST SURGERY    . CERVICAL LAMINECTOMY    . CYST REMOVAL TRUNK     LOWER BACK  . DILATION AND CURETTAGE OF UTERUS    . KNEE ARTHROSCOPY    . KNEE ARTHROSCOPY Right 05/25/2013   Procedure: RIGHT ARTHROSCOPY KNEE, PARTIAL MEDIAL MENISCECTOMY, CHONDROPLASTY, PARTIAL LATERAL MENISCECTOMY;  Surgeon: Gearlean Alf, MD;  Location: [facility];  Service: Orthopedics;  Laterality: Right;  . PARTIAL MASTECTOMY WITH NEEDLE LOCALIZATION Left 06/03/2013   Procedure: PARTIAL MASTECTOMY WITH NEEDLE LOCALIZATION;  Surgeon: [name], MD;  Location: [facility];  Service: General;  Laterality: Left;  . POSTERIOR LUMBAR FUSION    . SINUS IRRIGATION    . TONSILLECTOMY    . TRIGGER FINGER RELEASE    . WISDOM TOOTH EXTRACTION     Current Outpatient Prescriptions  Medication Sig Dispense Refill  . acetaminophen (PAIN RELIEVER EXTRA STRENGTH) 500 MG tablet Take by mouth as needed.     . calcium-vitamin D (OSCAL WITH D) 500-200 MG-UNIT per tablet Take 1 tablet by mouth 2 (two)  times daily.    . diclofenac (VOLTAREN) 75 MG EC tablet Take 75 mg by mouth daily.     . fexofenadine (ALLEGRA) 180 MG tablet Take 180 mg by mouth at bedtime.     . fish oil-omega-3 fatty acids 1000 MG capsule Take 4 g by mouth daily.    Marland Kitchen gabapentin (NEURONTIN) 300 MG capsule     . hydrochlorothiazide (HYDRODIURIL) 25 MG tablet TAKE ONE HALF TABLET DAILY.    Marland Kitchen losartan (COZAAR) 25 MG tablet Take 1 tablet (25 mg total) by mouth daily. 90 tablet 3  . meclizine (ANTIVERT) 25 MG tablet Take 25 mg by mouth as needed.     . metFORMIN (GLUCOPHAGE) 500 MG tablet Take 500 mg by mouth daily.    . methylcellulose (CITRUCEL) oral powder Take 1 packet by mouth as needed.     . mometasone (NASONEX) 50 MCG/ACT nasal spray Place into the nose.    . nebivolol (BYSTOLIC) 10  MG tablet Take 10 mg by mouth every morning.    . Omega-3 Fatty Acids (SUPER TWIN EPA/DHA) 1250 MG CAPS Take by mouth.    Marland Kitchen omeprazole (PRILOSEC) 20 MG capsule Take 20 mg by mouth as needed.     . rosuvastatin (CRESTOR) 10 MG tablet Take 1 tablet by mouth 3 times a week. 15 tablet 11   No current facility-administered medications for this visit.    Allergies:   Demerol [meperidine]; Penicillins; Sulfa antibiotics; Amlodipine; Amlodipine besylate; Amlodipine besylate; Darvocet [propoxyphene n-acetaminophen]; Hydrochlorothiazide w-triamterene; Lipitor [atorvastatin]; Lisinopril; Meperidine hcl; Propoxyphene; Simvastatin; and Sulfamethoxazole   Social History:  The patient  reports that she has never smoked. She has never used smokeless tobacco. She reports that she does not drink alcohol or use drugs.   Family History:  The patient's family history includes COPD in her father; Colon cancer in her mother; Deep vein thrombosis in her mother; Heart disease in her brother, father, and mother; Lung disease in her mother; Osteoarthritis in her father; Pulmonary embolism in her brother; Rectal cancer in her mother; Rheum arthritis in her daughter and father.   ROS:  Please see the history of present illness.   Otherwise, review of systems are positive for none.   All other systems are reviewed and negative.   PHYSICAL EXAM: VS:  BP 124/72   Pulse 62   Ht 5\' 5"  (1.651 m)   Wt 212 lb (96.2 kg)   BMI 35.28 kg/m  , BMI Body mass index is 35.28 kg/m. GEN: Well nourished, well developed, in no acute distress  HEENT: normal  Neck: no JVD, carotid bruits, or masses Cardiac: RRR; no murmurs, rubs, or gallops,no edema  Respiratory:  clear to auscultation bilaterally, normal work of breathing GI: soft, nontender, nondistended, + BS MS: no deformity or atrophy  Skin: warm and dry, no rash Neuro:  Strength and sensation are intact Psych: euthymic mood, full affect  EKG:  EKG is not ordered  today.  Recent Labs: 10/15/2016: ALT 39; BUN 9; Creatinine, Ser 0.63; Platelets 197; Potassium 4.2; Sodium 142; TSH 1.790   Lipid Panel    Component Value Date/Time   CHOL 196 10/15/2016 1051   TRIG 256 (H) 10/15/2016 1051   HDL 43 10/15/2016 1051   CHOLHDL 4.6 (H) 10/15/2016 1051   CHOLHDL 4.4 10/12/2015 1114   VLDL 44 (H) 10/12/2015 1114   LDLCALC 102 (H) 10/15/2016 1051   LDLDIRECT 172.8 06/29/2012 1003   Wt Readings from Last 3 Encounters:  10/15/16 212 lb (96.2  kg)  06/25/16 210 lb 9.6 oz (95.5 kg)  06/19/16 210 lb 1.6 oz (95.3 kg)    TTE: 01/2014 - Left ventricle: The cavity size was normal. Wall thickness was increased in a pattern of mild LVH. Systolic function was normal. The estimated ejection fraction was in the range of 60% to 65%. Wall motion was normal; there were no regional wall motion abnormalities. Features are consistent with a pseudonormal left ventricular filling pattern, with concomitant abnormal relaxation and increased filling pressure (grade 2 diastolic dysfunction). - Left atrium: The atrium was mildly dilated. Anterior-posterior dimension: 45 mm. 31.40ml/m2 - Index.    ASSESSMENT AND PLAN:  1. CP and DOE - abnormal baseline ECG, however normal stress test, no prior infarct, no ischemia, normal LVEF, Her EKG today is unchanged from prior. Her echocardiogram showed normal LVEF grade 2 diastolic dysfunction, but she has no signs of CHF. We will treat aggressively with great hypertension control. She is now asymptomatic.  2. HTN - finally controlled with improvement of symptoms. Check CMP.   3. Hyperlipidemia - intolerant to multiple statins (atorvastatin, simvastatin, pravastatin), baseline LDL 147,  FH of premature CAD, she was referred to lipid clinic and started on Crestor 10 mg to be taken 3 times a day, she takes it twice a week, we will check lipids and CMP today.  Disposition:   FU with me in 1 year.  Signed, Ena Dawley,  MD  10/18/2016 7:01 AM    [facility] [address], [place], [place]  [address] Phone: [phone]; Fax: [phone]   ROS

## 2016-10-16 LAB — COMPREHENSIVE METABOLIC PANEL
ALT: 39 IU/L — ABNORMAL HIGH (ref 0–32)
AST: 23 IU/L (ref 0–40)
Albumin/Globulin Ratio: 2.3 — ABNORMAL HIGH (ref 1.2–2.2)
Albumin: 4.6 g/dL (ref 3.6–4.8)
Alkaline Phosphatase: 76 IU/L (ref 39–117)
BUN/Creatinine Ratio: 14 (ref 12–28)
BUN: 9 mg/dL (ref 8–27)
Bilirubin Total: 0.9 mg/dL (ref 0.0–1.2)
CO2: 25 mmol/L (ref 18–29)
Calcium: 9.6 mg/dL (ref 8.7–10.3)
Chloride: 99 mmol/L (ref 96–106)
Creatinine, Ser: 0.63 mg/dL (ref 0.57–1.00)
GFR calc Af Amer: 113 mL/min/{1.73_m2} (ref >59)
GFR calc non Af Amer: 98 mL/min/{1.73_m2} (ref >59)
Globulin, Total: 2 g/dL (ref 1.5–4.5)
Glucose: 91 mg/dL (ref 65–99)
Potassium: 4.2 mmol/L (ref 3.5–5.2)
Sodium: 142 mmol/L (ref 134–144)
Total Protein: 6.6 g/dL (ref 6.0–8.5)

## 2016-10-16 LAB — LIPID PANEL
Chol/HDL Ratio: 4.6 ratio — ABNORMAL HIGH (ref 0.0–4.4)
Cholesterol, Total: 196 mg/dL (ref 100–199)
HDL: 43 mg/dL (ref >39)
LDL Calculated: 102 mg/dL — ABNORMAL HIGH (ref 0–99)
Triglycerides: 256 mg/dL — ABNORMAL HIGH (ref 0–149)
VLDL Cholesterol Cal: 51 mg/dL — ABNORMAL HIGH (ref 5–40)

## 2016-10-16 LAB — CBC WITH DIFFERENTIAL/PLATELET
Basophils Absolute: 0 10*3/uL (ref 0.0–0.2)
Basos: 0 %
EOS (ABSOLUTE): 0.2 10*3/uL (ref 0.0–0.4)
Eos: 2 %
Hematocrit: 42 % (ref 34.0–46.6)
Hemoglobin: 14.2 g/dL (ref 11.1–15.9)
Immature Grans (Abs): 0 10*3/uL (ref 0.0–0.1)
Immature Granulocytes: 0 %
Lymphocytes Absolute: 1.9 10*3/uL (ref 0.7–3.1)
Lymphs: 25 %
MCH: 30.8 pg (ref 26.6–33.0)
MCHC: 33.8 g/dL (ref 31.5–35.7)
MCV: 91 fL (ref 79–97)
Monocytes Absolute: 0.5 10*3/uL (ref 0.1–0.9)
Monocytes: 7 %
Neutrophils Absolute: 5.1 10*3/uL (ref 1.4–7.0)
Neutrophils: 66 %
Platelets: 197 10*3/uL (ref 150–379)
RBC: 4.61 x10E6/uL (ref 3.77–5.28)
RDW: 15.3 % (ref 12.3–15.4)
WBC: 7.7 10*3/uL (ref 3.4–10.8)

## 2016-10-16 LAB — TSH: TSH: 1.79 u[IU]/mL (ref 0.450–4.500)

## 2016-11-18 ENCOUNTER — Other Ambulatory Visit: Payer: Self-pay | Admitting: *Deleted

## 2016-11-18 MED ORDER — ROSUVASTATIN CALCIUM 10 MG PO TABS: ORAL_TABLET | ORAL | 10 refills | Status: DC

## 2017-01-05 ENCOUNTER — Encounter: Payer: Self-pay | Admitting: Cardiology

## 2017-01-21 ENCOUNTER — Ambulatory Visit (INDEPENDENT_AMBULATORY_CARE_PROVIDER_SITE_OTHER): Payer: BC Managed Care – PPO | Admitting: Cardiology

## 2017-01-21 ENCOUNTER — Encounter: Payer: Self-pay | Admitting: Cardiology

## 2017-01-21 ENCOUNTER — Ambulatory Visit: Payer: BC Managed Care – PPO | Admitting: Cardiology

## 2017-01-21 VITALS — BP 140/72 | HR 58 | Ht 65.0 in | Wt 215.0 lb

## 2017-01-21 DIAGNOSIS — E669 Obesity, unspecified: Secondary | ICD-10-CM | POA: Diagnosis not present

## 2017-01-21 DIAGNOSIS — I10 Essential (primary) hypertension: Secondary | ICD-10-CM | POA: Diagnosis not present

## 2017-01-21 DIAGNOSIS — G4733 Obstructive sleep apnea (adult) (pediatric): Secondary | ICD-10-CM | POA: Diagnosis not present

## 2017-01-21 NOTE — Patient Instructions (Signed)

## 2017-01-21 NOTE — Progress Notes (Signed)
Cardiology Office Note:    Date:  01/21/2017   ID:  Ann Fernandez, DOB 1955/09/02, MRN [id number]  PCP:  Neale Burly, MD  Cardiologist:  Fransico Him, MD   Referring MD: Neale Burly, MD   Chief Complaint  Patient presents with  . Sleep Apnea    History of Present Illness:    Ann Fernandez is a 61 y.o. female with a hx of followup of sleep apnea. She has a history of mild OSA with an AHI of 12.4 events per hour. She is on CPAP at 9cm H2O.  She is doing well with her device. She tolerates her nasal mask with no chin strap and is not having any problems.She feels the pressure is fine.  She continues to feel rested in the am and has no daytime sleepiness and does not nap during the day.  She does not think that she snores. She is back to exercising after having back surgery.  Occasionally she will have some mild mouth and nasal dryness but not too bad.    Past Medical History:  Diagnosis Date  . Allergy   . Anxiety    regarding  upcoming breast surgery  . Atypical ductal hyperplasia of left breast   . DDD (degenerative disc disease)   . Diverticulosis of colon   . GERD (gastroesophageal reflux disease)   . Hearing loss   . Hyperlipidemia   . Hypertension   . IBS (irritable bowel syndrome)   . Mild sleep apnea   . Obesity (BMI 30-39.9) 06/27/2014  . OSA on CPAP 06/27/2014  . PONV (postoperative nausea and vomiting)   . Wears glasses     Past Surgical History:  Procedure Laterality Date  . ABDOMINAL HYSTERECTOMY  1994   TVH for DUB--ovaries retained    . ANTERIOR LUMBAR FUSION    . BACK SURGERY  2011   lumb cyst  . BREAST SURGERY    . CERVICAL LAMINECTOMY    . CYST REMOVAL TRUNK     LOWER BACK  . DILATION AND CURETTAGE OF UTERUS    . KNEE ARTHROSCOPY    . KNEE ARTHROSCOPY Right 05/25/2013   Procedure: RIGHT ARTHROSCOPY KNEE, PARTIAL MEDIAL MENISCECTOMY, CHONDROPLASTY, PARTIAL LATERAL MENISCECTOMY;  Surgeon: Gearlean Alf, MD;  Location: [facility];   Service: Orthopedics;  Laterality: Right;  . PARTIAL MASTECTOMY WITH NEEDLE LOCALIZATION Left 06/03/2013   Procedure: PARTIAL MASTECTOMY WITH NEEDLE LOCALIZATION;  Surgeon: [name], MD;  Location: [facility];  Service: General;  Laterality: Left;  . POSTERIOR LUMBAR FUSION    . SINUS IRRIGATION    . TONSILLECTOMY    . TRIGGER FINGER RELEASE    . WISDOM TOOTH EXTRACTION      Current Medications: Current Meds  Medication Sig  . acetaminophen (PAIN RELIEVER EXTRA STRENGTH) 500 MG tablet Take by mouth as needed.   . calcium-vitamin D (OSCAL WITH D) 500-200 MG-UNIT per tablet Take 1 tablet by mouth 2 (two) times daily.  . diclofenac (VOLTAREN) 75 MG EC tablet Take 75 mg by mouth daily.   . fexofenadine (ALLEGRA) 180 MG tablet Take 180 mg by mouth at bedtime.   . fish oil-omega-3 fatty acids 1000 MG capsule Take 4 g by mouth daily.  Marland Kitchen gabapentin (NEURONTIN) 300 MG capsule   . hydrochlorothiazide (HYDRODIURIL) 25 MG tablet TAKE ONE HALF TABLET DAILY.  Marland Kitchen losartan (COZAAR) 25 MG tablet Take 1 tablet (25 mg total) by mouth daily.  . meclizine (ANTIVERT) 25  MG tablet Take 25 mg by mouth as needed.   . metFORMIN (GLUCOPHAGE) 500 MG tablet Take 500 mg by mouth daily.  . methylcellulose (CITRUCEL) oral powder Take 1 packet by mouth as needed.   . mometasone (NASONEX) 50 MCG/ACT nasal spray Place into the nose.  . nebivolol (BYSTOLIC) 10 MG tablet Take 10 mg by mouth every morning.  . Omega-3 Fatty Acids (SUPER TWIN EPA/DHA) 1250 MG CAPS Take by mouth.  Marland Kitchen omeprazole (PRILOSEC) 20 MG capsule Take 20 mg by mouth as needed.   . rosuvastatin (CRESTOR) 10 MG tablet Take 1 tablet by mouth 3 times a week.     Allergies:   Demerol [meperidine]; Penicillins; Sulfa antibiotics; Sulfasalazine; Amlodipine; Amlodipine besylate; Amlodipine besylate; Darvocet [propoxyphene n-acetaminophen]; Hydrochlorothiazide w-triamterene; Lipitor [atorvastatin]; Lisinopril; Meperidine hcl; Propoxyphene;  Simvastatin; and Sulfamethoxazole   Social History   Social History  . Marital status: Married    Spouse name: N/A  . Number of children: N/A  . Years of education: N/A   Social History Main Topics  . Smoking status: Never Smoker  . Smokeless tobacco: Never Used  . Alcohol use No  . Drug use: No  . Sexual activity: Yes    Partners: Male     Comment: TVH   Other Topics Concern  . None   Social History Narrative  . None     Family History: The patient's family history includes COPD in her father; Colon cancer in her mother; Deep vein thrombosis in her mother; Heart disease in her brother, father, and mother; Lung disease in her mother; Osteoarthritis in her father; Pulmonary embolism in her brother; Rectal cancer in her mother; Rheum arthritis in her daughter and father.  ROS:   Please see the history of present illness.     All other systems reviewed and are negative.  EKGs/Labs/Other Studies Reviewed:    The following studies were reviewed today: CPAP download  EKG:  EKG is not ordered today.    Recent Labs: 10/15/2016: ALT 39; BUN 9; Creatinine, Ser 0.63; Hemoglobin 14.2; Platelets 197; Potassium 4.2; Sodium 142; TSH 1.790   Recent Lipid Panel    Component Value Date/Time   CHOL 196 10/15/2016 1051   TRIG 256 (H) 10/15/2016 1051   HDL 43 10/15/2016 1051   CHOLHDL 4.6 (H) 10/15/2016 1051   CHOLHDL 4.4 10/12/2015 1114   VLDL 44 (H) 10/12/2015 1114   LDLCALC 102 (H) 10/15/2016 1051   LDLDIRECT 172.8 06/29/2012 1003    Physical Exam:    VS:  BP 140/72   Pulse (!) 58   Ht 5\' 5"  (1.651 m)   Wt 215 lb (97.5 kg)   SpO2 95%   BMI 35.78 kg/m     Wt Readings from Last 3 Encounters:  01/21/17 215 lb (97.5 kg)  10/15/16 212 lb (96.2 kg)  06/25/16 210 lb 9.6 oz (95.5 kg)     GEN:  Well nourished, well developed in no acute distress HEENT: Normal NECK: No JVD; No carotid bruits LYMPHATICS: No lymphadenopathy CARDIAC: RRR, no murmurs, rubs,  gallops RESPIRATORY:  Clear to auscultation without rales, wheezing or rhonchi  ABDOMEN: Soft, non-tender, non-distended MUSCULOSKELETAL:  No edema; No deformity  SKIN: Warm and dry NEUROLOGIC:  Alert and oriented x 3 PSYCHIATRIC:  Normal affect   ASSESSMENT:    1. OSA (obstructive sleep apnea)   2. Essential hypertension   3. Obesity (BMI 30-39.9)    PLAN:    In order of problems listed above:  OSA -  the patient is tolerating PAP therapy well without any problems. The PAP download was reviewed today and showed an AHI of 0.9/hr on 8 cm H2O with 97% compliance in using more than 4 hours nightly.  The patient has been using and benefiting from CPAP use and will continue to benefit from therapy.  HTN - BP controlled on exam today. She will continue on  HCTZ, losartan and Bystolic. Obesity - I have encouraged her to continue in her routine exercise program and cut back on carbs and portions.     Medication Adjustments/Labs and Tests Ordered: Current medicines are reviewed at length with the patient today.  Concerns regarding medicines are outlined above.  No orders of the defined types were placed in this encounter.  No orders of the defined types were placed in this encounter.   Signed, Fransico Him, MD  01/21/2017 2:22 PM    [facility]

## 2017-05-28 DIAGNOSIS — R053 Chronic cough: Secondary | ICD-10-CM | POA: Insufficient documentation

## 2017-07-10 ENCOUNTER — Ambulatory Visit (INDEPENDENT_AMBULATORY_CARE_PROVIDER_SITE_OTHER): Payer: BC Managed Care – PPO | Admitting: Obstetrics and Gynecology

## 2017-07-10 ENCOUNTER — Other Ambulatory Visit: Payer: Self-pay

## 2017-07-10 ENCOUNTER — Encounter: Payer: Self-pay | Admitting: Obstetrics and Gynecology

## 2017-07-10 VITALS — BP 140/80 | HR 72 | Resp 16 | Ht 64.75 in | Wt 220.0 lb

## 2017-07-10 DIAGNOSIS — K432 Incisional hernia without obstruction or gangrene: Secondary | ICD-10-CM | POA: Diagnosis not present

## 2017-07-10 DIAGNOSIS — R1031 Right lower quadrant pain: Secondary | ICD-10-CM | POA: Diagnosis not present

## 2017-07-10 DIAGNOSIS — Z01419 Encounter for gynecological examination (general) (routine) without abnormal findings: Secondary | ICD-10-CM | POA: Diagnosis not present

## 2017-07-10 DIAGNOSIS — R635 Abnormal weight gain: Secondary | ICD-10-CM | POA: Diagnosis not present

## 2017-07-10 NOTE — Patient Instructions (Addendum)
EXERCISE AND DIET:  We recommended that you start or continue a regular exercise program for good health. Regular exercise means any activity that makes your heart beat faster and makes you sweat.  We recommend exercising at least 30 minutes per day at least 3 days a week, preferably 4 or 5.  We also recommend a diet low in fat and sugar.  Inactivity, poor dietary choices and obesity can cause diabetes, heart attack, stroke, and kidney damage, among others.    ALCOHOL AND SMOKING:  Women should limit their alcohol intake to no more than 7 drinks/beers/glasses of wine (combined, not each!) per week. Moderation of alcohol intake to this level decreases your risk of breast cancer and liver damage. And of course, no recreational drugs are part of a healthy lifestyle.  And absolutely no smoking or even second hand smoke. Most people know smoking can cause heart and lung diseases, but did you know it also contributes to weakening of your bones? Aging of your skin?  Yellowing of your teeth and nails?  CALCIUM AND VITAMIN D:  Adequate intake of calcium and Vitamin D are recommended.  The recommendations for exact amounts of these supplements seem to change often, but generally speaking 600 mg of calcium (either carbonate or citrate) and 800 units of Vitamin D per day seems prudent. Certain women may benefit from higher intake of Vitamin D.  If you are among these women, your doctor will have told you during your visit.    PAP SMEARS:  Pap smears, to check for cervical cancer or precancers,  have traditionally been done yearly, although recent scientific advances have shown that most women can have pap smears less often.  However, every woman still should have a physical exam from her gynecologist every year. It will include a breast check, inspection of the vulva and vagina to check for abnormal growths or skin changes, a visual exam of the cervix, and then an exam to evaluate the size and shape of the uterus and  ovaries.  And after 62 years of age, a rectal exam is indicated to check for rectal cancers. We will also provide age appropriate advice regarding health maintenance, like when you should have certain vaccines, screening for sexually transmitted diseases, bone density testing, colonoscopy, mammograms, etc.   MAMMOGRAMS:  All women over 40 years old should have a yearly mammogram. Many facilities now offer a "3D" mammogram, which may cost around $50 extra out of pocket. If possible,  we recommend you accept the option to have the 3D mammogram performed.  It both reduces the number of women who will be called back for extra views which then turn out to be normal, and it is better than the routine mammogram at detecting truly abnormal areas.    COLONOSCOPY:  Colonoscopy to screen for colon cancer is recommended for all women at age 50.  We know, you hate the idea of the prep.  We agree, BUT, having colon cancer and not knowing it is worse!!  Colon cancer so often starts as a polyp that can be seen and removed at colonscopy, which can quite literally save your life!  And if your first colonoscopy is normal and you have no family history of colon cancer, most women don't have to have it again for 10 years.  Once every ten years, you can do something that may end up saving your life, right?  We will be happy to help you get it scheduled when you are ready.    Be sure to check your insurance coverage so you understand how much it will cost.  It may be covered as a preventative service at no cost, but you should check your particular policy.      Exercising to Lose Weight Exercising can help you to lose weight. In order to lose weight through exercise, you need to do vigorous-intensity exercise. You can tell that you are exercising with vigorous intensity if you are breathing very hard and fast and cannot hold a conversation while exercising. Moderate-intensity exercise helps to maintain your current weight. You can  tell that you are exercising at a moderate level if you have a higher heart rate and faster breathing, but you are still able to hold a conversation. How often should I exercise? Choose an activity that you enjoy and set realistic goals. Your health care provider can help you to make an activity plan that works for you. Exercise regularly as directed by your health care provider. This may include:  Doing resistance training twice each week, such as: ? Push-ups. ? Sit-ups. ? Lifting weights. ? Using resistance bands.  Doing a given intensity of exercise for a given amount of time. Choose from these options: ? 150 minutes of moderate-intensity exercise every week. ? 75 minutes of vigorous-intensity exercise every week. ? A mix of moderate-intensity and vigorous-intensity exercise every week.  Children, pregnant women, people who are out of shape, people who are overweight, and older adults may need to consult a health care provider for individual recommendations. If you have any sort of medical condition, be sure to consult your health care provider before starting a new exercise program. What are some activities that can help me to lose weight?  Walking at a rate of at least 4.5 miles an hour.  Jogging or running at a rate of 5 miles per hour.  Biking at a rate of at least 10 miles per hour.  Lap swimming.  Roller-skating or in-line skating.  Cross-country skiing.  Vigorous competitive sports, such as football, basketball, and soccer.  Jumping rope.  Aerobic dancing. How can I be more active in my day-to-day activities?  Use the stairs instead of the elevator.  Take a walk during your lunch break.  If you drive, park your car farther away from work or school.  If you take public transportation, get off one stop early and walk the rest of the way.  Make all of your phone calls while standing up and walking around.  Get up, stretch, and walk around every 30 minutes  throughout the day. What guidelines should I follow while exercising?  Do not exercise so much that you hurt yourself, feel dizzy, or get very short of breath.  Consult your health care provider prior to starting a new exercise program.  Wear comfortable clothes and shoes with good support.  Drink plenty of water while you exercise to prevent dehydration or heat stroke. Body water is lost during exercise and must be replaced.  Work out until you breathe faster and your heart beats faster. This information is not intended to replace advice given to you by your health care provider. Make sure you discuss any questions you have with your health care provider. Document Released: 06/14/2010 Document Revised: 10/18/2015 Document Reviewed: 10/13/2013 Elsevier Interactive Patient Education  [employer].

## 2017-07-10 NOTE — Progress Notes (Signed)
62 y.o. G65P2002 Married Caucasian female here for annual exam.    Worried about weight gain.  Has not seen nutrition management.  Is prediabetic.   Occasional strong cramp in her right lower quadrant.  Also notes pain in central abdomen with BMs.  Had labs with PCP in 2 weeks.   PCP:  Dr. Sherrie Sport   No LMP recorded. Patient has had a hysterectomy.           Sexually active: Yes.    The current method of family planning is status post hysterectomy -- ovaries remain.    Exercising: Yes.    walking Smoker:  no  Health Maintenance: Pap:  2009 Negative History of abnormal Pap:  Yes.  States hx of cryotherapy MMG: 06/22/17 BIRADS 1 negative/density b -- [facility] called for results Colonoscopy:  09/09/12 Normal w/Dr. Olevia Perches. Due 08/2017.   BMD:   01/05/14  Result  Normal TDaP:  06/25/16  Gardasil:   n/a HIV: unsure Hep C: unsure Screening Labs:  PCP   reports that  has never smoked. she has never used smokeless tobacco. She reports that she does not drink alcohol or use drugs.  Past Medical History:  Diagnosis Date  . Allergy   . Anxiety    regarding  upcoming breast surgery  . Atypical ductal hyperplasia of left breast   . DDD (degenerative disc disease)   . Diverticulosis of colon   . GERD (gastroesophageal reflux disease)   . Hearing loss   . Hyperlipidemia   . Hypertension   . IBS (irritable bowel syndrome)   . Mild sleep apnea   . Obesity (BMI 30-39.9) 06/27/2014  . OSA on CPAP 06/27/2014  . PONV (postoperative nausea and vomiting)   . Wears glasses     Past Surgical History:  Procedure Laterality Date  . ABDOMINAL HYSTERECTOMY  1994   TVH for DUB--ovaries retained    . ANTERIOR LUMBAR FUSION    . BACK SURGERY  2011   lumb cyst  . BREAST SURGERY    . CERVICAL LAMINECTOMY    . CYST REMOVAL TRUNK     LOWER BACK  . DILATION AND CURETTAGE OF UTERUS    . KNEE ARTHROSCOPY    . KNEE ARTHROSCOPY Right 05/25/2013   Procedure: RIGHT ARTHROSCOPY KNEE, PARTIAL MEDIAL  MENISCECTOMY, CHONDROPLASTY, PARTIAL LATERAL MENISCECTOMY;  Surgeon: Gearlean Alf, MD;  Location: [facility];  Service: Orthopedics;  Laterality: Right;  . PARTIAL MASTECTOMY WITH NEEDLE LOCALIZATION Left 06/03/2013   Procedure: PARTIAL MASTECTOMY WITH NEEDLE LOCALIZATION;  Surgeon: [name], MD;  Location: [facility];  Service: General;  Laterality: Left;  . POSTERIOR LUMBAR FUSION    . SINUS IRRIGATION    . TONSILLECTOMY    . TRIGGER FINGER RELEASE    . WISDOM TOOTH EXTRACTION      Current Outpatient Medications  Medication Sig Dispense Refill  . acetaminophen (PAIN RELIEVER EXTRA STRENGTH) 500 MG tablet Take by mouth as needed.     . calcium-vitamin D (OSCAL WITH D) 500-200 MG-UNIT per tablet Take 1 tablet by mouth 2 (two) times daily.    . diclofenac (VOLTAREN) 75 MG EC tablet Take 75 mg by mouth daily.     . fexofenadine (ALLEGRA) 180 MG tablet Take 180 mg by mouth at bedtime.     . fish oil-omega-3 fatty acids 1000 MG capsule Take 4 g by mouth daily.    . hydrochlorothiazide (HYDRODIURIL) 25 MG tablet TAKE ONE HALF TABLET DAILY.    Marland Kitchen  losartan (COZAAR) 25 MG tablet Take 1 tablet (25 mg total) by mouth daily. 90 tablet 3  . meclizine (ANTIVERT) 25 MG tablet Take 25 mg by mouth as needed.     . metFORMIN (GLUCOPHAGE) 500 MG tablet Take 500 mg by mouth daily.    . methylcellulose (CITRUCEL) oral powder Take 1 packet by mouth as needed.     . mometasone (NASONEX) 50 MCG/ACT nasal spray Place into the nose.    . nebivolol (BYSTOLIC) 10 MG tablet Take 10 mg by mouth every morning.    Marland Kitchen omeprazole (PRILOSEC) 20 MG capsule Take 20 mg by mouth as needed.     . rosuvastatin (CRESTOR) 10 MG tablet Take 1 tablet by mouth 3 times a week. 15 tablet 10   No current facility-administered medications for this visit.     Family History  Problem Relation Age of Onset  . Heart disease Mother   . Deep vein thrombosis Mother   . Colon cancer Mother   . Rectal cancer Mother   .  Lung disease Mother   . Heart disease Father   . COPD Father   . Rheum arthritis Father   . Osteoarthritis Father   . Heart disease Brother   . Pulmonary embolism Brother   . Rheum arthritis Daughter     ROS:  Pertinent items are noted in HPI.  Otherwise, a comprehensive ROS was negative.  Exam:   BP 140/80 (BP Location: Right Arm, Patient Position: Sitting, Cuff Size: Large)   Pulse 72   Resp 16   Ht 5' 4.75" (1.645 m)   Wt 220 lb (99.8 kg)   BMI 36.89 kg/m     General appearance: alert, cooperative and appears stated age Head: Normocephalic, without obvious abnormality, atraumatic Neck: no adenopathy, supple, symmetrical, trachea midline and thyroid normal to inspection and palpation Lungs: clear to auscultation bilaterally Breasts: normal appearance, no masses or tenderness, No nipple retraction or dimpling, No nipple discharge or bleeding, No axillary or supraclavicular adenopathy Heart: regular rate and rhythm Abdomen: vertical midline ncision with palpable mass - hernia, nontender, soft, non-tender; no masses, no organomegaly Extremities: extremities normal, atraumatic, no cyanosis or edema Skin: Skin color, texture, turgor normal. No rashes or lesions Lymph nodes: Cervical, supraclavicular, and axillary nodes normal. No abnormal inguinal nodes palpated Neurologic: Grossly normal  Pelvic: External genitalia:  no lesions              Urethra:  normal appearing urethra with no masses, tenderness or lesions              Bartholins and Skenes: normal                 Vagina: normal appearing vagina with normal color and discharge, no lesions, first degree rectocele.               Cervix:  Absent.              Pap taken: No. Bimanual Exam:  Uterus:   absent              Adnexa: no mass, fullness, tenderness              Rectal exam: Yes.  .  Confirms.              Anus:  normal sphincter tone, no lesions  Chaperone was present for exam.  Assessment:   Well woman visit  with normal exam. Status post TVH for abnormal uterine bleeding, endometriosis,  and fibroids. Ovaries remain.  Remote history of cryotherapy.  Rectocele - less prominent today. History of atypical breast ductal hyperplasia. Status post partial left mastectomy.  Family history of rectal cancer.  FH DVT/PE.  Mother with DVT and hx malignancy.  Brother with CAD at a young age.  Obesity.  RLQ pain. Incisional hernia.  Plan: Mammogram screening discussed. Recommended self breast awareness. Pap and HR HPV as above. Guidelines for Calcium, Vitamin D, regular exercise program including cardiovascular and weight bearing exercise. Referral to Nutrition and DM management.  Return for ultrasound to check ovaries and incisional area.  I anticipate referral to general surgery after ultrasound is done.  Follow up annually and prn.  After visit summary provided.

## 2017-07-13 ENCOUNTER — Telehealth: Payer: Self-pay | Admitting: Obstetrics and Gynecology

## 2017-07-13 NOTE — Telephone Encounter (Signed)
Spoke with patient regarding benefit for recommended ultrasound. Patient understood and agreeable. In reviewing ultrasound schedule for 07/16/16, there is no availability with Dr Elza Rafter schedule. Patient advises she is unable to schedule the following week, 07/23/17, due to her spouse have "knee surgery" and having to care for him.  Advised patient we will review Dr Quincy Simmonds, to advise if appropriate to schedule 07/30/17. Patient is agreeable to a return call for scheduling.  Routing to Dr Quincy Simmonds for review  cc: Triage Nurse

## 2017-07-13 NOTE — Telephone Encounter (Signed)
Ok to wait until 07/30/17 for the ultrasound.  If her pain becomes significant, she can have the ultrasound done at the hospital sooner.

## 2017-07-14 NOTE — Telephone Encounter (Signed)
Call placed to patient to convey information, as stated in previous message form Dr Quincy Simmonds.  Patient states she has discussed with her husband and states she is able to schedule on 07/23/17. Reviewed benefits again for recommended ultrasound. Patient understood and agreeable. Patient scheduled 07/23/17 with Dr Quincy Simmonds. Patient is aware of appointment date, arrival time and cancellation policy. I have advised patient to contact our office if her pain becomes significant. Ok to close encounter  Routing to Dr Quincy Simmonds for final review

## 2017-07-20 ENCOUNTER — Encounter: Payer: BC Managed Care – PPO | Attending: Obstetrics and Gynecology | Admitting: Nutrition

## 2017-07-20 VITALS — Ht 65.0 in | Wt 221.0 lb

## 2017-07-20 DIAGNOSIS — R635 Abnormal weight gain: Secondary | ICD-10-CM | POA: Diagnosis not present

## 2017-07-20 DIAGNOSIS — Z713 Dietary counseling and surveillance: Secondary | ICD-10-CM | POA: Diagnosis not present

## 2017-07-20 DIAGNOSIS — Z6836 Body mass index (BMI) 36.0-36.9, adult: Secondary | ICD-10-CM | POA: Diagnosis not present

## 2017-07-20 DIAGNOSIS — E669 Obesity, unspecified: Secondary | ICD-10-CM

## 2017-07-20 DIAGNOSIS — E119 Type 2 diabetes mellitus without complications: Secondary | ICD-10-CM

## 2017-07-20 NOTE — Patient Instructions (Addendum)
Goals 1. Follow MY Plate 2. Eat 2-3 carb choices per meal 3. Increase fresh fruits and lower carb vegetables. 4. Exercise 30 minutes a day 5. Lose 1-2 lbs per week. Keep a food journal Measure foods out. Get A1C to 5.7% or less.

## 2017-07-20 NOTE — Progress Notes (Signed)
  Medical Nutrition Therapy:  Appt start time: 1100 end time:  1200.   Assessment:  Primary concerns today. Obesity and Type 2 DM. LIves with  Her husband.. She does the cooking and shopping. Eats 3 meals per day. Most foods are baked and grilled. Admits to being a stress eater and having to care for family members. Family history of CVD. 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 Has a abdominal hernia that she will have surgery for soon.     Current diet exceeds her needs causing weight gain and elevated blood sugars.  Labs: 06/2017)  TG elevated 214 mg/dl, A1C 6%,  Meds: Metformin 500 mg a day.  She is on fish oils.   Preferred Learning Style:   No preference indicated   Learning Readiness:   Ready  Change in progress   MEDICATIONS: see list   DIETARY INTAKE  24-hr recall:  B ( AM): Cherrios, 1/2 c, milk, 2 cups coffee  Snk ( AM):  L ( PM): 1/2 pb/jelly sandwich, apple and water  Snk ( PM): D ( PM): Didn't feel good yesterday, soup/sandwich,  Snk ( PM): misc cookies or chips sometimes.  Beverages: water  Usual physical activity: ADL  Estimated energy needs: 1500  calories 170 g carbohydrates 112 g protein 42 g fat  Progress Towards Goal(s):  In progress.   Nutritional Diagnosis:  NB-1.1 Food and nutrition-related knowledge deficit As related to Obesity and DM Type 2.  As evidenced by BMI 36  and A1C 6%.    Intervention:  Nutrition and Diabetes education provided on My Plate, CHO counting, meal planning, portion sizes, timing of meals, avoiding snacks between meals unless having a low blood sugar, target ranges for A1C and blood sugars, signs/symptoms and treatment of hyper/hypoglycemia, monitoring blood sugars, taking medications as prescribed, benefits of exercising 30 minutes per day and prevention of complications of DM.  Goals 1. Follow MY Plate 2. Eat 2-3 carb choices per meal 3. Increase  fresh fruits and lower carb vegetables. 4. Exercise 30 minutes a day 5. Lose 1-2 lbs per week. Keep a food journal Measure foods out. Get A1C to 5.7% or less.   Teaching Method Utilized:  Visual Auditory Hands on  Handouts given during visit include:  The Plate Method   Meal Plan Card    Barriers to learning/adherence to lifestyle change: back issues for exercising  Demonstrated degree of understanding via:  Teach Back   Monitoring/Evaluation:  Dietary intake, exercise, meal planning, and body weight in 1 month(s).

## 2017-07-22 ENCOUNTER — Encounter: Payer: Self-pay | Admitting: Nutrition

## 2017-07-23 ENCOUNTER — Ambulatory Visit: Payer: BC Managed Care – PPO | Admitting: Obstetrics and Gynecology

## 2017-07-23 ENCOUNTER — Ambulatory Visit (INDEPENDENT_AMBULATORY_CARE_PROVIDER_SITE_OTHER): Payer: BC Managed Care – PPO

## 2017-07-23 ENCOUNTER — Other Ambulatory Visit: Payer: Self-pay

## 2017-07-23 ENCOUNTER — Encounter: Payer: Self-pay | Admitting: Obstetrics and Gynecology

## 2017-07-23 VITALS — BP 122/72 | HR 68 | Resp 16 | Wt 221.0 lb

## 2017-07-23 DIAGNOSIS — N83201 Unspecified ovarian cyst, right side: Secondary | ICD-10-CM | POA: Diagnosis not present

## 2017-07-23 DIAGNOSIS — K432 Incisional hernia without obstruction or gangrene: Secondary | ICD-10-CM | POA: Diagnosis not present

## 2017-07-23 DIAGNOSIS — K469 Unspecified abdominal hernia without obstruction or gangrene: Secondary | ICD-10-CM

## 2017-07-23 NOTE — Progress Notes (Signed)
Encounter reviewed by Dr. Brook Amundson C. Silva.  

## 2017-07-23 NOTE — Patient Instructions (Signed)
Hernia, Adult A hernia is the bulging of an organ or tissue through a weak spot in the muscles of the abdomen (abdominal wall). Hernias develop most often near the navel or groin. There are many kinds of hernias. Common kinds include:  Femoral hernia. This kind of hernia develops under the groin in the upper thigh area.  Inguinal hernia. This kind of hernia develops in the groin or scrotum.  Umbilical hernia. This kind of hernia develops near the navel.  Hiatal hernia. This kind of hernia causes part of the stomach to be pushed up into the chest.  Incisional hernia. This kind of hernia bulges through a scar from an abdominal surgery.  What are the causes? This condition may be caused by:  Heavy lifting.  Coughing over a long period of time.  Straining to have a bowel movement.  An incision made during an abdominal surgery.  A birth defect (congenital defect).  Excess weight or obesity.  Smoking.  Poor nutrition.  Cystic fibrosis.  Excess fluid in the abdomen.  Undescended testicles.  What are the signs or symptoms? Symptoms of a hernia include:  A lump on the abdomen. This is the first sign of a hernia. The lump may become more obvious with standing, straining, or coughing. It may get bigger over time if it is not treated or if the condition causing it is not treated.  Pain. A hernia is usually painless, but it may become painful over time if treatment is delayed. The pain is usually dull and may get worse with standing or lifting heavy objects.  Sometimes a hernia gets tightly squeezed in the weak spot (strangulated) or stuck there (incarcerated) and causes additional symptoms. These symptoms may include:  Vomiting.  Nausea.  Constipation.  Irritability.  How is this diagnosed? A hernia may be diagnosed with:  A physical exam. During the exam your health care provider may ask you to cough or to make a specific movement, because a hernia is usually more  visible when you move.  Imaging tests. These can include: ? X-rays. ? Ultrasound. ? CT scan.  How is this treated? A hernia that is small and painless may not need to be treated. A hernia that is large or painful may be treated with surgery. Inguinal hernias may be treated with surgery to prevent incarceration or strangulation. Strangulated hernias are always treated with surgery, because lack of blood to the trapped organ or tissue can cause it to die. Surgery to treat a hernia involves pushing the bulge back into place and repairing the weak part of the abdomen. Follow these instructions at home:  Avoid straining.  Do not lift anything heavier than 10 lb (4.5 kg).  Lift with your leg muscles, not your back muscles. This helps avoid strain.  When coughing, try to cough gently.  Prevent constipation. Constipation leads to straining with bowel movements, which can make a hernia worse or cause a hernia repair to break down. You can prevent constipation by: ? Eating a high-fiber diet that includes plenty of fruits and vegetables. ? Drinking enough fluids to keep your urine clear or pale yellow. Aim to drink 6-8 glasses of water per day. ? Using a stool softener as directed by your health care provider.  Lose weight, if you are overweight.  Do not use any tobacco products, including cigarettes, chewing tobacco, or electronic cigarettes. If you need help quitting, ask your health care provider.  Keep all follow-up visits as directed by your health care   provider. This is important. Your health care provider may need to monitor your condition. Contact a health care provider if:  You have swelling, redness, and pain in the affected area.  Your bowel habits change. Get help right away if:  You have a fever.  You have abdominal pain that is getting worse.  You feel nauseous or you vomit.  You cannot push the hernia back in place by gently pressing on it while you are lying  down.  The hernia: ? Changes in shape or size. ? Is stuck outside the abdomen. ? Becomes discolored. ? Feels hard or tender. This information is not intended to replace advice given to you by your health care provider. Make sure you discuss any questions you have with your health care provider. Document Released: 05/12/2005 Document Revised: 10/10/2015 Document Reviewed: 03/22/2014 Elsevier Interactive Patient Education  2017 [employer]. Ovarian Cyst An ovarian cyst is a fluid-filled sac that forms on an ovary. The ovaries are small organs that produce eggs in women. Various types of cysts can form on the ovaries. Some may cause symptoms and require treatment. Most ovarian cysts go away on their own, are not cancerous (are benign), and do not cause problems. Common types of ovarian cysts include:  Functional (follicle) cysts. ? Occur during the menstrual cycle, and usually go away with the next menstrual cycle if you do not get pregnant. ? Usually cause no symptoms.  Endometriomas. ? Are cysts that form from the tissue that lines the uterus (endometrium). ? Are sometimes called "chocolate cysts" because they become filled with blood that turns brown. ? Can cause pain in the lower abdomen during intercourse and during your period.  Cystadenoma cysts. ? Develop from cells on the outside surface of the ovary. ? Can get very large and cause lower abdomen pain and pain with intercourse. ? Can cause severe pain if they twist or break open (rupture).  Dermoid cysts. ? Are sometimes found in both ovaries. ? May contain different kinds of body tissue, such as skin, teeth, hair, or cartilage. ? Usually do not cause symptoms unless they get very big.  Theca lutein cysts. ? Occur when too much of a certain hormone (human chorionic gonadotropin) is produced and overstimulates the ovaries to produce an egg. ? Are most common after having procedures used to assist with the conception of a baby  (in vitro fertilization).  What are the causes? Ovarian cysts may be caused by:  Ovarian hyperstimulation syndrome. This is a condition that can develop from taking fertility medicines. It causes multiple large ovarian cysts to form.  Polycystic ovarian syndrome (PCOS). This is a common hormonal disorder that can cause ovarian cysts, as well as problems with your period or fertility.  What increases the risk? The following factors may make you more likely to develop ovarian cysts:  Being overweight or obese.  Taking fertility medicines.  Taking certain forms of hormonal birth control.  Smoking.  What are the signs or symptoms? Many ovarian cysts do not cause symptoms. If symptoms are present, they may include:  Pelvic pain or pressure.  Pain in the lower abdomen.  Pain during sex.  Abdominal swelling.  Abnormal menstrual periods.  Increasing pain with menstrual periods.  How is this diagnosed? These cysts are commonly found during a routine pelvic exam. You may have tests to find out more about the cyst, such as:  Ultrasound.  X-ray of the pelvis.  CT scan.  MRI.  Blood tests.  How  is this treated? Many ovarian cysts go away on their own without treatment. Your health care provider may want to check your cyst regularly for 2-3 months to see if it changes. If you are in menopause, it is especially important to have your cyst monitored closely because menopausal women have a higher rate of ovarian cancer. When treatment is needed, it may include:  Medicines to help relieve pain.  A procedure to drain the cyst (aspiration).  Surgery to remove the whole cyst.  Hormone treatment or birth control pills. These methods are sometimes used to help dissolve a cyst.  Follow these instructions at home:  Take over-the-counter and prescription medicines only as told by your health care provider.  Do not drive or use heavy machinery while taking prescription pain  medicine.  Get regular pelvic exams and Pap tests as often as told by your health care provider.  Return to your normal activities as told by your health care provider. Ask your health care provider what activities are safe for you.  Do not use any products that contain nicotine or tobacco, such as cigarettes and e-cigarettes. If you need help quitting, ask your health care provider.  Keep all follow-up visits as told by your health care provider. This is important. Contact a health care provider if:  Your periods are late, irregular, or painful, or they stop.  You have pelvic pain that does not go away.  You have pressure on your bladder or trouble emptying your bladder completely.  You have pain during sex.  You have any of the following in your abdomen: ? A feeling of fullness. ? Pressure. ? Discomfort. ? Pain that does not go away. ? Swelling.  You feel generally ill.  You become constipated.  You lose your appetite.  You develop severe acne.  You start to have more body hair and facial hair.  You are gaining weight or losing weight without changing your exercise and eating habits.  You think you may be pregnant. Get help right away if:  You have abdominal pain that is severe or gets worse.  You cannot eat or drink without vomiting.  You suddenly develop a fever.  Your menstrual period is much heavier than usual. This information is not intended to replace advice given to you by your health care provider. Make sure you discuss any questions you have with your health care provider. Document Released: 05/12/2005 Document Revised: 11/30/2015 Document Reviewed: 10/14/2015 Elsevier Interactive Patient Education  [employer].

## 2017-07-23 NOTE — Progress Notes (Signed)
GYNECOLOGY  VISIT   HPI: 62 y.o.   Married  Caucasian  female   G2P2002 with Patient's last menstrual period was 05/26/1993 (within years).   here for  Ultrasound for RLQ pain.   Has abdominal hernia also.   GYNECOLOGIC HISTORY: Patient's last menstrual period was 05/26/1993 (within years). Contraception:  Hysterectomy -- ovaries remain Menopausal hormone therapy:  none Last mammogram:   06/22/17 BIRADS 1 negative/density b Last pap smear:   2009 Negative        OB History    Gravida Para Term Preterm AB Living   2 2 2     2    SAB TAB Ectopic Multiple Live Births                     Patient Active Problem List   Diagnosis Date Noted  . Heart murmur 01/01/2016  . Obesity (BMI 30-39.9) 06/27/2014  . OSA (obstructive sleep apnea) 03/24/2014  . Hyperlipidemia 01/20/2014  . DOE (dyspnea on exertion) 01/20/2014  . Chest pain 01/20/2014  . Acute medial meniscal tear 05/25/2013  . 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 Chronic back pain   . Arthritis, degenerative 09/26/2011  . Encounter for long-term (current) use of other medications 09/26/2011  . Rhinitis 07/05/2011  . HYPERLIPIDEMIA 12/21/2006  . Essential hypertension 12/21/2006  . DIVERTICULOSIS, COLON 12/21/2006    Past Medical History:  Diagnosis Date  . Allergy   . Anxiety    regarding  upcoming breast surgery  . Atypical ductal hyperplasia of left breast   . DDD (degenerative disc disease)   . Diverticulosis of colon   . GERD (gastroesophageal reflux disease)   . Hearing loss   . Hyperlipidemia   . Hypertension   . IBS (irritable bowel syndrome)   . Mild sleep apnea   . Obesity (BMI 30-39.9) 06/27/2014  . OSA on CPAP 06/27/2014  . PONV (postoperative nausea and vomiting)   . Wears glasses     Past Surgical History:  Procedure Laterality Date  . ABDOMINAL HYSTERECTOMY  1994   TVH for DUB--ovaries retained    . ANTERIOR LUMBAR FUSION    . BACK SURGERY  2011   lumb cyst  . BREAST SURGERY     . CERVICAL LAMINECTOMY    . CYST REMOVAL TRUNK     LOWER BACK  . DILATION AND CURETTAGE OF UTERUS    . KNEE ARTHROSCOPY    . KNEE ARTHROSCOPY Right 05/25/2013   Procedure: RIGHT ARTHROSCOPY KNEE, PARTIAL MEDIAL MENISCECTOMY, CHONDROPLASTY, PARTIAL LATERAL MENISCECTOMY;  Surgeon: Gearlean Alf, MD;  Location: [facility];  Service: Orthopedics;  Laterality: Right;  . PARTIAL MASTECTOMY WITH NEEDLE LOCALIZATION Left 06/03/2013   Procedure: PARTIAL MASTECTOMY WITH NEEDLE LOCALIZATION;  Surgeon: [name], MD;  Location: [facility];  Service: General;  Laterality: Left;  . POSTERIOR LUMBAR FUSION    . SINUS IRRIGATION    . TONSILLECTOMY    . TRIGGER FINGER RELEASE    . WISDOM TOOTH EXTRACTION      Current Outpatient Medications  Medication Sig Dispense Refill  . acetaminophen (PAIN RELIEVER EXTRA STRENGTH) 500 MG tablet Take by mouth as needed.     . calcium-vitamin D (OSCAL WITH D) 500-200 MG-UNIT per tablet Take 1 tablet by mouth 2 (two) times daily.    . diclofenac (VOLTAREN) 75 MG EC tablet Take 75 mg by mouth daily.     . fexofenadine (ALLEGRA) 180 MG tablet Take 180 mg by mouth  at bedtime.     . fish oil-omega-3 fatty acids 1000 MG capsule Take 4 g by mouth daily.    . hydrochlorothiazide (HYDRODIURIL) 25 MG tablet TAKE ONE HALF TABLET DAILY.    Marland Kitchen losartan (COZAAR) 25 MG tablet Take 1 tablet (25 mg total) by mouth daily. 90 tablet 3  . meclizine (ANTIVERT) 25 MG tablet Take 25 mg by mouth as needed.     . metFORMIN (GLUCOPHAGE) 500 MG tablet Take 500 mg by mouth daily.    . methylcellulose (CITRUCEL) oral powder Take 1 packet by mouth as needed.     . mometasone (NASONEX) 50 MCG/ACT nasal spray Place into the nose.    . nebivolol (BYSTOLIC) 10 MG tablet Take 10 mg by mouth every morning.    Marland Kitchen omeprazole (PRILOSEC) 20 MG capsule Take 20 mg by mouth as needed.     . rosuvastatin (CRESTOR) 10 MG tablet Take 1 tablet by mouth 3 times a week. 15 tablet 10   No current  facility-administered medications for this visit.      ALLERGIES: Demerol [meperidine]; Penicillins; Sulfa antibiotics; Sulfasalazine; Amlodipine; Amlodipine besylate; Amlodipine besylate; Darvocet [propoxyphene n-acetaminophen]; Hydrochlorothiazide w-triamterene; Lipitor [atorvastatin]; Lisinopril; Meperidine hcl; Propoxyphene; Simvastatin; and Sulfamethoxazole  Family History  Problem Relation Age of Onset  . Heart disease Mother   . Deep vein thrombosis Mother   . Colon cancer Mother   . Rectal cancer Mother   . Lung disease Mother   . Heart disease Father   . COPD Father   . Rheum arthritis Father   . Osteoarthritis Father   . Heart disease Brother   . Pulmonary embolism Brother   . Rheum arthritis Daughter     Social History   Socioeconomic History  . Marital status: Married    Spouse name: Not on file  . Number of children: Not on file  . Years of education: Not on file  . Highest education level: Not on file  Social Needs  . Financial resource strain: Not on file  . Food insecurity - worry: Not on file  . Food insecurity - inability: Not on file  . Transportation needs - medical: Not on file  . Transportation needs - non-medical: Not on file  Occupational History  . Not on file  Tobacco Use  . Smoking status: Never Smoker  . Smokeless tobacco: Never Used  Substance and Sexual Activity  . Alcohol use: No  . Drug use: No  . Sexual activity: Yes    Partners: Male    Comment: TVH  Other Topics Concern  . Not on file  Social History Narrative  . Not on file    ROS:  Pertinent items are noted in HPI.  PHYSICAL EXAMINATION:    BP 122/72 (BP Location: Right Arm, Patient Position: Sitting, Cuff Size: Large)   Pulse 68   Resp 16   Wt 221 lb (100.2 kg)   LMP 05/26/1993 (Within Years)   BMI 36.78 kg/m     General appearance: alert, cooperative and appears stated age   Abdomen: vertical midline incision - 8 - 10 cm hernia to left of umbilicus and incision -  notable with cough. Nontender.   Pelvic US Uterus absent.  Normal vaginal cuff.  Right ovary with small 14 mm cyst and excrescenses, normal blood flow.  Left adnexal 30 x  26 x 28 mm cystic area, possible hydrosalpinx.  Left ovary 8 mm avascular cyst.  Scant free fluid.  ASSESSMENT  Status post TVH. Fibroids  and endometriosis.  RLQ pain.  Right ovary with escrescenses.  Right hydrosalpinx.  Large abdominal wall hernia.    PLAN  Discussion of ovarian cysts.  Will check CA125.  If proceeds with removal of bilateral tubes and ovaries, will have general surgery do a herniorrhaphy.    An After Visit Summary was printed and given to the patient.  __15___ minutes face to face time of which over 50% was spent in counseling.

## 2017-07-24 LAB — CA 125: Cancer Antigen (CA) 125: 9.7 U/mL (ref 0.0–38.1)

## 2017-07-27 ENCOUNTER — Encounter: Payer: Self-pay | Admitting: Obstetrics and Gynecology

## 2017-07-28 ENCOUNTER — Telehealth: Payer: Self-pay | Admitting: Obstetrics and Gynecology

## 2017-07-28 DIAGNOSIS — K432 Incisional hernia without obstruction or gangrene: Secondary | ICD-10-CM

## 2017-07-28 NOTE — Telephone Encounter (Signed)
Spoke with patient. Advised of message as seen below from [place]. Patient verbalizes understanding. Referral placed to Dr.Gross with [facility]. Aware our referrals coordinator will work with [facility] to get this scheduled and that she will be notified of appointment date and time. Note placed in referral for combined surgical case with Dr.Silva.  Cc: Lamont Snowball, RN  Routing to provider for final review. Patient agreeable to disposition. Will close encounter.

## 2017-07-28 NOTE — Telephone Encounter (Signed)
-----   Message from Nunzio Cobbs, MD sent at 07/27/2017  5:26 PM EST ----- Please report result of CA125 which is at a normal level.   I can refer her to a general surgeon to evaluate and plan surgery for her incisional hernia.  We can plan for removal of her tubes and ovaries at the time of this surgery, which would be a combined case with a general surgeon.   If she does not have a Psychologist, sport and exercise in mind in [place], I can refer her to Dr. Johney Maine at [facility].   Cc- Lamont Snowball

## 2017-07-28 NOTE — Telephone Encounter (Signed)
Patient is asking to talk with a nurse about her recent lab results. Patient is also asking to talk about her cyst and hernia.

## 2017-07-30 ENCOUNTER — Telehealth: Payer: Self-pay | Admitting: Obstetrics and Gynecology

## 2017-07-30 NOTE — Telephone Encounter (Signed)
Spoke with patient regarding benefit for surgery. Patient understood and agreeable. Patient has confirmed and is ready to discuss scheduling options. Patient is aware this may be a combined surgery case with Dr Johney Maine. Patient states she has not heard anything from their office for scheduling. Advised referral submitted to [facility] on 07/28/17. I have provided patient with their phone number, she states she will call them to expedite scheduling. Patient aware this is professional benefit for our office and does not include benefits for [employer]. Patient is also aware once surgery has been scheduled she will be contacted by hospital for separate benefits. Forwarding to Conservation officer, historic buildings for scheduling.  Routing to Lamont Snowball, RN

## 2017-07-31 NOTE — Telephone Encounter (Addendum)
Call to patient regarding date options for surgrey. Patient is scheduled for consult with [facility] on 08-11-17.  Advised will work with their office to coordinate surgery date.  Patient can call back following consult with Dr Johney Maine to provide update, if desires.   Routing to provider for final review. Patient agreeable to disposition. Will close encounter.

## 2017-07-31 NOTE — Telephone Encounter (Signed)
Call to [facility] at [facility]. Left message regarding plan for combined surgery case including Lap BSO. Requested call back once patient see in their office.

## 2017-08-11 ENCOUNTER — Encounter: Payer: Self-pay | Admitting: Surgery

## 2017-08-11 ENCOUNTER — Telehealth: Payer: Self-pay | Admitting: Obstetrics and Gynecology

## 2017-08-11 ENCOUNTER — Ambulatory Visit: Payer: Self-pay | Admitting: Surgery

## 2017-08-11 NOTE — H&P (Signed)
Ann Fernandez Documented: 08/11/2017 9:34 AM Location: [facility] Patient #: [id number] DOB: 1955/07/06 Married / Language: Ann Fernandez / Race: White Female  History of Present Illness Ann Hector MD; 08/11/2017 10:22 AM) The patient is a 62 year old female who presents with an incisional hernia. Note for "Incisional hernia": ` ` ` Patient sent for surgical consultation at the request of Dr. Gala Fernandez  Chief Complaint: Incisional hernia. Request for combined case with Gyn  The patient is a very pleasant woman who has noticed a lump around her belly button since her back surgery last year. She had an anterior lumbar fusion done by Dr. Ernesto Fernandez through the [facility] in [place]. Has noted some increasing discomfort. No nausea or vomiting. She does not smoke. She claims she has prediabetes on metformin. She is obese. She struggled with some chronic coughing for the past 6 months. She'll take care of her grandchildren and has moderate intense activity. Gastroenterology allow. She was having some lower abdominal discomfort. Her gynecologist and worked her up and the patient has a right complex ovarian cyst concerning. Surgery recommended. Because she had an incisional hernia, surgical consultation requested to see if this could be repaired at the same time. Patient tries to exercise regularly. No cardiopulmonary issues. The struggle with some hip and knee arthropathies. Had one be replaced. She does have some irregular bowels and claims that she's had irritable bowel syndrome. She takes Citrucel every day and that is helped along with high fiber for vegetable diet. She has a family history of rectal cancer. Its endoscopies every 5 years. She is due for 5 year follow-up through the [facility] gastroenterology. Dr. Zella Fernandez and Ann Fernandez in our group did surgery on her mom so she prefers to be with our group if possible.  (Review of systems as stated in this  history (HPI) or in the review of systems. Otherwise all other 12 point ROS are negative) ` ` `   Past Surgical History Ann Fernandez; 08/11/2017 9:35 AM) Breast Biopsy Left. Hysterectomy (not due to cancer) - Partial Knee Surgery Right. Oral Surgery Spinal Surgery - Lower Back Spinal Surgery - Neck Tonsillectomy  Diagnostic Studies History Ann Fernandez; 08/11/2017 9:35 AM) Colonoscopy 1-5 years ago Mammogram within last year Pap Smear 1-5 years ago  Allergies Ann Fernandez; 08/11/2017 9:35 AM) Penicillins Sulfa Drugs Allergies Reconciled  Medication History Ann Fernandez; 06/03/3233 5:73 AM) Ann Fernandez (10MG  Tablet, Oral) Active. Diclofenac Sodium (75MG  Tablet DR, Oral) Active. HydroCHLOROthiazide (25MG  Tablet, Oral) Active. Losartan Potassium (50MG  Tablet, Oral) Active. Rosuvastatin Calcium (10MG  Tablet, Oral) Active. MetFORMIN HCl (500MG  Tablet, Oral) Active. Mometasone Furoate (50MCG/ACT Suspension, Nasal) Active. Fish Oil (Oral) Specific strength unknown - Active. Medications Reconciled  Social History Ann Fernandez; 08/11/2017 9:35 AM) Caffeine use Coffee, Tea. No alcohol use No drug use Tobacco use Never smoker.  Family History Ann Fernandez; 08/11/2017 9:35 AM) Arthritis Daughter, Father, Mother. Bleeding disorder Daughter. Cancer Family Members In General. Cerebrovascular Accident Family Members In [place] Mother. Colon Polyps Father. Diabetes Mellitus Father, Mother. Heart Disease Brother, Father, Mother. Heart disease in female family member before age 60 Heart disease in female family member before age 33 Hypertension Brother, Father. Malignant Neoplasm Of Pancreas Family Members In General. Rectal Cancer Mother. Respiratory Condition Mother. Thyroid problems Daughter, Family Members In General, Mother.  Pregnancy / Birth History Ann Fernandez; 08/11/2017 9:35 AM) Age at menarche 31  years. Age of menopause 64-55 Gravida 2 Maternal age 33-20 Para 2  Other Problems Ann Dy  Fernandez; 08/11/2017 9:35 AM) Arthritis Back Pain Diverticulosis Gastroesophageal Reflux Disease General anesthesia - complications High blood pressure Hypercholesterolemia Sleep Apnea     Review of Systems Ann Fernandez; 08/11/2017 9:35 AM) General Not Present- Appetite Loss, Chills, Fatigue, Fever, Night Sweats, Weight Gain and Weight Loss. Skin Present- Dryness. Not Present- Change in Wart/Mole, Hives, Jaundice, New Lesions, Non-Healing Wounds, Rash and Ulcer. HEENT Present- Hearing Loss, Seasonal Allergies and Wears glasses/contact lenses. Not Present- Earache, Hoarseness, Nose Bleed, Oral Ulcers, Ringing in the Ears, Sinus Pain, Sore Throat, Visual Disturbances and Yellow Eyes. Respiratory Not Present- Bloody sputum, Chronic Cough, Difficulty Breathing, Snoring and Wheezing. Breast Not Present- Breast Mass, Breast Pain, Nipple Discharge and Skin Changes. Cardiovascular Not Present- Chest Pain, Difficulty Breathing Lying Down, Leg Cramps, Palpitations, Rapid Heart Rate, Shortness of Breath and Swelling of Extremities. Gastrointestinal Present- Abdominal Pain and Bloating. Not Present- Bloody Stool, Change in Bowel Habits, Chronic diarrhea, Constipation, Difficulty Swallowing, Excessive gas, Gets full quickly at meals, Hemorrhoids, Indigestion, Nausea, Rectal Pain and Vomiting. Female Genitourinary Not Present- Frequency, Nocturia, Painful Urination, Pelvic Pain and Urgency. Musculoskeletal Present- Back Pain and Joint Pain. Not Present- Joint Stiffness, Muscle Pain, Muscle Weakness and Swelling of Extremities. Neurological Not Present- Decreased Memory, Fainting, Headaches, Numbness, Seizures, Tingling, Tremor, Trouble walking and Weakness. Psychiatric Not Present- Anxiety, Bipolar, Change in Sleep Pattern, Depression, Fearful and Frequent crying. Endocrine Not Present- Cold  Intolerance, Excessive Hunger, Hair Changes, Heat Intolerance, Hot flashes and New Diabetes. Hematology Not Present- Blood Thinners, Easy Bruising, Excessive bleeding, Gland problems, HIV and Persistent Infections.  Vitals Ann Fernandez; 08/11/2017 9:36 AM) 08/11/2017 9:36 AM Weight: 221.38 lb Height: 65in Body Surface Area: 2.07 m Body Mass Index: 36.84 kg/m  Temp.: 98.33F(Oral)  Pulse: 69 (Regular)  BP: 184/104 (Sitting, Right Arm, Standard)      Physical Exam Ann Hector MD; 08/11/2017 10:06 AM)  General Mental Status-Alert. General Appearance-Not in acute distress, Not Sickly. Orientation-Oriented X3. Hydration-Well hydrated. Voice-Normal.  Integumentary Global Assessment Upon inspection and palpation of skin surfaces of the - Axillae: non-tender, no inflammation or ulceration, no drainage. and Distribution of scalp and body hair is normal. General Characteristics Temperature - normal warmth is noted.  Head and Neck Head-normocephalic, atraumatic with no lesions or palpable masses. Face Global Assessment - atraumatic, no absence of expression. Neck Global Assessment - no abnormal movements, no bruit auscultated on the right, no bruit auscultated on the left, no decreased range of motion, non-tender. Trachea-midline. Thyroid Gland Characteristics - non-tender.  Eye Eyeball - Left-Extraocular movements intact, No Nystagmus. Eyeball - Right-Extraocular movements intact, No Nystagmus. Cornea - Left-No Hazy. Cornea - Right-No Hazy. Sclera/Conjunctiva - Left-No scleral icterus, No Discharge. Sclera/Conjunctiva - Right-No scleral icterus, No Discharge. Pupil - Left-Direct reaction to light normal. Pupil - Right-Direct reaction to light normal.  ENMT Ears Pinna - Left - no drainage observed, no generalized tenderness observed. Right - no drainage observed, no generalized tenderness observed. Nose and Sinuses External  Inspection of the Nose - no destructive lesion observed. Inspection of the nares - Left - quiet respiration. Right - quiet respiration. Mouth and Throat Lips - Upper Lip - no fissures observed, no pallor noted. Lower Lip - no fissures observed, no pallor noted. Nasopharynx - no discharge present. Oral Cavity/Oropharynx - Tongue - no dryness observed. Oral Mucosa - no cyanosis observed. Hypopharynx - no evidence of airway distress observed.  Chest and Lung Exam Inspection Movements - Normal and Symmetrical. Accessory muscles - No use of accessory muscles in breathing.  Palpation Palpation of the chest reveals - Non-tender. Auscultation Breath sounds - Normal and Clear.  Cardiovascular Auscultation Rhythm - Regular. Murmurs & Other Heart Sounds - Auscultation of the heart reveals - No Murmurs and No Systolic Clicks.  Abdomen Inspection Inspection of the abdomen reveals - No Visible peristalsis and No Abnormal pulsations. Umbilicus - No Bleeding, No Urine drainage. Palpation/Percussion Palpation and Percussion of the abdomen reveal - Soft, Non Tender, No Rebound tenderness, No Rigidity (guarding) and No Cutaneous hyperesthesia. Note: 7 cm bulging periumbilically that reduces down to a smaller fascial defect. Obese. Her midline incision otherwise closed no other obvious hernias. Abdomen soft. Not distended. No distasis recti.   Female Genitourinary Sexual Maturity Tanner 5 - Adult hair pattern. Note: No vaginal bleeding nor discharge  Peripheral Vascular Upper Extremity Inspection - Left - No Cyanotic nailbeds, Not Ischemic. Right - No Cyanotic nailbeds, Not Ischemic.  Neurologic Neurologic evaluation reveals -normal attention span and ability to concentrate, able to name objects and repeat phrases. Appropriate fund of knowledge , normal sensation and normal coordination. Mental Status Affect - not angry, not paranoid. Cranial Nerves-Normal  Bilaterally. Gait-Normal.  Neuropsychiatric Mental status exam performed with findings of-able to articulate well with normal speech/language, rate, volume and coherence, thought content normal with ability to perform basic computations and apply abstract reasoning and no evidence of hallucinations, delusions, obsessions or homicidal/suicidal ideation. Note: Pleasant. Chatty.  Musculoskeletal Global Assessment Spine, Ribs and Pelvis - no instability, subluxation or laxity. Right Upper Extremity - no instability, subluxation or laxity.  Lymphatic Head & Neck  General Head & Neck Lymphatics: Bilateral - Description - No Localized lymphadenopathy. Axillary  General Axillary Region: Bilateral - Description - No Localized lymphadenopathy. Femoral & Inguinal  Generalized Femoral & Inguinal Lymphatics: Left - Description - No Localized lymphadenopathy. Right - Description - No Localized lymphadenopathy.    Assessment & Plan Ann Hector MD; 08/11/2017 10:14 AM)  INCISIONAL HERNIA, WITHOUT OBSTRUCTION OR GANGRENE (K43.2) Impression: Pleasant woman with obvious periumbilical reducible mass consistent with incisional hernia. Most likely due to combination of her chronic coughing, moderate activity requirements, and obesity.  I think she would benefit from surgical repair. Laparoscopic underlay mesh with primary repair over it.  It is reasonable to coordinate this at the same time of her planned salpingo-oophorectomy with her gynecologist. We will work to coordinate a convenient time.  I did caution the patient that she will have pain and soreness that will require narcotics and a aggressive increase in her bowel regimen to avoid constipation. I would discourage her from trying to take care of her grandkids for the first 2 weeks at least until she is off narcotics and feeling more dependent, especially with the fact that she has to drive to [place] to help take care of them   PREOP -  [place] (Z01.818)  Current Plans You are being scheduled for surgery- Our schedulers will call you.  You should hear from our office's scheduling department within 5 working days about the location, date, and time of surgery. We try to make accommodations for patient's preferences in scheduling surgery, but sometimes the OR schedule or the surgeon's schedule prevents [place] from making those accommodations.  If you have not heard from our office [phone]) in 5 working days, call the office and ask for your surgeon's nurse.  If you have other questions about your diagnosis, plan, or surgery, call the office and ask for your surgeon's nurse.  Written instructions provided The anatomy & physiology of the abdominal wall was discussed. The pathophysiology of hernias was discussed. Natural history risks without surgery including progeressive enlargement, pain, incarceration, & strangulation was discussed. Contributors to complications such as smoking, obesity, diabetes, prior surgery, etc were discussed.  I feel the risks of no intervention will lead to serious problems that outweigh the operative risks; therefore, I recommended surgery to reduce and repair the hernia. I explained laparoscopic techniques with possible need for an open approach. I noted the probable use of mesh to patch and/or buttress the hernia repair  Risks such as bleeding, infection, abscess, need for further treatment, heart attack, death, and other risks were discussed. I noted a good likelihood this will help address the problem. Goals of post-operative recovery were discussed as well. Possibility that this will not correct all symptoms was explained. I stressed the importance of low-impact activity, aggressive pain control, avoiding constipation, & not pushing through pain to minimize risk of post-operative chronic pain or injury. Possibility of  reherniation especially with smoking, obesity, diabetes, immunosuppression, and other health conditions was discussed. We will work to minimize complications.  An educational handout further explaining the pathology & treatment options was given as well. Questions were answered. The patient expresses understanding & wishes to proceed with surgery.  Pt Education - [facility] Hernia Post-Op HCI (Arletha Marschke): discussed with patient and provided information. Pt Education - [facility] Pain Control (Areg Bialas) Pt Education - Pamphlet Given - Laparoscopic Hernia Repair: discussed with patient and provided information. Pt Education - [facility] Mesh education: discussed with patient and provided information.  RIGHT OVARIAN CYST (N83.201) Impression: Atypical right ovarian cyst. Planned salpingo-oophorectomy by her gynecologist, Dr. Quincy Simmonds. Most likely she will do both sides since the patient is postmenopausal already. Request for combined procedure. We will work to coordinate convenient time. From my standpoint, she may need to stay overnight since its a lot of surgery. May be able to go home same day. In general what prefer [facility] but open to [facility] or [facility] if her gynecologist agrees.  Ann Fernandez, M.D., F.A.C.S. Gastrointestinal and Minimally Invasive Surgery [facility] [address] [place], [place] [address] [phone] Main / Paging

## 2017-08-11 NOTE — Telephone Encounter (Signed)
Return call to [place]. Reviewed potential dates for combined surgery case with Dr Johney Maine. Advised will review with Dr Quincy Simmonds and call her back.

## 2017-08-11 NOTE — Telephone Encounter (Signed)
Ann Fernandez at [facility] is calling to schedule surgery coordination case.

## 2017-08-11 NOTE — Telephone Encounter (Signed)
Call to patient. Left message on voice mail (name confirmation)  Providing update that call has been received from [facility] and we are working on date for procedure.

## 2017-08-14 NOTE — Telephone Encounter (Signed)
Return call to [place]. Continue to coordinate date options. Will call back after reviewing with physician.

## 2017-08-14 NOTE — Telephone Encounter (Signed)
Caryl Pina from [facility] called requesting to speak with Gay Filler.

## 2017-08-17 NOTE — Telephone Encounter (Signed)
Call back to [place]. Plan for 10-02-17. She will call back to confirm.

## 2017-08-17 NOTE — Telephone Encounter (Signed)
Lap BSO combined with Lap Ventral Hernia Repair by Dr Johney Maine scheduled for 10-02-17 at [place] at [facility]. Confirmed with Caryl Pina at [employer] [facility] [employer] in [place].

## 2017-08-18 ENCOUNTER — Ambulatory Visit: Payer: BC Managed Care – PPO | Admitting: Nutrition

## 2017-08-20 ENCOUNTER — Telehealth: Payer: Self-pay | Admitting: Nutrition

## 2017-08-20 NOTE — Telephone Encounter (Signed)
VM left to call if wanted to reschedule canceled appt.

## 2017-09-03 NOTE — Telephone Encounter (Signed)
I would like to have a repeat ultrasound prior to surgery. Please assist in coordination of this.   I am highlighting this message just because it was already closed.

## 2017-09-03 NOTE — Telephone Encounter (Signed)
Call to patient. Surgery instruction sheet reviewed and printed copy will be provided at consult appointment on 09-14-17.   Routing to provider for final review. Patient agreeable to disposition. Will close encounter.

## 2017-09-04 ENCOUNTER — Telehealth: Payer: Self-pay | Admitting: *Deleted

## 2017-09-04 DIAGNOSIS — N83201 Unspecified ovarian cyst, right side: Secondary | ICD-10-CM

## 2017-09-04 NOTE — Telephone Encounter (Signed)
See next phone encounter for ultrasound scheduling information.

## 2017-09-04 NOTE — Telephone Encounter (Signed)
Call to patient. Left message to call back.  Dr Quincy Simmonds reviewed previous phone encounter with surgical instructions and has ordered pelvic ultrasound prior to surgery.  Call to patient to advise and schedule.

## 2017-09-07 ENCOUNTER — Encounter: Payer: Self-pay | Admitting: Gastroenterology

## 2017-09-08 NOTE — Telephone Encounter (Signed)
Patient spoke to [place] on 09-07-17. Ultrasound is scheduled for 09-17-17.

## 2017-09-08 NOTE — Telephone Encounter (Signed)
Call from patient. Ultrasound appointment moved up to 09-10-17 to be prior to pre-op appointment. Patient agreeable.   Routing to provider for final review. Patient agreeable to disposition. Will close encounter.

## 2017-09-10 ENCOUNTER — Other Ambulatory Visit: Payer: Self-pay | Admitting: Obstetrics and Gynecology

## 2017-09-10 ENCOUNTER — Encounter: Payer: Self-pay | Admitting: Obstetrics and Gynecology

## 2017-09-10 ENCOUNTER — Other Ambulatory Visit: Payer: Self-pay

## 2017-09-10 ENCOUNTER — Ambulatory Visit (INDEPENDENT_AMBULATORY_CARE_PROVIDER_SITE_OTHER): Payer: BC Managed Care – PPO

## 2017-09-10 ENCOUNTER — Ambulatory Visit (INDEPENDENT_AMBULATORY_CARE_PROVIDER_SITE_OTHER): Payer: BC Managed Care – PPO | Admitting: Obstetrics and Gynecology

## 2017-09-10 VITALS — BP 130/74 | HR 64 | Resp 14 | Ht 64.75 in | Wt 221.6 lb

## 2017-09-10 DIAGNOSIS — N83201 Unspecified ovarian cyst, right side: Secondary | ICD-10-CM

## 2017-09-10 DIAGNOSIS — N83202 Unspecified ovarian cyst, left side: Secondary | ICD-10-CM | POA: Diagnosis not present

## 2017-09-10 NOTE — Progress Notes (Signed)
GYNECOLOGY  VISIT   HPI: 62 y.o.   Married  Caucasian  female   G2P2002 with Patient's last menstrual period was 05/26/1993 (within years).   here for ultrasound for a right ovarian cyst recheck.   Prior US 07/23/17 showed right ovary with 2 small cysts 14 mm with finger like excresences and 30 x 26 x 28 mm cystic structure that looked like hydrosalpinx versus cyst.  Lefty ovary with 8 mm cyst.   CA125 9.7 on 07/23/17.  Has an incisional hernia and is having a herniorrhaphy with Dr. Johney Maine on 10/02/17. Plan for laparoscopic bilateral salpingectomy and collection of pelvic washings at the same time.  Takes Metformin.  Hgb A1C - 6 on 07/09/17.   Has sleep apnea.  GYNECOLOGIC HISTORY: Patient's last menstrual period was 05/26/1993 (within years). Contraception:  Hysterectomy -- ovaries remain Menopausal hormone therapy:  none Last mammogram:  06/22/17 BIRADS 1 negative/density b Last pap smear:   2009 Negative        OB History    Gravida  2   Para  2   Term  2   Preterm      AB      Living  2     SAB      TAB      Ectopic      Multiple      Live Births                 Patient Active Problem List   Diagnosis Date Noted  . Heart murmur 01/01/2016  . Obesity (BMI 30-39.9) 06/27/2014  . OSA (obstructive sleep apnea) 03/24/2014  . Hyperlipidemia 01/20/2014  . DOE (dyspnea on exertion) 01/20/2014  . Chest pain 01/20/2014  . Acute medial meniscal tear 05/25/2013  . 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 Chronic back pain   . Arthritis, degenerative 09/26/2011  . Encounter for long-term (current) use of other medications 09/26/2011  . Rhinitis 07/05/2011  . HYPERLIPIDEMIA 12/21/2006  . Essential hypertension 12/21/2006  . DIVERTICULOSIS, COLON 12/21/2006    Past Medical History:  Diagnosis Date  . Allergy   . Anxiety    regarding  upcoming breast surgery  . Atypical ductal hyperplasia of left breast   . DDD (degenerative disc disease)   .  Diverticulosis of colon   . GERD (gastroesophageal reflux disease)   . Hearing loss   . Hyperlipidemia   . Hypertension   . IBS (irritable bowel syndrome)   . Mild sleep apnea   . Obesity (BMI 30-39.9) 06/27/2014  . OSA on CPAP 06/27/2014  . PONV (postoperative nausea and vomiting)   . Wears glasses     Past Surgical History:  Procedure Laterality Date  . ABDOMINAL HYSTERECTOMY  1994   TVH for DUB--ovaries retained    . ANTERIOR LUMBAR FUSION    . ANTERIOR LUMBAR FUSION  2018   [facility] Dr Jinny Sanders.  2 level ALIF at L4-5 and L5-S1  . BACK SURGERY  2011   lumb cyst  . BREAST SURGERY    . CERVICAL LAMINECTOMY    . CYST REMOVAL TRUNK     LOWER BACK  . DILATION AND CURETTAGE OF UTERUS    . KNEE ARTHROSCOPY    . KNEE ARTHROSCOPY Right 05/25/2013   Procedure: RIGHT ARTHROSCOPY KNEE, PARTIAL MEDIAL MENISCECTOMY, CHONDROPLASTY, PARTIAL LATERAL MENISCECTOMY;  Surgeon: Gearlean Alf, MD;  Location: [facility];  Service: Orthopedics;  Laterality: Right;  . PARTIAL MASTECTOMY WITH NEEDLE LOCALIZATION Left 06/03/2013  Procedure: PARTIAL MASTECTOMY WITH NEEDLE LOCALIZATION;  Surgeon: [name], MD;  Location: [place];  Service: General;  Laterality: Left;  . POSTERIOR LUMBAR FUSION    . SINUS IRRIGATION    . TONSILLECTOMY    . TRIGGER FINGER RELEASE    . WISDOM TOOTH EXTRACTION      Current Outpatient Medications  Medication Sig Dispense Refill  . acetaminophen (PAIN RELIEVER EXTRA STRENGTH) 500 MG tablet Take by mouth as needed.     . calcium-vitamin D (OSCAL WITH D) 500-200 MG-UNIT per tablet Take 1 tablet by mouth 2 (two) times daily.    . diclofenac (VOLTAREN) 75 MG EC tablet Take 75 mg by mouth daily.     . fexofenadine (ALLEGRA) 180 MG tablet Take 180 mg by mouth at bedtime.     . fish oil-omega-3 fatty acids 1000 MG capsule Take 4 g by mouth daily.    . hydrochlorothiazide (HYDRODIURIL) 25 MG tablet TAKE ONE HALF TABLET DAILY.    Marland Kitchen losartan (COZAAR) 25 MG tablet  Take 1 tablet (25 mg total) by mouth daily. 90 tablet 3  . meclizine (ANTIVERT) 25 MG tablet Take 25 mg by mouth as needed.     . metFORMIN (GLUCOPHAGE) 500 MG tablet Take 500 mg by mouth daily.    . methylcellulose (CITRUCEL) oral powder Take 1 packet by mouth as needed.     . mometasone (NASONEX) 50 MCG/ACT nasal spray Place into the nose.    . nebivolol (BYSTOLIC) 10 MG tablet Take 10 mg by mouth every morning.    Marland Kitchen omeprazole (PRILOSEC) 20 MG capsule Take 20 mg by mouth as needed.     . rosuvastatin (CRESTOR) 10 MG tablet Take 1 tablet by mouth 3 times a week. 15 tablet 10   No current facility-administered medications for this visit.      ALLERGIES: Demerol [meperidine]; Penicillins; Sulfa antibiotics; Sulfasalazine; Amlodipine; Amlodipine besylate; Amlodipine besylate; Darvocet [propoxyphene n-acetaminophen]; Hydrochlorothiazide w-triamterene; Lipitor [atorvastatin]; Lisinopril; Meperidine hcl; Propoxyphene; Simvastatin; and Sulfamethoxazole  Family History  Problem Relation Age of Onset  . Heart disease Mother   . Deep vein thrombosis Mother   . Colon cancer Mother   . Rectal cancer Mother   . Lung disease Mother   . Heart disease Father   . COPD Father   . Rheum arthritis Father   . Osteoarthritis Father   . Heart disease Brother   . Pulmonary embolism Brother   . Rheum arthritis Daughter     Social History   Socioeconomic History  . Marital status: Married    Spouse name: Not on file  . Number of children: Not on file  . Years of education: Not on file  . Highest education level: Not on file  Occupational History  . Not on file  Social Needs  . Financial resource strain: Not on file  . Food insecurity:    Worry: Not on file    Inability: Not on file  . Transportation needs:    Medical: Not on file    Non-medical: Not on file  Tobacco Use  . Smoking status: Never Smoker  . Smokeless tobacco: Never Used  Substance and Sexual Activity  . Alcohol use: No  .  Drug use: No  . Sexual activity: Yes    Partners: Male    Comment: TVH  Lifestyle  . Physical activity:    Days per week: Not on file    Minutes per session: Not on file  . Stress:  Not on file  Relationships  . Social connections:    Talks on phone: Not on file    Gets together: Not on file    Attends religious service: Not on file    Active member of club or organization: Not on file    Attends meetings of clubs or organizations: Not on file    Relationship status: Not on file  . Intimate partner violence:    Fear of current or ex partner: Not on file    Emotionally abused: Not on file    Physically abused: Not on file    Forced sexual activity: Not on file  Other Topics Concern  . Not on file  Social History Narrative  . Not on file    ROS:  Pertinent items are noted in HPI.  PHYSICAL EXAMINATION:    BP 130/74 (BP Location: Left Arm, Patient Position: Sitting, Cuff Size: Large)   Pulse 64   Resp 14   Ht 5' 4.75" (1.645 m)   Wt 221 lb 9.6 oz (100.5 kg)   LMP 05/26/1993 (Within Years)   BMI 37.16 kg/m     General appearance: alert, cooperative and appears stated age   Pelvic US: Uterus absent.  left ovary with follicles. Right ovary with 2 cystic structures:  One 1.4 x 1.2 cm, other 1.6 x 1.6 with solid areas along the wall.  Separate adnexal cystic area 3.5 x 2.6 cm - possible hydrosalpinx. No free fluid.  ASSESSMENT  Right ovarian complex mass. Status post anterior lumbar fusion with vertical midline abdominal incision.  Umbilical hernia/ventral hernia. FH DVT/PE. Sleep apnea.  PLAN  We discussed the persistent change of the right ovary and recommendation for proceeding with the laparoscopic bilateral salpigo-oophorectomy and collection of pelvic washings.  Due to the hernia, she may need additional laparoscopic incisions such as a left upper quadrant incision for initial placement of the laparoscope.  We discussed the possibility of finding extensive  adhesions from her prior surgery and the need for lysis of adhesions prior to performing the BSO and herniorrhaphy.  We also dicussed possible laparotomy.  She has a preop visit scheduled for next week.  An After Visit Summary was printed and given to the patient.  _25_____ minutes face to face time of which over 50% was spent in counseling.

## 2017-09-12 ENCOUNTER — Encounter: Payer: Self-pay | Admitting: Obstetrics and Gynecology

## 2017-09-12 DIAGNOSIS — N83201 Unspecified ovarian cyst, right side: Secondary | ICD-10-CM

## 2017-09-12 HISTORY — DX: Unspecified ovarian cyst, right side: N83.201

## 2017-09-14 ENCOUNTER — Other Ambulatory Visit: Payer: Self-pay

## 2017-09-14 ENCOUNTER — Ambulatory Visit (INDEPENDENT_AMBULATORY_CARE_PROVIDER_SITE_OTHER): Payer: BC Managed Care – PPO | Admitting: Obstetrics and Gynecology

## 2017-09-14 ENCOUNTER — Encounter: Payer: Self-pay | Admitting: Obstetrics and Gynecology

## 2017-09-14 VITALS — BP 146/78 | HR 72 | Resp 16 | Ht 64.75 in | Wt 221.0 lb

## 2017-09-14 DIAGNOSIS — N83201 Unspecified ovarian cyst, right side: Secondary | ICD-10-CM

## 2017-09-14 NOTE — Progress Notes (Signed)
GYNECOLOGY  VISIT   HPI: 62 y.o.   Married  Caucasian  female   G2P2002 with Patient's last menstrual period was 05/26/1993 (within years).   here for surgery consult. Has a small complex right ovarian cyst and normal CA125.  Cyst has not grown by follow up pelvic ultrasound performed last week.   She will have a concurrent laparoscopic ventral/umbilical herniorrhaphy with Dr. Johney Maine.   No changes in her medical care.   GYNECOLOGIC HISTORY: Patient's last menstrual period was 05/26/1993 (within years). Contraception:  Hysterectomy -- ovaries remain Menopausal hormone therapy:  none Last mammogram:  06/22/17 BIRADS 1 negative/density b Last pap smear:   2009 Negative        OB History    Gravida  2   Para  2   Term  2   Preterm      AB      Living  2     SAB      TAB      Ectopic      Multiple      Live Births                 Patient Active Problem List   Diagnosis Date Noted  . Left ovarian cyst 09/12/2017  . Heart murmur 01/01/2016  . Obesity (BMI 30-39.9) 06/27/2014  . OSA (obstructive sleep apnea) 03/24/2014  . Hyperlipidemia 01/20/2014  . DOE (dyspnea on exertion) 01/20/2014  . Chest pain 01/20/2014  . Acute medial meniscal tear 05/25/2013  . 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 Chronic back pain   . Arthritis, degenerative 09/26/2011  . Encounter for long-term (current) use of other medications 09/26/2011  . Rhinitis 07/05/2011  . HYPERLIPIDEMIA 12/21/2006  . Essential hypertension 12/21/2006  . DIVERTICULOSIS, COLON 12/21/2006    Past Medical History:  Diagnosis Date  . Allergy   . Anxiety    regarding  upcoming breast surgery  . Atypical ductal hyperplasia of left breast   . DDD (degenerative disc disease)   . Diverticulosis of colon   . GERD (gastroesophageal reflux disease)   . Hearing loss   . Hyperlipidemia   . Hypertension   . IBS (irritable bowel syndrome)   . Mild sleep apnea   . Obesity (BMI 30-39.9) 06/27/2014   . OSA on CPAP 06/27/2014  . PONV (postoperative nausea and vomiting)   . Wears glasses     Past Surgical History:  Procedure Laterality Date  . ABDOMINAL HYSTERECTOMY  1994   TVH for DUB--ovaries retained    . ANTERIOR LUMBAR FUSION    . ANTERIOR LUMBAR FUSION  2018   [facility] Dr Jinny Sanders.  2 level ALIF at L4-5 and L5-S1  . BACK SURGERY  2011   lumb cyst  . BREAST SURGERY    . CERVICAL LAMINECTOMY    . CYST REMOVAL TRUNK     LOWER BACK  . DILATION AND CURETTAGE OF UTERUS    . KNEE ARTHROSCOPY    . KNEE ARTHROSCOPY Right 05/25/2013   Procedure: RIGHT ARTHROSCOPY KNEE, PARTIAL MEDIAL MENISCECTOMY, CHONDROPLASTY, PARTIAL LATERAL MENISCECTOMY;  Surgeon: Gearlean Alf, MD;  Location: [facility];  Service: Orthopedics;  Laterality: Right;  . PARTIAL MASTECTOMY WITH NEEDLE LOCALIZATION Left 06/03/2013   Procedure: PARTIAL MASTECTOMY WITH NEEDLE LOCALIZATION;  Surgeon: [name], MD;  Location: [place];  Service: General;  Laterality: Left;  . POSTERIOR LUMBAR FUSION    . SINUS IRRIGATION    . TONSILLECTOMY    .  TRIGGER FINGER RELEASE    . WISDOM TOOTH EXTRACTION      Current Outpatient Medications  Medication Sig Dispense Refill  . acetaminophen (PAIN RELIEVER EXTRA STRENGTH) 500 MG tablet Take by mouth as needed.     . calcium-vitamin D (OSCAL WITH D) 500-200 MG-UNIT per tablet Take 1 tablet by mouth 2 (two) times daily.    . diclofenac (VOLTAREN) 75 MG EC tablet Take 75 mg by mouth daily.     . fexofenadine (ALLEGRA) 180 MG tablet Take 180 mg by mouth at bedtime.     . fish oil-omega-3 fatty acids 1000 MG capsule Take 4 g by mouth daily.    . hydrochlorothiazide (HYDRODIURIL) 25 MG tablet TAKE ONE HALF TABLET DAILY.    Marland Kitchen losartan (COZAAR) 25 MG tablet Take 1 tablet (25 mg total) by mouth daily. 90 tablet 3  . meclizine (ANTIVERT) 25 MG tablet Take 25 mg by mouth as needed.     . metFORMIN (GLUCOPHAGE) 500 MG tablet Take 500 mg by mouth daily.    . methylcellulose  (CITRUCEL) oral powder Take 1 packet by mouth as needed.     . mometasone (NASONEX) 50 MCG/ACT nasal spray Place into the nose.    . nebivolol (BYSTOLIC) 10 MG tablet Take 10 mg by mouth every morning.    Marland Kitchen omeprazole (PRILOSEC) 20 MG capsule Take 20 mg by mouth as needed.     . rosuvastatin (CRESTOR) 10 MG tablet Take 1 tablet by mouth 3 times a week. 15 tablet 10   No current facility-administered medications for this visit.      ALLERGIES: Demerol [meperidine]; Penicillins; Sulfa antibiotics; Sulfasalazine; Amlodipine; Amlodipine besylate; Amlodipine besylate; Darvocet [propoxyphene n-acetaminophen]; Hydrochlorothiazide w-triamterene; Lipitor [atorvastatin]; Lisinopril; Meperidine hcl; Propoxyphene; Simvastatin; and Sulfamethoxazole  Family History  Problem Relation Age of Onset  . Heart disease Mother   . Deep vein thrombosis Mother   . Colon cancer Mother   . Rectal cancer Mother   . Lung disease Mother   . Heart disease Father   . COPD Father   . Rheum arthritis Father   . Osteoarthritis Father   . Heart disease Brother   . Pulmonary embolism Brother   . Rheum arthritis Daughter     Social History   Socioeconomic History  . Marital status: Married    Spouse name: Not on file  . Number of children: Not on file  . Years of education: Not on file  . Highest education level: Not on file  Occupational History  . Not on file  Social Needs  . Financial resource strain: Not on file  . Food insecurity:    Worry: Not on file    Inability: Not on file  . Transportation needs:    Medical: Not on file    Non-medical: Not on file  Tobacco Use  . Smoking status: Never Smoker  . Smokeless tobacco: Never Used  Substance and Sexual Activity  . Alcohol use: No  . Drug use: No  . Sexual activity: Yes    Partners: Male    Comment: TVH  Lifestyle  . Physical activity:    Days per week: Not on file    Minutes per session: Not on file  . Stress: Not on file  Relationships  .  Social connections:    Talks on phone: Not on file    Gets together: Not on file    Attends religious service: Not on file    Active member of club or organization:  Not on file    Attends meetings of clubs or organizations: Not on file    Relationship status: Not on file  . Intimate partner violence:    Fear of current or ex partner: Not on file    Emotionally abused: Not on file    Physically abused: Not on file    Forced sexual activity: Not on file  Other Topics Concern  . Not on file  Social History Narrative  . Not on file    ROS:  Pertinent items are noted in HPI.  PHYSICAL EXAMINATION:    BP (!) 146/78 (BP Location: Right Arm, Patient Position: Sitting, Cuff Size: Normal)   Pulse 72   Resp 16   Ht 5' 4.75" (1.645 m)   Wt 221 lb (100.2 kg)   LMP 05/26/1993 (Within Years)   BMI 37.06 kg/m     General appearance: alert, cooperative and appears stated age Head: Normocephalic, without obvious abnormality, atraumatic Neck: no adenopathy, supple, symmetrical, trachea midline and thyroid normal to inspection and palpation Lungs: clear to auscultation bilaterally Heart: regular rate and rhythm Abdomen: vertical midline incision with 4 cm soft hernia.  Abdomen is soft, non-tender, no masses,  no organomegaly. Extremities: extremities normal, atraumatic, no cyanosis or edema Skin: Skin color, texture, turgor normal. No rashes or lesions Lymph nodes: Cervical, supraclavicular, and axillary nodes normal. No abnormal inguinal nodes palpated Neurologic: Grossly normal  Pelvic: Patient unable to tolerate due to cramp in her right leg.   Chaperone was present for exam.  ASSESSMENT  Complex right ovarian cyst.  Ventral/umbilical hernia.  Voltaren use.   PLAN  Proceed with laparoscopic bilateral salpingo-oophorectomy with collection of pelvic washings.  Risks of laparoscopy include but are not limited to bleeding, infection, damage to surrounding organs, reaction to  anesthesia, pneumonia, DVT, PE, death, need for further surgery including surgical staging if a cancer were to be found, and development of scar tissue related to the surgery itself. We talked about potential adhesive disease and conversion to laparotomy.  Magnesium citrate bowel prep.  Stop NSAIDs 3 days prior to surgery.  Will have preop at hospital.  An After Visit Summary was printed and given to the patient.

## 2017-09-14 NOTE — Patient Instructions (Signed)
Please do a bowel prep with magnesium citrate in preparation for surgery.  Please start this at 10:00 AM the day prior to surgery.  You may then have only clear liquids by mouth cone you start the bowel prep.  Nothing by mouth after midnight.

## 2017-09-16 ENCOUNTER — Encounter: Payer: Self-pay | Admitting: Obstetrics and Gynecology

## 2017-09-17 ENCOUNTER — Other Ambulatory Visit: Payer: BC Managed Care – PPO | Admitting: Obstetrics and Gynecology

## 2017-09-17 ENCOUNTER — Other Ambulatory Visit: Payer: BC Managed Care – PPO

## 2017-09-22 NOTE — Patient Instructions (Addendum)
Ann Fernandez  09/22/2017   Your procedure is scheduled on  Friday 10/02/2017  Report to [facility]   Call this number if you have problems the morning of surgery  :[phone].   OUR ADDRESS IS [place].  WE ARE LOCATED IN [facility].      Please bring CPAP mask,tubing and machine with you the morning of surgery!              Please follow Bowel pre instructions from Dr. Elza Rafter office the day before surgery!                         REMEMBER:  DO NOT EAT FOOD AFTER MIDNIGHT .  NO SOLID FOOD AFTER MIDNIGHT THE NIGHT PRIOR TO SURGERY. NOTHING BY MOUTH EXCEPT CLEAR LIQUIDS UNTIL 3 HOURS PRIOR TO [place] SURGERY. PLEASE FINISH ENSURE DRINK PER SURGEON ORDER 3 HOURS PRIOR TO SCHEDULED SURGERY TIME WHICH NEEDS TO BE COMPLETED AT  0430 am.    CLEAR LIQUID DIET   Foods Allowed                                                                     Foods Excluded  Coffee and tea, regular and decaf                             liquids that you cannot  Plain Jell-O in any flavor                                             see through such as: Fruit ices (not with fruit pulp)                                     milk, soups, orange juice  Iced Popsicles                                    All solid food Carbonated beverages, regular and diet                                    Cranberry, grape and apple juices Sports drinks like Gatorade Lightly seasoned clear broth or consume(fat free) Sugar, honey syrup  Sample Menu Breakfast                                Lunch                                     Supper Cranberry juice  Beef broth                            Chicken broth Jell-O                                     Grape juice                           Apple juice Coffee or tea                        Jell-O                                      Popsicle                                                 Coffee or tea                        Coffee or tea  _____________________________________________________________________     TAKE THESE MEDICATIONS MORNING OF SURGERY WITH A SIP OF WATER:     Bystolic                                DO NOT WEAR JEWERLY, MAKE UP, OR NAIL POLISH,  DO NOT WEAR LOTIONS, POWDERS, PERFUMES OR DEODORANT. DO NOT SHAVE FOR 24 HOURS PRIOR TO DAY OF SURGERY.  CONTACTS, GLASSES, OR DENTURES MAY NOT BE WORN TO SURGERY.                                    [facility] IS NOT RESPONSIBLE  FOR ANY BELONGINGS.                                                                    Marland Kitchen                                                                                                     How to Manage Your Diabetes Before and After Surgery  Why is it important to control my blood sugar before and after surgery? . Improving blood sugar levels before and after surgery helps healing and can limit problems. . A way of improving blood sugar control is eating a healthy diet by: o  Eating  less sugar and carbohydrates o  Increasing activity/exercise o  Talking with your doctor about reaching your blood sugar goals . High blood sugars (greater than 180 mg/dL) can raise your risk of infections and slow your recovery, so you will need to focus on controlling your diabetes during the weeks before surgery. . Make sure that the doctor who takes care of your diabetes knows about your planned surgery including the date and location.  How do I manage my blood sugar before surgery? . Check your blood sugar at least 4 times a day, starting 2 days before surgery, to make sure that the level is not too high or low. o Check your blood sugar the morning of your surgery when you wake up and every 2 hours until you get to the Short Stay unit. . If your blood sugar is less than 70 mg/dL, you will need to treat for low blood sugar: o Do not take insulin. o Treat a low blood sugar (less than 70  mg/dL) with  cup of clear juice (cranberry or apple), 4 glucose tablets, OR glucose gel. o Recheck blood sugar in 15 minutes after treatment (to make sure it is greater than 70 mg/dL). If your blood sugar is not greater than 70 mg/dL on recheck, call [phone] for further instructions. . Report your blood sugar to the short stay nurse when you get to Short Stay.  . If you are admitted to the hospital after surgery: o Your blood sugar will be checked by the staff and you will probably be given insulin after surgery (instead of oral diabetes medicines) to make sure you have good blood sugar levels. o The goal for blood sugar control after surgery is 80-180 mg/dL.   WHAT DO I DO ABOUT MY DIABETES MEDICATION?        The day before surgery, take Metformin as usual.  . Do not take oral diabetes medicines (pills) the morning of surgery.    Remember: Do not eat food or drink liquids :After Midnight.     Take these medicines the morning of surgery with A SIP OF WATER: Bystolic   DO NOT TAKE ANY DIABETIC MEDICATIONS DAY OF YOUR SURGERY                               You may not have any metal on your body including hair pins and              piercings  Do not wear jewelry, make-up, lotions, powders or perfumes, deodorant             Do not wear nail polish.  Do not shave  48 hours prior to surgery.               Do not bring valuables to the hospital. [place].  Contacts, dentures or bridgework may not be worn into surgery.  Leave suitcase in the car. After surgery it may be brought to your room.     Patients discharged the day of surgery will not be allowed to drive home.  Name and phone number of your driver:               Please read over the following fact sheets you were given: _____________________________________________________________________  [place] - Preparing for Surgery Before surgery, you can play an  important role.  Because skin is not sterile, your skin needs to be as free of germs as possible.  You can reduce the number of germs on your skin by washing with CHG (chlorahexidine gluconate) soap before surgery.  CHG is an antiseptic cleaner which kills germs and bonds with the skin to continue killing germs even after washing. Please DO NOT use if you have an allergy to CHG or antibacterial soaps.  If your skin becomes reddened/irritated stop using the CHG and inform your nurse when you arrive at Short Stay. Do not shave (including legs and underarms) for at least 48 hours prior to the first CHG shower.  You may shave your face/neck. Please follow these instructions carefully:  1.  Shower with CHG Soap the night before surgery and the  morning of Surgery.  2.  If you choose to wash your hair, wash your hair first as usual with your  normal  shampoo.  3.  After you shampoo, rinse your hair and body thoroughly to remove the  shampoo.                           4.  Use CHG as you would any other liquid soap.  You can apply chg directly  to the skin and wash                       Gently with a scrungie or clean washcloth.  5.  Apply the CHG Soap to your body ONLY FROM THE NECK DOWN.   Do not use on face/ open                           Wound or open sores. Avoid contact with eyes, ears mouth and genitals (private parts).                       Wash face,  Genitals (private parts) with your normal soap.             6.  Wash thoroughly, paying special attention to the area where your surgery  will be performed.  7.  Thoroughly rinse your body with warm water from the neck down.  8.  DO NOT shower/wash with your normal soap after using and rinsing off  the CHG Soap.                9.  Pat yourself dry with a clean towel.            10.  Wear clean pajamas.            11.  Place clean sheets on your bed the night of your first shower and do not  sleep with pets. Day of Surgery : Do not apply any  lotions/deodorants the morning of surgery.  Please wear clean clothes to the hospital/surgery center.  FAILURE TO FOLLOW THESE INSTRUCTIONS MAY RESULT IN THE CANCELLATION OF YOUR SURGERY PATIENT SIGNATURE_________________________________  NURSE SIGNATURE__________________________________  ________________________________________________________________________   Ann Fernandez  An incentive spirometer is a tool that can help keep your lungs clear and active. This tool measures how well you are filling your lungs with each breath. Taking long deep breaths may help reverse or decrease the chance of developing breathing (pulmonary) problems (especially infection) following:  A long period of  time when you are unable to move or be active. BEFORE THE PROCEDURE   If the spirometer includes an indicator to show your best effort, your nurse or respiratory therapist will set it to a desired goal.  If possible, sit up straight or lean slightly forward. Try not to slouch.  Hold the incentive spirometer in an upright position. INSTRUCTIONS FOR USE  1. Sit on the edge of your bed if possible, or sit up as far as you can in bed or on a chair. 2. Hold the incentive spirometer in an upright position. 3. Breathe out normally. 4. Place the mouthpiece in your mouth and seal your lips tightly around it. 5. Breathe in slowly and as deeply as possible, raising the piston or the ball toward the top of the column. 6. Hold your breath for 3-5 seconds or for as long as possible. Allow the piston or ball to fall to the bottom of the column. 7. Remove the mouthpiece from your mouth and breathe out normally. 8. Rest for a few seconds and repeat Steps 1 through 7 at least 10 times every 1-2 hours when you are awake. Take your time and take a few normal breaths between deep breaths. 9. The spirometer may include an indicator to show your best effort. Use the indicator as a goal to work toward during each  repetition. 10. After each set of 10 deep breaths, practice coughing to be sure your lungs are clear. If you have an incision (the cut made at the time of surgery), support your incision when coughing by placing a pillow or rolled up towels firmly against it. Once you are able to get out of bed, walk around indoors and cough well. You may stop using the incentive spirometer when instructed by your caregiver.  RISKS AND COMPLICATIONS  Take your time so you do not get dizzy or light-headed.  If you are in pain, you may need to take or ask for pain medication before doing incentive spirometry. It is harder to take a deep breath if you are having pain. AFTER USE  Rest and breathe slowly and easily.  It can be helpful to keep track of a log of your progress. Your caregiver can provide you with a simple table to help with this. If you are using the spirometer at home, follow these instructions: [place] IF:   You are having difficultly using the spirometer.  You have trouble using the spirometer as often as instructed.  Your pain medication is not giving enough relief while using the spirometer.  You develop fever of 100.5 F (38.1 C) or higher. SEEK IMMEDIATE MEDICAL CARE IF:   You cough up bloody sputum that had not been present before.  You develop fever of 102 F (38.9 C) or greater.  You develop worsening pain at or near the incision site. MAKE SURE YOU:   Understand these instructions.  Will watch your condition.  Will get help right away if you are not doing well or get worse. Document Released: 09/22/2006 Document Revised: 08/04/2011 Document Reviewed: 11/23/2006 Exit[employer] Patient Information 2014 [employer], [place].   ________________________________________________________________________  WHAT IS A BLOOD TRANSFUSION? Blood Transfusion Information  A transfusion is the replacement of blood or some of its parts. Blood is made up of multiple cells which provide  different functions.  Red blood cells carry oxygen and are used for blood loss replacement.  White blood cells fight against infection.  Platelets control bleeding.  Plasma helps clot blood.  Other blood products are available for specialized needs, such as hemophilia or other clotting disorders. BEFORE THE TRANSFUSION  Who gives blood for transfusions?   Healthy volunteers who are fully evaluated to make sure their blood is safe. This is blood bank blood. Transfusion therapy is the safest it has ever been in the practice of medicine. Before blood is taken from a donor, a complete history is taken to make sure that person has no history of diseases nor engages in risky social behavior (examples are intravenous drug use or sexual activity with multiple partners). The donor's travel history is screened to minimize risk of transmitting infections, such as malaria. The donated blood is tested for signs of infectious diseases, such as HIV and hepatitis. The blood is then tested to be sure it is compatible with you in order to minimize the chance of a transfusion reaction. If you or a relative donates blood, this is often done in anticipation of surgery and is not appropriate for emergency situations. It takes many days to process the donated blood. RISKS AND COMPLICATIONS Although transfusion therapy is very safe and saves many lives, the main dangers of transfusion include:   Getting an infectious disease.  Developing a transfusion reaction. This is an allergic reaction to something in the blood you were given. Every precaution is taken to prevent this. The decision to have a blood transfusion has been considered carefully by your caregiver before blood is given. Blood is not given unless the benefits outweigh the risks. AFTER THE TRANSFUSION  Right after receiving a blood transfusion, you will usually feel much better and more energetic. This is especially true if your red blood cells have  gotten low (anemic). The transfusion raises the level of the red blood cells which carry oxygen, and this usually causes an energy increase.  The nurse administering the transfusion will monitor you carefully for complications. HOME CARE INSTRUCTIONS  No special instructions are needed after a transfusion. You may find your energy is better. Speak with your caregiver about any limitations on activity for underlying diseases you may have. SEEK MEDICAL CARE IF:   Your condition is not improving after your transfusion.  You develop redness or irritation at the intravenous (IV) site. SEEK IMMEDIATE MEDICAL CARE IF:  Any of the following symptoms occur over the next 12 hours:  Shaking chills.  You have a temperature by mouth above 102 F (38.9 C), not controlled by medicine.  Chest, back, or muscle pain.  People around you feel you are not acting correctly or are confused.  Shortness of breath or difficulty breathing.  Dizziness and fainting.  You get a rash or develop hives.  You have a decrease in urine output.  Your urine turns a dark color or changes to pink, red, or brown. Any of the following symptoms occur over the next 10 days:  You have a temperature by mouth above 102 F (38.9 C), not controlled by medicine.  Shortness of breath.  Weakness after normal activity.  The white part of the eye turns yellow (jaundice).  You have a decrease in the amount of urine or are urinating less often.  Your urine turns a dark color or changes to pink, red, or brown. Document Released: 05/09/2000 Document Revised: 08/04/2011 Document Reviewed: 12/27/2007 [facility] Patient Information 2014 [place], [place].  _______________________________________________________________________

## 2017-09-22 NOTE — Progress Notes (Signed)
07/08/2017- on chart from Dr. Estelle Grumbles and labs-lipid panel,HgA1C,CMP  10/15/2016-noted in Epic- EKG  01/04/2016- noted in Epic-ECHO

## 2017-09-24 ENCOUNTER — Other Ambulatory Visit: Payer: Self-pay

## 2017-09-24 ENCOUNTER — Encounter (HOSPITAL_COMMUNITY)
Admission: RE | Admit: 2017-09-24 | Discharge: 2017-09-24 | Disposition: A | Discharge status: Home / Self Care | Payer: BC Managed Care – PPO | Source: Ambulatory Visit | Attending: Surgery | Admitting: Surgery

## 2017-09-24 ENCOUNTER — Encounter (HOSPITAL_COMMUNITY): Payer: Self-pay

## 2017-09-24 DIAGNOSIS — N83201 Unspecified ovarian cyst, right side: Secondary | ICD-10-CM | POA: Diagnosis not present

## 2017-09-24 DIAGNOSIS — Z0183 Encounter for blood typing: Secondary | ICD-10-CM | POA: Diagnosis not present

## 2017-09-24 DIAGNOSIS — Z01812 Encounter for preprocedural laboratory examination: Secondary | ICD-10-CM | POA: Insufficient documentation

## 2017-09-24 HISTORY — DX: Type 2 diabetes mellitus without complications: E11.9

## 2017-09-24 HISTORY — DX: Family history of other specified conditions: Z84.89

## 2017-09-24 LAB — BASIC METABOLIC PANEL
Anion gap: 11 (ref 5–15)
BUN: 16 mg/dL (ref 6–20)
CALCIUM: 9.8 mg/dL (ref 8.9–10.3)
CO2: 26 mmol/L (ref 22–32)
CREATININE: 0.65 mg/dL (ref 0.44–1.00)
Chloride: 104 mmol/L (ref 101–111)
GFR calc non Af Amer: >60 mL/min (ref >60)
GLUCOSE: 100 mg/dL — AB (ref 65–99)
Potassium: 4.2 mmol/L (ref 3.5–5.1)
Sodium: 141 mmol/L (ref 135–145)

## 2017-09-24 LAB — CBC
HCT: 41.9 % (ref 36.0–46.0)
Hemoglobin: 14.2 g/dL (ref 12.0–15.0)
MCH: 31.3 pg (ref 26.0–34.0)
MCHC: 33.9 g/dL (ref 30.0–36.0)
MCV: 92.3 fL (ref 78.0–100.0)
PLATELETS: 192 10*3/uL (ref 150–400)
RBC: 4.54 MIL/uL (ref 3.87–5.11)
RDW: 13.3 % (ref 11.5–15.5)
WBC: 6.7 10*3/uL (ref 4.0–10.5)

## 2017-09-24 LAB — HEMOGLOBIN A1C
HEMOGLOBIN A1C: 6 % — AB (ref 4.8–5.6)
Mean Plasma Glucose: 125.5 mg/dL

## 2017-09-24 LAB — ABO/RH: ABO/RH(D): A POS

## 2017-09-24 LAB — GLUCOSE, CAPILLARY: GLUCOSE-CAPILLARY: 92 mg/dL (ref 65–99)

## 2017-09-24 NOTE — Progress Notes (Signed)
Consulted Dr. Montez Hageman, [facility] via phone, and reviewed medical history with him as  Patient needed an Anesthesia Consult. Per Dr. Marcell Barlow, patient is cleared for surgery and Anesthesia will see her the morning of surgery.

## 2017-10-01 MED ORDER — BUPIVACAINE LIPOSOME 1.3 % IJ SUSP
20.0000 mL | INTRAMUSCULAR | Status: DC
  Filled 2017-10-01 (×2): qty 20

## 2017-10-01 NOTE — H&P (Signed)
Office Visit   09/14/2017 Connell Silva, Everardo All, MD  Obstetrics and Gynecology   Right ovarian cyst  Dx   Office Visit   ; Referred by Neale Burly, MD  Reason for Visit   Additional Documentation   Vitals:    BP 146/78  (BP Location: Right Arm, Patient Position: Sitting, Cuff Size: Normal)   Pulse 72   Resp 16   Ht 5' 4.75" (1.645 m)   Wt 221 lb (100.2 kg)   LMP 05/26/1993 (Within Years)   BMI 37.06 kg/m   BSA 2.14 m      More Vitals   Flowsheets:    MEWS Score,   Anthropometrics     Encounter Info:    Billing Info,   History,   Allergies,   Detailed Report     All Notes    Progress Notes by Nunzio Cobbs, MD at 09/14/2017 9:30 AM  Author: Nunzio Cobbs, MD Author Type: Physician Filed: 09/16/2017 10:18 PM  Note Status: Signed Cosign: Cosign Not Required Encounter Date: 09/14/2017  Editor: Nunzio Cobbs, MD (Physician)  Prior Versions: 1. Archie Balboa CMA (Certified Psychologist, sport and exercise) at 09/14/2017  9:27 AM - Sign at close encounter    GYNECOLOGY  VISIT   HPI: 62 y.o.   Married  Caucasian  female   G2P2002 with Patient's last menstrual period was 05/26/1993 (within years).   here for surgery consult. Has a small complex right ovarian cyst and normal CA125.  Cyst has not grown by follow up pelvic ultrasound performed last week.    She will have a concurrent laparoscopic ventral/umbilical herniorrhaphy with Dr. Johney Maine.    No changes in her medical care.    GYNECOLOGIC HISTORY: Patient's last menstrual period was 05/26/1993 (within years). Contraception:  Hysterectomy -- ovaries remain Menopausal hormone therapy:  none Last mammogram:  06/22/17 BIRADS 1 negative/density b Last pap smear:   2009 Negative                OB History     Gravida  2   Para  2   Term  2   Preterm      AB      Living  2      SAB      TAB      Ectopic      Multiple      Live Births                        Patient Active Problem List    Diagnosis Date Noted  . Left ovarian cyst 09/12/2017  . Heart murmur 01/01/2016  . Obesity (BMI 30-39.9) 06/27/2014  . OSA (obstructive sleep apnea) 03/24/2014  . Hyperlipidemia 01/20/2014  . DOE (dyspnea on exertion) 01/20/2014  . Chest pain 01/20/2014  . Acute medial meniscal tear 05/25/2013  . 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 Chronic back pain    . Arthritis, degenerative 09/26/2011  . Encounter for long-term (current) use of other medications 09/26/2011  . Rhinitis 07/05/2011  . HYPERLIPIDEMIA 12/21/2006  . Essential hypertension 12/21/2006  . DIVERTICULOSIS, COLON 12/21/2006          Past Medical History:  Diagnosis Date  . Allergy    . Anxiety      regarding  upcoming breast surgery  . Atypical ductal hyperplasia of left breast    .  DDD (degenerative disc disease)    . Diverticulosis of colon    . GERD (gastroesophageal reflux disease)    . Hearing loss    . Hyperlipidemia    . Hypertension    . IBS (irritable bowel syndrome)    . Mild sleep apnea    . Obesity (BMI 30-39.9) 06/27/2014  . OSA on CPAP 06/27/2014  . PONV (postoperative nausea and vomiting)    . Wears glasses             Past Surgical History:  Procedure Laterality Date  . ABDOMINAL HYSTERECTOMY   1994    [facility] for DUB--ovaries retained    . ANTERIOR LUMBAR FUSION      . ANTERIOR LUMBAR FUSION   2018    [facility] Dr Jinny Sanders.  2 level ALIF at L4-5 and L5-S1  . BACK SURGERY   2011    lumb cyst  . BREAST SURGERY      . CERVICAL LAMINECTOMY      . CYST REMOVAL TRUNK        LOWER BACK  . DILATION AND CURETTAGE OF UTERUS      . KNEE ARTHROSCOPY      . KNEE ARTHROSCOPY Right 05/25/2013    Procedure: RIGHT ARTHROSCOPY KNEE, PARTIAL MEDIAL MENISCECTOMY, CHONDROPLASTY, PARTIAL LATERAL MENISCECTOMY;  Surgeon: Gearlean Alf, MD;  Location: [facility];  Service: Orthopedics;  Laterality: Right;  . PARTIAL MASTECTOMY WITH  NEEDLE LOCALIZATION Left 06/03/2013    Procedure: PARTIAL MASTECTOMY WITH NEEDLE LOCALIZATION;  Surgeon: [name], MD;  Location: [place];  Service: General;  Laterality: Left;  . POSTERIOR LUMBAR FUSION      . SINUS IRRIGATION      . TONSILLECTOMY      . TRIGGER FINGER RELEASE      . WISDOM TOOTH EXTRACTION                Current Outpatient Medications  Medication Sig Dispense Refill  . acetaminophen (PAIN RELIEVER EXTRA STRENGTH) 500 MG tablet Take by mouth as needed.       . calcium-vitamin D (OSCAL WITH D) 500-200 MG-UNIT per tablet Take 1 tablet by mouth 2 (two) times daily.      . diclofenac (VOLTAREN) 75 MG EC tablet Take 75 mg by mouth daily.       . fexofenadine (ALLEGRA) 180 MG tablet Take 180 mg by mouth at bedtime.       . fish oil-omega-3 fatty acids 1000 MG capsule Take 4 g by mouth daily.      . hydrochlorothiazide (HYDRODIURIL) 25 MG tablet TAKE ONE HALF TABLET DAILY.      Marland Kitchen losartan (COZAAR) 25 MG tablet Take 1 tablet (25 mg total) by mouth daily. 90 tablet 3  . meclizine (ANTIVERT) 25 MG tablet Take 25 mg by mouth as needed.       . metFORMIN (GLUCOPHAGE) 500 MG tablet Take 500 mg by mouth daily.      . methylcellulose (CITRUCEL) oral powder Take 1 packet by mouth as needed.       . mometasone (NASONEX) 50 MCG/ACT nasal spray Place into the nose.      . nebivolol (BYSTOLIC) 10 MG tablet Take 10 mg by mouth every morning.      Marland Kitchen omeprazole (PRILOSEC) 20 MG capsule Take 20 mg by mouth as needed.       . rosuvastatin (CRESTOR) 10 MG tablet Take 1 tablet by mouth 3 times a week. 15 tablet 10  No current facility-administered medications for this visit.       ALLERGIES: Demerol [meperidine]; Penicillins; Sulfa antibiotics; Sulfasalazine; Amlodipine; Amlodipine besylate; Amlodipine besylate; Darvocet [propoxyphene n-acetaminophen]; Hydrochlorothiazide w-triamterene; Lipitor [atorvastatin]; Lisinopril; Meperidine hcl; Propoxyphene; Simvastatin; and  Sulfamethoxazole        Family History  Problem Relation Age of Onset  . Heart disease Mother    . Deep vein thrombosis Mother    . Colon cancer Mother    . Rectal cancer Mother    . Lung disease Mother    . Heart disease Father    . COPD Father    . Rheum arthritis Father    . Osteoarthritis Father    . Heart disease Brother    . Pulmonary embolism Brother    . Rheum arthritis Daughter        Social History         Socioeconomic History  . Marital status: Married      Spouse name: Not on file  . Number of children: Not on file  . Years of education: Not on file  . Highest education level: Not on file  Occupational History  . Not on file  Social Needs  . Financial resource strain: Not on file  . Food insecurity:      Worry: Not on file      Inability: Not on file  . Transportation needs:      Medical: Not on file      Non-medical: Not on file  Tobacco Use  . Smoking status: Never Smoker  . Smokeless tobacco: Never Used  Substance and Sexual Activity  . Alcohol use: No  . Drug use: No  . Sexual activity: Yes      Partners: Male      Comment: TVH  Lifestyle  . Physical activity:      Days per week: Not on file      Minutes per session: Not on file  . Stress: Not on file  Relationships  . Social connections:      Talks on phone: Not on file      Gets together: Not on file      Attends religious service: Not on file      Active member of club or organization: Not on file      Attends meetings of clubs or organizations: Not on file      Relationship status: Not on file  . Intimate partner violence:      Fear of current or ex partner: Not on file      Emotionally abused: Not on file      Physically abused: Not on file      Forced sexual activity: Not on file  Other Topics Concern  . Not on file  Social History Narrative  . Not on file      ROS:  Pertinent items are noted in HPI.   PHYSICAL EXAMINATION:     BP (!) 146/78 (BP Location: Right Arm,  Patient Position: Sitting, Cuff Size: Normal)   Pulse 72   Resp 16   Ht 5' 4.75" (1.645 m)   Wt 221 lb (100.2 kg)   LMP 05/26/1993 (Within Years)   BMI 37.06 kg/m     General appearance: alert, cooperative and appears stated age Head: Normocephalic, without obvious abnormality, atraumatic Neck: no adenopathy, supple, symmetrical, trachea midline and thyroid normal to inspection and palpation Lungs: clear to auscultation bilaterally Heart: regular rate and rhythm Abdomen: vertical midline incision with 4  cm soft hernia.  Abdomen is soft, non-tender, no masses,  no organomegaly. Extremities: extremities normal, atraumatic, no cyanosis or edema Skin: Skin color, texture, turgor normal. No rashes or lesions Lymph nodes: Cervical, supraclavicular, and axillary nodes normal. No abnormal inguinal nodes palpated Neurologic: Grossly normal   Pelvic: Patient unable to tolerate due to cramp in her right leg.    Chaperone was present for exam.   ASSESSMENT   Complex right ovarian cyst.  Ventral/umbilical hernia.  Voltaren use.    PLAN   Proceed with laparoscopic bilateral salpingo-oophorectomy with collection of pelvic washings.  Risks of laparoscopy include but are not limited to bleeding, infection, damage to surrounding organs, reaction to anesthesia, pneumonia, DVT, PE, death, need for further surgery including surgical staging if a cancer were to be found, and development of scar tissue related to the surgery itself. We talked about potential adhesive disease and conversion to laparotomy.  Magnesium citrate bowel prep.  Stop NSAIDs 3 days prior to surgery.  Will have preop at hospital.   An After Visit Summary was printed and given to the patient.       Patient Instructions by Nunzio Cobbs, MD at 09/14/2017 9:30 AM  Author: Nunzio Cobbs, MD Author Type: Physician Filed: 09/14/2017 10:19 AM  Note Status: Signed Cosign: Cosign Not Required Encounter Date:  09/14/2017  Editor: Nunzio Cobbs, MD (Physician)    Please do a bowel prep with magnesium citrate in preparation for surgery.  Please start this at 10:00 AM the day prior to surgery.  You may then have only clear liquids by mouth cone you start the bowel prep.  Nothing by mouth after midnight.         Instructions      Return for surgery.  Please do a bowel prep with magnesium citrate in preparation for surgery.  Please start this at 10:00 AM the day prior to surgery.  You may then have only clear liquids by mouth cone you start the bowel prep.  Nothing by mouth after midnight.             After Visit Summary (Printed 09/14/2017)  Communications      [facility] Provider CC Chart Rep sent to Neale Burly, MD  Sent 09/16/2017  Media   Electronic signature on 09/14/2017 9:02 AM - Signed   Communication Routing History   Recipient Method Sent by Date Sent  Neale Burly, MD Fax Nunzio Cobbs, MD 09/16/2017  Fax: [phone] Phone: [phone]

## 2017-10-02 ENCOUNTER — Ambulatory Visit (HOSPITAL_BASED_OUTPATIENT_CLINIC_OR_DEPARTMENT_OTHER)
Admission: RE | Admit: 2017-10-02 | Discharge: 2017-10-03 | Disposition: A | Discharge status: Home / Self Care | Payer: BC Managed Care – PPO | Source: Ambulatory Visit | Attending: Surgery | Admitting: Surgery

## 2017-10-02 ENCOUNTER — Other Ambulatory Visit: Payer: Self-pay

## 2017-10-02 ENCOUNTER — Ambulatory Visit (HOSPITAL_BASED_OUTPATIENT_CLINIC_OR_DEPARTMENT_OTHER): Payer: BC Managed Care – PPO | Admitting: Anesthesiology

## 2017-10-02 ENCOUNTER — Encounter (HOSPITAL_BASED_OUTPATIENT_CLINIC_OR_DEPARTMENT_OTHER): Payer: Self-pay | Admitting: *Deleted

## 2017-10-02 ENCOUNTER — Encounter (HOSPITAL_BASED_OUTPATIENT_CLINIC_OR_DEPARTMENT_OTHER)
Admission: RE | Disposition: A | Discharge status: Home / Self Care | Payer: Self-pay | Source: Ambulatory Visit | Attending: Surgery

## 2017-10-02 DIAGNOSIS — E669 Obesity, unspecified: Secondary | ICD-10-CM | POA: Insufficient documentation

## 2017-10-02 DIAGNOSIS — N83201 Unspecified ovarian cyst, right side: Secondary | ICD-10-CM | POA: Diagnosis not present

## 2017-10-02 DIAGNOSIS — K430 Incisional hernia with obstruction, without gangrene: Secondary | ICD-10-CM

## 2017-10-02 DIAGNOSIS — Z791 Long term (current) use of non-steroidal anti-inflammatories (NSAID): Secondary | ICD-10-CM | POA: Insufficient documentation

## 2017-10-02 DIAGNOSIS — N736 Female pelvic peritoneal adhesions (postinfective): Secondary | ICD-10-CM | POA: Insufficient documentation

## 2017-10-02 DIAGNOSIS — E119 Type 2 diabetes mellitus without complications: Secondary | ICD-10-CM | POA: Diagnosis not present

## 2017-10-02 DIAGNOSIS — I10 Essential (primary) hypertension: Secondary | ICD-10-CM | POA: Diagnosis not present

## 2017-10-02 DIAGNOSIS — Z79899 Other long term (current) drug therapy: Secondary | ICD-10-CM | POA: Diagnosis not present

## 2017-10-02 DIAGNOSIS — Z7951 Long term (current) use of inhaled steroids: Secondary | ICD-10-CM | POA: Insufficient documentation

## 2017-10-02 DIAGNOSIS — G4733 Obstructive sleep apnea (adult) (pediatric): Secondary | ICD-10-CM | POA: Diagnosis present

## 2017-10-02 DIAGNOSIS — Z981 Arthrodesis status: Secondary | ICD-10-CM | POA: Insufficient documentation

## 2017-10-02 DIAGNOSIS — K219 Gastro-esophageal reflux disease without esophagitis: Secondary | ICD-10-CM | POA: Insufficient documentation

## 2017-10-02 DIAGNOSIS — J310 Chronic rhinitis: Secondary | ICD-10-CM | POA: Diagnosis not present

## 2017-10-02 DIAGNOSIS — Z7984 Long term (current) use of oral hypoglycemic drugs: Secondary | ICD-10-CM | POA: Diagnosis not present

## 2017-10-02 DIAGNOSIS — E7800 Pure hypercholesterolemia, unspecified: Secondary | ICD-10-CM | POA: Insufficient documentation

## 2017-10-02 DIAGNOSIS — D279 Benign neoplasm of unspecified ovary: Secondary | ICD-10-CM | POA: Insufficient documentation

## 2017-10-02 DIAGNOSIS — Z6836 Body mass index (BMI) 36.0-36.9, adult: Secondary | ICD-10-CM | POA: Insufficient documentation

## 2017-10-02 DIAGNOSIS — E785 Hyperlipidemia, unspecified: Secondary | ICD-10-CM | POA: Insufficient documentation

## 2017-10-02 HISTORY — PX: INSERTION OF MESH: SHX5868

## 2017-10-02 HISTORY — PX: VENTRAL HERNIA REPAIR: SHX424

## 2017-10-02 HISTORY — PX: LAPAROSCOPIC SALPINGO OOPHERECTOMY: SHX5927

## 2017-10-02 HISTORY — DX: Incisional hernia with obstruction, without gangrene: K43.0

## 2017-10-02 LAB — GLUCOSE, CAPILLARY
GLUCOSE-CAPILLARY: 82 mg/dL (ref 65–99)
Glucose-Capillary: 175 mg/dL — ABNORMAL HIGH (ref 65–99)
Glucose-Capillary: 141 mg/dL — ABNORMAL HIGH (ref 65–99)
Glucose-Capillary: 157 mg/dL — ABNORMAL HIGH (ref 65–99)
Glucose-Capillary: 131 mg/dL — ABNORMAL HIGH (ref 65–99)

## 2017-10-02 LAB — TYPE AND SCREEN
ABO/RH(D): A POS
ANTIBODY SCREEN: NEGATIVE

## 2017-10-02 SURGERY — REPAIR, HERNIA, VENTRAL, LAPAROSCOPIC
Anesthesia: General | Site: Abdomen

## 2017-10-02 MED ORDER — HYDROCORTISONE 1 % EX CREA
1.0000 "application " | TOPICAL_CREAM | Freq: Three times a day (TID) | CUTANEOUS | Status: DC | PRN
  Filled 2017-10-02: qty 28

## 2017-10-02 MED ORDER — EPHEDRINE SULFATE 50 MG/ML IJ SOLN
INTRAMUSCULAR | Status: DC | PRN
  Administered 2017-10-02: 5 mg via INTRAVENOUS
  Administered 2017-10-02: 10 mg via INTRAVENOUS

## 2017-10-02 MED ORDER — DICLOFENAC SODIUM 75 MG PO TBEC
75.0000 mg | DELAYED_RELEASE_TABLET | Freq: Every evening | ORAL | Status: DC
  Administered 2017-10-02: 75 mg via ORAL
  Filled 2017-10-02 (×2): qty 1

## 2017-10-02 MED ORDER — OXYCODONE HCL 5 MG PO TABS
5.0000 mg | ORAL_TABLET | Freq: Once | ORAL | Status: DC | PRN
  Filled 2017-10-02: qty 1

## 2017-10-02 MED ORDER — FENTANYL CITRATE (PF) 100 MCG/2ML IJ SOLN
25.0000 ug | INTRAMUSCULAR | Status: DC | PRN
  Filled 2017-10-02: qty 1

## 2017-10-02 MED ORDER — KETAMINE HCL 10 MG/ML IJ SOLN
INTRAMUSCULAR | Status: DC | PRN
  Administered 2017-10-02: 110 mg via INTRAVENOUS

## 2017-10-02 MED ORDER — SODIUM CHLORIDE 0.9 % IV SOLN
250.0000 mL | INTRAVENOUS | Status: DC | PRN
  Filled 2017-10-02: qty 250

## 2017-10-02 MED ORDER — HYDROCORTISONE 2.5 % RE CREA
1.0000 "application " | TOPICAL_CREAM | Freq: Four times a day (QID) | RECTAL | Status: DC | PRN
  Filled 2017-10-02: qty 28.35

## 2017-10-02 MED ORDER — MIDAZOLAM HCL 2 MG/2ML IJ SOLN
INTRAMUSCULAR | Status: AC
  Filled 2017-10-02: qty 2

## 2017-10-02 MED ORDER — SODIUM CHLORIDE 0.9% FLUSH
3.0000 mL | INTRAVENOUS | Status: DC | PRN
  Filled 2017-10-02: qty 3

## 2017-10-02 MED ORDER — DEXAMETHASONE SODIUM PHOSPHATE 10 MG/ML IJ SOLN
INTRAMUSCULAR | Status: AC
  Filled 2017-10-02: qty 1

## 2017-10-02 MED ORDER — PROPOFOL 10 MG/ML IV BOLUS
INTRAVENOUS | Status: DC | PRN
  Administered 2017-10-02: 120 mg via INTRAVENOUS

## 2017-10-02 MED ORDER — GUAIFENESIN-DM 100-10 MG/5ML PO SYRP
10.0000 mL | ORAL_SOLUTION | ORAL | Status: DC | PRN
  Filled 2017-10-02: qty 10

## 2017-10-02 MED ORDER — LIDOCAINE 2% (20 MG/ML) 5 ML SYRINGE
INTRAMUSCULAR | Status: AC
  Filled 2017-10-02: qty 5

## 2017-10-02 MED ORDER — DEXAMETHASONE SODIUM PHOSPHATE 4 MG/ML IJ SOLN: 4.0000 mg | INTRAMUSCULAR | Status: DC

## 2017-10-02 MED ORDER — PROMETHAZINE HCL 25 MG/ML IJ SOLN
6.2500 mg | INTRAMUSCULAR | Status: DC | PRN
  Filled 2017-10-02: qty 1

## 2017-10-02 MED ORDER — GABAPENTIN 300 MG PO CAPS: 300.0000 mg | ORAL_CAPSULE | Freq: Two times a day (BID) | ORAL | 1 refills | Status: DC

## 2017-10-02 MED ORDER — METOPROLOL TARTRATE 5 MG/5ML IV SOLN
5.0000 mg | Freq: Four times a day (QID) | INTRAVENOUS | Status: DC | PRN
  Filled 2017-10-02: qty 5

## 2017-10-02 MED ORDER — FLUTICASONE PROPIONATE 50 MCG/ACT NA SUSP
2.0000 | Freq: Every day | NASAL | Status: DC
  Filled 2017-10-02: qty 16

## 2017-10-02 MED ORDER — ACETAMINOPHEN 500 MG PO TABS
ORAL_TABLET | ORAL | Status: AC
  Filled 2017-10-02: qty 2

## 2017-10-02 MED ORDER — GABAPENTIN 300 MG PO CAPS
300.0000 mg | ORAL_CAPSULE | Freq: Two times a day (BID) | ORAL | Status: DC
  Administered 2017-10-02: 300 mg via ORAL
  Filled 2017-10-02 (×2): qty 1

## 2017-10-02 MED ORDER — MAGIC MOUTHWASH
15.0000 mL | Freq: Four times a day (QID) | ORAL | Status: DC | PRN
  Filled 2017-10-02: qty 15

## 2017-10-02 MED ORDER — METFORMIN HCL 500 MG PO TABS
500.0000 mg | ORAL_TABLET | Freq: Every evening | ORAL | Status: DC
  Administered 2017-10-02: 500 mg via ORAL
  Filled 2017-10-02 (×2): qty 1

## 2017-10-02 MED ORDER — LACTATED RINGERS IV SOLN
INTRAVENOUS | Status: DC
  Administered 2017-10-02 (×2): via INTRAVENOUS
  Filled 2017-10-02: qty 1000

## 2017-10-02 MED ORDER — DEXAMETHASONE SODIUM PHOSPHATE 4 MG/ML IJ SOLN
4.0000 mg | INTRAMUSCULAR | Status: AC
  Administered 2017-10-02: 4 mg via INTRAVENOUS
  Filled 2017-10-02 (×2): qty 1

## 2017-10-02 MED ORDER — STERILE WATER FOR IRRIGATION IR SOLN
Status: DC | PRN
  Administered 2017-10-02: 3000 mL

## 2017-10-02 MED ORDER — ACETAMINOPHEN 500 MG PO TABS
1000.0000 mg | ORAL_TABLET | ORAL | Status: AC
  Administered 2017-10-02: 1000 mg via ORAL
  Filled 2017-10-02: qty 2

## 2017-10-02 MED ORDER — OXYCODONE HCL 5 MG PO TABS
ORAL_TABLET | ORAL | Status: AC
Start: 2017-10-02 — End: ?
  Filled 2017-10-02: qty 1

## 2017-10-02 MED ORDER — EPHEDRINE SULFATE-NACL 50-0.9 MG/10ML-% IV SOSY
PREFILLED_SYRINGE | INTRAVENOUS | Status: DC | PRN
  Administered 2017-10-02: 10 mg via INTRAVENOUS

## 2017-10-02 MED ORDER — NAPROXEN 500 MG PO TABS: 500.0000 mg | ORAL_TABLET | Freq: Two times a day (BID) | ORAL | 1 refills | Status: DC

## 2017-10-02 MED ORDER — LIDOCAINE 2% (20 MG/ML) 5 ML SYRINGE
INTRAMUSCULAR | Status: AC
  Filled 2017-10-02: qty 10

## 2017-10-02 MED ORDER — METHOCARBAMOL 1000 MG/10ML IJ SOLN
1000.0000 mg | Freq: Four times a day (QID) | INTRAVENOUS | Status: DC | PRN
  Filled 2017-10-02: qty 10

## 2017-10-02 MED ORDER — CLINDAMYCIN PHOSPHATE 900 MG/50ML IV SOLN
900.0000 mg | INTRAVENOUS | Status: AC
  Administered 2017-10-02: 300 mg via INTRAVENOUS
  Filled 2017-10-02: qty 50

## 2017-10-02 MED ORDER — ROCURONIUM BROMIDE 10 MG/ML (PF) SYRINGE
PREFILLED_SYRINGE | INTRAVENOUS | Status: AC
  Filled 2017-10-02: qty 5

## 2017-10-02 MED ORDER — KETOROLAC TROMETHAMINE 30 MG/ML IJ SOLN
30.0000 mg | Freq: Once | INTRAMUSCULAR | Status: DC | PRN
  Filled 2017-10-02: qty 1

## 2017-10-02 MED ORDER — OXYCODONE HCL 5 MG/5ML PO SOLN
5.0000 mg | Freq: Once | ORAL | Status: DC | PRN
  Filled 2017-10-02: qty 5

## 2017-10-02 MED ORDER — ONDANSETRON HCL 4 MG/2ML IJ SOLN
4.0000 mg | Freq: Four times a day (QID) | INTRAMUSCULAR | Status: DC | PRN
  Administered 2017-10-02: 4 mg via INTRAVENOUS
  Filled 2017-10-02: qty 2

## 2017-10-02 MED ORDER — PANTOPRAZOLE SODIUM 40 MG PO TBEC
40.0000 mg | DELAYED_RELEASE_TABLET | Freq: Every day | ORAL | Status: DC
  Administered 2017-10-02: 40 mg via ORAL
  Filled 2017-10-02 (×2): qty 1

## 2017-10-02 MED ORDER — PROCHLORPERAZINE EDISYLATE 10 MG/2ML IJ SOLN
5.0000 mg | INTRAMUSCULAR | Status: DC | PRN
  Filled 2017-10-02: qty 2

## 2017-10-02 MED ORDER — SODIUM CHLORIDE 0.9% FLUSH
3.0000 mL | Freq: Two times a day (BID) | INTRAVENOUS | Status: DC
  Filled 2017-10-02: qty 3

## 2017-10-02 MED ORDER — CELECOXIB 200 MG PO CAPS
200.0000 mg | ORAL_CAPSULE | ORAL | Status: DC
  Filled 2017-10-02: qty 1

## 2017-10-02 MED ORDER — LOSARTAN POTASSIUM 25 MG PO TABS
25.0000 mg | ORAL_TABLET | Freq: Every evening | ORAL | Status: DC
  Administered 2017-10-02: 25 mg via ORAL
  Filled 2017-10-02 (×2): qty 1

## 2017-10-02 MED ORDER — GENTAMICIN SULFATE 40 MG/ML IJ SOLN
5.0000 mg/kg | INTRAVENOUS | Status: DC
  Filled 2017-10-02: qty 12.5

## 2017-10-02 MED ORDER — CLINDAMYCIN PHOSPHATE 900 MG/50ML IV SOLN
INTRAVENOUS | Status: AC
  Filled 2017-10-02: qty 50

## 2017-10-02 MED ORDER — PHENOL 1.4 % MT LIQD
1.0000 | OROMUCOSAL | Status: DC | PRN
  Filled 2017-10-02: qty 177

## 2017-10-02 MED ORDER — PSYLLIUM 95 % PO PACK
1.0000 | PACK | Freq: Every day | ORAL | Status: DC
  Administered 2017-10-02: 1 via ORAL
  Filled 2017-10-02 (×2): qty 1

## 2017-10-02 MED ORDER — LORATADINE 10 MG PO TABS
10.0000 mg | ORAL_TABLET | Freq: Every day | ORAL | Status: DC
  Administered 2017-10-02: 10 mg via ORAL
  Filled 2017-10-02 (×2): qty 1

## 2017-10-02 MED ORDER — FENTANYL CITRATE (PF) 100 MCG/2ML IJ SOLN
INTRAMUSCULAR | Status: DC | PRN
  Administered 2017-10-02: 50 ug via INTRAVENOUS
  Administered 2017-10-02: 100 ug via INTRAVENOUS
  Administered 2017-10-02: 50 ug via INTRAVENOUS

## 2017-10-02 MED ORDER — METHOCARBAMOL 500 MG PO TABS
1000.0000 mg | ORAL_TABLET | Freq: Four times a day (QID) | ORAL | Status: DC | PRN
  Filled 2017-10-02: qty 2

## 2017-10-02 MED ORDER — ONDANSETRON HCL 4 MG/2ML IJ SOLN
INTRAMUSCULAR | Status: AC
  Filled 2017-10-02: qty 2

## 2017-10-02 MED ORDER — LIDOCAINE 2% (20 MG/ML) 5 ML SYRINGE
INTRAMUSCULAR | Status: DC | PRN
  Administered 2017-10-02: 1.5 mg/kg/h via INTRAVENOUS

## 2017-10-02 MED ORDER — MIDAZOLAM HCL 5 MG/5ML IJ SOLN
INTRAMUSCULAR | Status: DC | PRN
  Administered 2017-10-02: 2 mg via INTRAVENOUS

## 2017-10-02 MED ORDER — SUGAMMADEX SODIUM 200 MG/2ML IV SOLN
INTRAVENOUS | Status: AC
  Filled 2017-10-02: qty 2

## 2017-10-02 MED ORDER — NEBIVOLOL HCL 10 MG PO TABS
10.0000 mg | ORAL_TABLET | Freq: Every morning | ORAL | Status: DC
  Filled 2017-10-02: qty 1

## 2017-10-02 MED ORDER — CELECOXIB 200 MG PO CAPS
ORAL_CAPSULE | ORAL | Status: AC
  Filled 2017-10-02: qty 1

## 2017-10-02 MED ORDER — PROPOFOL 10 MG/ML IV BOLUS
INTRAVENOUS | Status: AC
  Filled 2017-10-02: qty 40

## 2017-10-02 MED ORDER — OXYCODONE HCL 5 MG PO TABS
ORAL_TABLET | ORAL | Status: AC
  Filled 2017-10-02: qty 1

## 2017-10-02 MED ORDER — OXYCODONE HCL 5 MG PO TABS
5.0000 mg | ORAL_TABLET | ORAL | Status: DC | PRN
  Administered 2017-10-02 – 2017-10-03 (×3): 5 mg via ORAL
  Filled 2017-10-02: qty 2

## 2017-10-02 MED ORDER — KETOTIFEN FUMARATE 0.025 % OP SOLN
1.0000 [drp] | Freq: Every day | OPHTHALMIC | Status: DC | PRN
  Filled 2017-10-02: qty 5

## 2017-10-02 MED ORDER — ACETAMINOPHEN 500 MG PO TABS
1000.0000 mg | ORAL_TABLET | Freq: Three times a day (TID) | ORAL | Status: DC
  Administered 2017-10-02 (×2): 1000 mg via ORAL
  Filled 2017-10-02: qty 2

## 2017-10-02 MED ORDER — SUGAMMADEX SODIUM 200 MG/2ML IV SOLN
INTRAVENOUS | Status: DC | PRN
  Administered 2017-10-02: 200 mg via INTRAVENOUS

## 2017-10-02 MED ORDER — SODIUM CHLORIDE 0.9 % IV SOLN
8.0000 mg | Freq: Four times a day (QID) | INTRAVENOUS | Status: DC | PRN
  Filled 2017-10-02: qty 4

## 2017-10-02 MED ORDER — LIDOCAINE HCL (CARDIAC) PF 100 MG/5ML IV SOSY
PREFILLED_SYRINGE | INTRAVENOUS | Status: DC | PRN
  Administered 2017-10-02: 100 mg via INTRAVENOUS

## 2017-10-02 MED ORDER — BUPIVACAINE HCL (PF) 0.25 % IJ SOLN
INTRAMUSCULAR | Status: DC | PRN
  Administered 2017-10-02: 11 mL

## 2017-10-02 MED ORDER — BUPIVACAINE LIPOSOME 1.3 % IJ SUSP
INTRAMUSCULAR | Status: DC | PRN
  Administered 2017-10-02: 20 mL

## 2017-10-02 MED ORDER — BUPIVACAINE-EPINEPHRINE 0.25% -1:200000 IJ SOLN
INTRAMUSCULAR | Status: DC | PRN
  Administered 2017-10-02: 90 mL

## 2017-10-02 MED ORDER — LACTATED RINGERS IV BOLUS
1000.0000 mL | Freq: Three times a day (TID) | INTRAVENOUS | Status: DC | PRN
  Filled 2017-10-02: qty 1000

## 2017-10-02 MED ORDER — LIP MEDEX EX OINT
1.0000 "application " | TOPICAL_OINTMENT | Freq: Two times a day (BID) | CUTANEOUS | Status: DC
  Filled 2017-10-02: qty 7

## 2017-10-02 MED ORDER — BISACODYL 10 MG RE SUPP
10.0000 mg | Freq: Two times a day (BID) | RECTAL | Status: DC | PRN
Start: 2017-10-02 — End: 2017-10-03
  Filled 2017-10-02: qty 1

## 2017-10-02 MED ORDER — MECLIZINE HCL 25 MG PO TABS
25.0000 mg | ORAL_TABLET | Freq: Three times a day (TID) | ORAL | Status: DC | PRN
  Filled 2017-10-02: qty 1

## 2017-10-02 MED ORDER — ONDANSETRON HCL 4 MG/2ML IJ SOLN
INTRAMUSCULAR | Status: DC | PRN
  Administered 2017-10-02: 4 mg via INTRAVENOUS

## 2017-10-02 MED ORDER — SODIUM CHLORIDE 0.9% FLUSH
3.0000 mL | Freq: Two times a day (BID) | INTRAVENOUS | Status: DC
  Administered 2017-10-02: 3 mL via INTRAVENOUS
  Filled 2017-10-02: qty 3

## 2017-10-02 MED ORDER — GENTAMICIN SULFATE 40 MG/ML IJ SOLN
370.0000 mg | INTRAVENOUS | Status: DC
  Administered 2017-10-02: 370 mg via INTRAVENOUS
  Filled 2017-10-02 (×2): qty 9.25

## 2017-10-02 MED ORDER — MENTHOL 3 MG MT LOZG
1.0000 | LOZENGE | OROMUCOSAL | Status: DC | PRN
  Filled 2017-10-02: qty 9

## 2017-10-02 MED ORDER — ALUM & MAG HYDROXIDE-SIMETH 200-200-20 MG/5ML PO SUSP
30.0000 mL | Freq: Four times a day (QID) | ORAL | Status: DC | PRN
  Filled 2017-10-02: qty 30

## 2017-10-02 MED ORDER — SODIUM CHLORIDE 0.9 % IR SOLN
Status: DC | PRN
  Administered 2017-10-02: 3000 mL

## 2017-10-02 MED ORDER — KETAMINE HCL 10 MG/ML IJ SOLN
INTRAMUSCULAR | Status: AC
  Filled 2017-10-02: qty 1

## 2017-10-02 MED ORDER — OXYCODONE HCL 5 MG PO TABS: 5.0000 mg | ORAL_TABLET | Freq: Four times a day (QID) | ORAL | 0 refills | Status: DC | PRN

## 2017-10-02 MED ORDER — ROCURONIUM BROMIDE 100 MG/10ML IV SOLN
INTRAVENOUS | Status: DC | PRN
  Administered 2017-10-02: 10 mg via INTRAVENOUS
  Administered 2017-10-02: 50 mg via INTRAVENOUS
  Administered 2017-10-02 (×2): 20 mg via INTRAVENOUS
  Administered 2017-10-02: 10 mg via INTRAVENOUS

## 2017-10-02 MED ORDER — FENTANYL CITRATE (PF) 250 MCG/5ML IJ SOLN
INTRAMUSCULAR | Status: AC
  Filled 2017-10-02: qty 5

## 2017-10-02 SURGICAL SUPPLY — 97 items
APPLIER CLIP 5 13 M/L LIGAMAX5 (MISCELLANEOUS)
BAG RETRIEVAL 10 (BASKET)
BAG URINE DRAINAGE (UROLOGICAL SUPPLIES) ×1 IMPLANT
BINDER ABDOMINAL 12 ML 46-62 (SOFTGOODS) IMPLANT
BLADE HEX COATED 2.75 (ELECTRODE) ×1 IMPLANT
BLADE SURG 15 STRL LF DISP TIS (BLADE) IMPLANT
BLADE SURG 15 STRL SS (BLADE) ×1
CABLE HIGH FREQUENCY MONO STRZ (ELECTRODE) ×3 IMPLANT
CANISTER SUCT 3000ML PPV (MISCELLANEOUS) ×6 IMPLANT
CATH FOLEY 2WAY SLVR  5CC 16FR (CATHETERS) ×1
CATH FOLEY 2WAY SLVR 5CC 16FR (CATHETERS) IMPLANT
CHLORAPREP W/TINT 26ML (MISCELLANEOUS) ×3 IMPLANT
CLIP APPLIE 5 13 M/L LIGAMAX5 (MISCELLANEOUS) IMPLANT
CONT SPEC 4OZ CLIKSEAL STRL BL (MISCELLANEOUS) ×1 IMPLANT
DECANTER SPIKE VIAL GLASS SM (MISCELLANEOUS) ×3 IMPLANT
DERMABOND ADVANCED (GAUZE/BANDAGES/DRESSINGS)
DERMABOND ADVANCED .7 DNX12 (GAUZE/BANDAGES/DRESSINGS) IMPLANT
DEVICE SECURE STRAP 25 ABSORB (INSTRUMENTS) ×1 IMPLANT
DEVICE TROCAR PUNCTURE CLOSURE (ENDOMECHANICALS) ×3 IMPLANT
DRAPE LAPAROSCOPIC ABDOMINAL (DRAPES) ×3 IMPLANT
DRAPE WARM FLUID 44X44 (DRAPE) ×3 IMPLANT
DRSG COVADERM PLUS 2X2 (GAUZE/BANDAGES/DRESSINGS) IMPLANT
DRSG OPSITE POSTOP 3X4 (GAUZE/BANDAGES/DRESSINGS) ×2 IMPLANT
DRSG TEGADERM 2-3/8X2-3/4 SM (GAUZE/BANDAGES/DRESSINGS) ×12 IMPLANT
DRSG TEGADERM 4X4.75 (GAUZE/BANDAGES/DRESSINGS) ×4 IMPLANT
DURAPREP 26ML APPLICATOR (WOUND CARE) ×3 IMPLANT
ELECT REM PT RETURN 9FT ADLT (ELECTROSURGICAL) ×3
ELECTRODE REM PT RTRN 9FT ADLT (ELECTROSURGICAL) ×2 IMPLANT
GAUZE SPONGE 4X4 16PLY XRAY LF (GAUZE/BANDAGES/DRESSINGS) ×1 IMPLANT
GLOVE BIO SURGEON STRL SZ 6.5 (GLOVE) ×7 IMPLANT
GLOVE BIO SURGEON STRL SZ7 (GLOVE) ×1 IMPLANT
GLOVE BIOGEL PI IND STRL 7.0 (GLOVE) IMPLANT
GLOVE BIOGEL PI IND STRL 7.5 (GLOVE) IMPLANT
GLOVE BIOGEL PI INDICATOR 7.0 (GLOVE) ×2
GLOVE BIOGEL PI INDICATOR 7.5 (GLOVE) ×8
GLOVE ECLIPSE 8.0 STRL XLNG CF (GLOVE) ×3 IMPLANT
GLOVE INDICATOR 8.0 STRL GRN (GLOVE) ×3 IMPLANT
GOWN STRL REUS W/TWL LRG LVL3 (GOWN DISPOSABLE) ×12 IMPLANT
GOWN STRL REUS W/TWL XL LVL3 (GOWN DISPOSABLE) ×1 IMPLANT
HOLDER FOLEY CATH W/STRAP (MISCELLANEOUS) ×2 IMPLANT
IRRIG SUCT STRYKERFLOW 2 WTIP (MISCELLANEOUS) ×3
IRRIGATION SUCT STRKRFLW 2 WTP (MISCELLANEOUS) ×2 IMPLANT
IV NS IRRIG 3000ML ARTHROMATIC (IV SOLUTION) ×1 IMPLANT
KIT TURNOVER CYSTO (KITS) ×3 IMPLANT
LIGASURE VESSEL 5MM BLUNT TIP (ELECTROSURGICAL) ×1 IMPLANT
MARKER SKIN DUAL TIP RULER LAB (MISCELLANEOUS) ×1 IMPLANT
MESH VENTRALIGHT ST 8X10 (Mesh General) ×1 IMPLANT
NDL SPNL 22GX3.5 QUINCKE BK (NEEDLE) IMPLANT
NEEDLE HYPO 22GX1.5 SAFETY (NEEDLE) ×6 IMPLANT
NEEDLE INSUFFLATION 120MM (ENDOMECHANICALS) ×3 IMPLANT
NEEDLE SPNL 22GX3.5 QUINCKE BK (NEEDLE) ×3 IMPLANT
NS IRRIG 1000ML POUR BTL (IV SOLUTION) ×5 IMPLANT
NS IRRIG 500ML POUR BTL (IV SOLUTION) ×3 IMPLANT
PACK BASIN DAY SURGERY FS (CUSTOM PROCEDURE TRAY) ×3 IMPLANT
PACK LAPAROSCOPY BASIN (CUSTOM PROCEDURE TRAY) ×3 IMPLANT
PACK TRENDGUARD 450 HYBRID PRO (MISCELLANEOUS) IMPLANT
PAD POSITIONING PINK XL (MISCELLANEOUS) ×3 IMPLANT
PENCIL BUTTON HOLSTER BLD 10FT (ELECTRODE) ×1 IMPLANT
POUCH LAPAROSCOPIC INSTRUMENT (MISCELLANEOUS) ×1 IMPLANT
PROTECTOR NERVE ULNAR (MISCELLANEOUS) ×6 IMPLANT
SCISSORS LAP 5X35 DISP (ENDOMECHANICALS) ×3 IMPLANT
SET IRRIG TUBING LAPAROSCOPIC (IRRIGATION / IRRIGATOR) ×3 IMPLANT
SET IRRIG Y TYPE TUR BLADDER L (SET/KITS/TRAYS/PACK) ×1 IMPLANT
SHEARS HARMONIC ACE PLUS 36CM (ENDOMECHANICALS) ×1 IMPLANT
SLEEVE ADV FIXATION 5X100MM (TROCAR) ×1 IMPLANT
SLEEVE SURGEON STRL (DRAPES) ×1 IMPLANT
SPONGE GAUZE 2X2 8PLY STRL LF (GAUZE/BANDAGES/DRESSINGS) ×9 IMPLANT
STRIP CLOSURE SKIN 1/2X4 (GAUZE/BANDAGES/DRESSINGS) ×7 IMPLANT
SUT MNCRL AB 4-0 PS2 18 (SUTURE) ×3 IMPLANT
SUT PDS AB 1 CT1 27 (SUTURE) ×8 IMPLANT
SUT PLAIN 3 0 FS 2 27 (SUTURE) IMPLANT
SUT PROLENE 1 CT 1 30 (SUTURE) ×18 IMPLANT
SUT VIC AB 2-0 SH 18 (SUTURE) ×1 IMPLANT
SUT VIC AB 2-0 SH 27 (SUTURE)
SUT VIC AB 2-0 SH 27X BRD (SUTURE) IMPLANT
SUT VIC AB 3-0 SH 27 (SUTURE) ×1
SUT VIC AB 3-0 SH 27X BRD (SUTURE) ×2 IMPLANT
SUT VIC AB 4-0 SH 27 (SUTURE) ×1
SUT VIC AB 4-0 SH 27XANBCTRL (SUTURE) ×2 IMPLANT
SUT VICRYL 0 UR6 27IN ABS (SUTURE) IMPLANT
SYR 20CC LL (SYRINGE) ×2 IMPLANT
SYS BAG RETRIEVAL 10MM (BASKET)
SYSTEM BAG RETRIEVAL 10MM (BASKET) IMPLANT
TACKER 5MM HERNIA 3.5CML NAB (ENDOMECHANICALS) IMPLANT
TOWEL OR 17X24 6PK STRL BLUE (TOWEL DISPOSABLE) ×9 IMPLANT
TRAY FOLEY CATH SILVER 14FR (SET/KITS/TRAYS/PACK) ×3 IMPLANT
TRAY LAPAROSCOPIC (CUSTOM PROCEDURE TRAY) ×9 IMPLANT
TRENDGUARD 450 HYBRID PRO PACK (MISCELLANEOUS)
TROCAR ADV FIXATION 5X100MM (TROCAR) ×3 IMPLANT
TROCAR BLADELESS OPT 5 100 (ENDOMECHANICALS) ×1 IMPLANT
TROCAR XCEL NON-BLD 11X100MML (ENDOMECHANICALS) ×1 IMPLANT
TROCAR XCEL NON-BLD 5MMX100MML (ENDOMECHANICALS) IMPLANT
TROCAR Z-THREAD FIOS 5X100MM (TROCAR) ×9 IMPLANT
TUBING INSUF HEATED (TUBING) ×6 IMPLANT
WARMER LAPAROSCOPE (MISCELLANEOUS) ×3 IMPLANT
WATER STERILE IRR 3000ML UROMA (IV SOLUTION) ×1 IMPLANT
WATER STERILE IRR 500ML POUR (IV SOLUTION) ×2 IMPLANT

## 2017-10-02 NOTE — Progress Notes (Signed)
Update to History and Physical  No marked change in status since office pre-op visit.   Patient examined.   OK to proceed with surgery. 

## 2017-10-02 NOTE — Progress Notes (Signed)
10/02/2017 7:36 PM Pt. Meeting all discharge criteria set forth in prior orders from Dr. Johney Maine except for tolerating diet. Pt. With emesis x 1 at 1800, nausea resolved with PRN Zofran already ordered. Pt. Voiced concerns that she would like to stay overnight due to concerns of recurring nausea or vomiting. Dr. Johney Maine already gave verbal order early in the day that it was ok for patient to stay overnight. Orders enacted. Will continue to closely monitor patient.  Sadiyah Kangas, Arville Lime

## 2017-10-02 NOTE — Progress Notes (Signed)
RT called to inspect Pt's home CPAP unit.  Pt unit inspected for frayed cords and damages, none were present.  Pt's machine is working properly at this time.  RT to monitor and assess as needed.

## 2017-10-02 NOTE — OR Nursing (Signed)
SEVENTH PORT PATIENTS UMBILICUS , ALSO 16 STAB SITES IN A CIRCULAR AREA AROUND MIDLINE INCISION.

## 2017-10-02 NOTE — Anesthesia Procedure Notes (Signed)
Procedure Name: Intubation Date/Time: 10/02/2017 7:50 AM Performed by: Bonney Aid, CRNA Pre-anesthesia Checklist: Patient identified, Emergency Drugs available, Suction available and Patient being monitored Patient Re-evaluated:Patient Re-evaluated prior to induction Oxygen Delivery Method: Circle system utilized Preoxygenation: Pre-oxygenation with 100% oxygen Induction Type: IV induction Ventilation: Mask ventilation without difficulty Laryngoscope Size: Mac and 3 Grade View: Grade I Tube type: Oral Tube size: 7.5 mm Number of attempts: 1 Airway Equipment and Method: Stylet and Oral airway Placement Confirmation: ETT inserted through vocal cords under direct vision,  positive ETCO2 and breath sounds checked- equal and bilateral Secured at: 21 cm Tube secured with: Tape Dental Injury: Teeth and Oropharynx as per pre-operative assessment  Comments: Cord easily viewed, easy intubation

## 2017-10-02 NOTE — Op Note (Signed)
10/02/2017  PATIENT:  Ann Fernandez  62 y.o. female  Patient Care Team: Neale Burly, MD as PCP - General (Internal Medicine) Michael Boston, MD as Consulting Physician (General Surgery) Yisroel Ramming, Everardo All, MD as Consulting Physician (Obstetrics and Gynecology) Freddie Breech, MD as Referring Physician (Neurosurgery)  PRE-OPERATIVE DIAGNOSIS:  Incisional hernia in abdomen  POST-OPERATIVE DIAGNOSIS:  Incisional hernia in abdomen  PROCEDURE:  Procedure(s): LAPAROSCOPIC VENTRAL WALL ASSISTED  HERNIA REPAIR WITH MESH INSERTION OF MESH LAPAROSCOPIC SALPINGO OOPHORECTOMY, CYSTOSCOPY  Graham:  [name], MD  ASSISTANT: Nurse   ANESTHESIA:     General Local anesthesia BILATERAL TAP & field block: (0.25% bupivacaine & liposomal  Bupivacaine [Experel])  EBL:  Total I/O In: 1000 [I.V.:1000] Out: 100 [Urine:65; Blood:35]  Per anesthesia record  Delay start of Pharmacological VTE agent (>24hrs) due to surgical blood loss or risk of bleeding:  no  DRAINS: none   SPECIMEN:  No Specimen  DISPOSITION OF SPECIMEN:  N/A  COUNTS:  YES  PLAN OF CARE: Discharge to home after PACU  PATIENT DISPOSITION:  PACU - hemodynamically stable.  INDICATION: Pleasant patient has developed a ventral wall abdominal hernia.   Recommendation was made for surgical repair:  The anatomy & physiology of the abdominal wall was discussed. The pathophysiology of hernias was discussed. Natural history risks without surgery including progeressive enlargement, pain, incarceration & strangulation was discussed. Contributors to complications such as smoking, obesity, diabetes, prior surgery, etc were discussed.  I feel the risks of no intervention will lead to serious problems that outweigh the operative risks; therefore, I recommended surgery to reduce and repair the hernia. I explained laparoscopic techniques with possible need for  an open approach. I noted the probable use of mesh to patch and/or buttress the hernia repair  Risks such as bleeding, infection, abscess, need for further treatment, heart attack, death, and other risks were discussed. I noted a good likelihood this will help address the problem. Goals of post-operative recovery were discussed as well. Possibility that this will not correct all symptoms was explained. I stressed the importance of low-impact activity, aggressive pain control, avoiding constipation, & not pushing through pain to minimize risk of post-operative chronic pain or injury. Possibility of reherniation especially with smoking, obesity, diabetes, immunosuppression, and other health conditions was discussed. We will work to minimize complications.  An educational handout further explaining the pathology & treatment options was given as well. Questions were answered. The patient expresses understanding & wishes to proceed with surgery.   OR FINDINGS: Infraumbilical incisional hernia incarcerated with omentum.  8 x 5 cm.  Type of repair: Laparoscopic underlay repair   Placement of mesh: Intraperitoneal underlay repair  Name of mesh: Bard Ventralight dual sided (polypropylene / Seprafilm)  Size of mesh: 25x20cm  Orientation: Transverse  Mesh overlap:  5-7cm   DESCRIPTION:   Informed consent was confirmed. The patient underwent general anaesthesia without difficulty. The patient was positioned appropriately. VTE prevention in place.  Patient had already gone laparoscopic bilateral salpingo-oophorectomy by Dr. Quincy Simmonds.  Please see her operative report.  Patient remained prepped and draped.  Clean Foley catheter placed at the end after she had done cystoscopy to evaluate bladder and ureters.  Surgical timeout confirmed our plan.  Extra ports were carefully placed under direct laparoscopic visualization.   I could see adhesions on the prietal peritoneum under the abdominal wall.  I did  laparoscopic lysis of adhesions to  expose the entire anterior abdominal wall.  I primarily used focused harmonic dissection.  Greater omentum involved.  Reduce down as well as hernia sac transected off.  Freed falciform ligament off the preperitoneal space up to the xiphoid.  I freed off peritoneum infraumbilically down to the pubic rim to better expose the retrorectus space/preperitoneal region.  Measured out the incisional hernia defect.  One dominant defect.   I mapped out the region using a needle passer.   To ensure that I would have at least 5 cm radial coverage outside of the hernia defect, I chose a 25x20cm dual sided mesh.  I placed #1 Prolene stitches around its edge about every 5 cm = 16 total.  I rolled the mesh & placed into the peritoneal cavity through the 10 mm infraumbilical port site just inferior to the incisional hernia defect.  I unrolled the mesh and positioned it appropriately.  I secured the mesh to cover up the hernia defect using a laparoscopic suture passer to pass the tails of the Prolene through the abdominal wall & tagged them with clamps for good transfascial suturing.  I started out in four corners to make sure I had the mesh centered under the hernia defect appropriately, and then proceeded to work in quadrants.    We evacuated CO2 & desufflated the abdomen.  I tied the fascial stitches down.  Then came for a periumbilical vertical incision to expose the hernia and fascia.  Excise some excess hernia sac and omentum.  Mobilized subcutaneous fat of the fascia circumferentially.  The defect seem to come together more easily transversely.  I closed that using running  #1 PDS transverse stitches primarily.  I reinsufflated the abdomen. The mesh provided at least circumferential coverage around the entire region of hernia defects.  I secured the mesh centrally with an additional trans fascial stitch in & out the mesh using #1 PDS under laparoscopic visualization.  Did it in paramedian  bilateral, superior and inferior and central locations x5 (like a diamond with a center stitch).  Uses stitches to help re-tacked some of the peritoneum around the mesh so that the lateral circumference was covered and only the inner central part of the mesh was actually exposed to the peritoneum.  I tacked the edges & central part of the mesh to the peritoneum/posterior rectus fascia with SecureStrap absorbable tacks.   I did reinspection. Hemostasis was good. Mesh laid well. I completed a broad field block of local anesthesia at fascial stitch sites & fascial closure areas.    Capnoperitoneum was evacuated. Ports were removed. The skin was closed with Monocryl at the port sites and Steri-Strips on the fascial stitch puncture sites.  Patient is being extubated to go to the recovery room.  I discussed operative findings, updated the patient's status, discussed probable steps to recovery, and gave postoperative recommendations to the patient's spouse.  Recommendations were made.  Questions were answered.  He expressed understanding & appreciation.  [name], M.D., F.A.C.S. Gastrointestinal and Minimally Invasive Surgery [facility] [address] [place], [place] [address] [phone] Main / Paging  10/02/2017 12:49 PM

## 2017-10-02 NOTE — Anesthesia Preprocedure Evaluation (Signed)
Anesthesia Evaluation  Patient identified by MRN, date of birth, ID band Patient awake    Reviewed: Allergy & Precautions, NPO status , Patient's Chart, lab work & pertinent test results  History of Anesthesia Complications (+) PONV and history of anesthetic complications  Airway Mallampati: I       Dental no notable dental hx. (+) Teeth Intact   Pulmonary sleep apnea and Continuous Positive Airway Pressure Ventilation ,    Pulmonary exam normal breath sounds clear to auscultation       Cardiovascular hypertension, Pt. on medications and Pt. on home beta blockers Normal cardiovascular exam Rhythm:Regular Rate:Normal     Neuro/Psych Anxiety    GI/Hepatic GERD  Medicated and Controlled,  Endo/Other  diabetes, Type 2, Oral Hypoglycemic Agents  Renal/GU   negative genitourinary   Musculoskeletal  (+) Arthritis , Osteoarthritis,    Abdominal (+) + obese,   Peds  Hematology negative hematology ROS (+)   Anesthesia Other Findings   Reproductive/Obstetrics                             Anesthesia Physical Anesthesia Plan  ASA: II  Anesthesia Plan: General   Post-op Pain Management:    Induction: Intravenous  PONV Risk Score and Plan: 4 or greater and Ondansetron, Dexamethasone, Midazolam and Scopolamine patch - Pre-op  Airway Management Planned: Oral ETT  Additional Equipment:   Intra-op Plan:   Post-operative Plan: Extubation in OR  Informed Consent: I have reviewed the patients History and Physical, chart, labs and discussed the procedure including the risks, benefits and alternatives for the proposed anesthesia with the patient or authorized representative who has indicated his/her understanding and acceptance.   Dental advisory given  Plan Discussed with: CRNA and Surgeon  Anesthesia Plan Comments:         Anesthesia Quick Evaluation

## 2017-10-02 NOTE — Discharge Instructions (Signed)
HERNIA REPAIR: POST OP INSTRUCTIONS ° °###################################################################### ° °EAT °Gradually transition to a high fiber diet with a fiber supplement over the next few weeks after discharge.  Start with a pureed / full liquid diet (see below) ° °WALK °Walk an hour a day.  Control your pain to do that.   ° °CONTROL PAIN °Control pain so that you can walk, sleep, tolerate sneezing/coughing, go up/down stairs. ° °HAVE A BOWEL MOVEMENT DAILY °Keep your bowels regular to avoid problems.  OK to try a laxative to override constipation.  OK to use an antidairrheal to slow down diarrhea.  Call if not better after 2 tries ° °CALL IF YOU HAVE PROBLEMS/CONCERNS °Call if you are still struggling despite following these instructions. °Call if you have concerns not answered by these instructions ° °###################################################################### ° ° ° °1. DIET: Follow a light bland diet the first 24 hours after arrival home, such as soup, liquids, crackers, etc.  Be sure to include lots of fluids daily.  Avoid fast food or heavy meals as your are more likely to get nauseated.  Eat a low fat the next few days after surgery. °2. Take your usually prescribed home medications unless otherwise directed. °3. PAIN CONTROL: °a. Pain is best controlled by a usual combination of three different methods TOGETHER: °i. Ice/Heat °ii. Over the counter pain medication °iii. Prescription pain medication °b. Most patients will experience some swelling and bruising around the hernia(s) such as the bellybutton, groins, or old incisions.  Ice packs or heating pads (30-60 minutes up to 6 times a day) will help. Use ice for the first few days to help decrease swelling and bruising, then switch to heat to help relax tight/sore spots and speed recovery.  Some people prefer to use ice alone, heat alone, alternating between ice & heat.  Experiment to what works for you.  Swelling and bruising can take  several weeks to resolve.   °c. It is helpful to take an over-the-counter pain medication regularly for the first few weeks.  Choose one of the following that works best for you: °i. Naproxen (Aleve, etc)  Two 220mg tabs twice a day °ii. Ibuprofen (Advil, etc) Three 200mg tabs four times a day (every meal & bedtime) °iii. Acetaminophen (Tylenol, etc) 325-650mg four times a day (every meal & bedtime) °d. A  prescription for pain medication should be given to you upon discharge.  Take your pain medication as prescribed.  °i. If you are having problems/concerns with the prescription medicine (does not control pain, nausea, vomiting, rash, itching, etc), please call us [phone] to see if we need to switch you to a different pain medicine that will work better for you and/or control your side effect better. °ii. If you need a refill on your pain medication, please contact your pharmacy.  They will contact our office to request authorization. Prescriptions will not be filled after 5 pm or on week-ends. °4. Avoid getting constipated.  Between the surgery and the pain medications, it is common to experience some constipation.  Increasing fluid intake and taking a fiber supplement (such as Metamucil, Citrucel, FiberCon, MiraLax, etc) 1-2 times a day regularly will usually help prevent this problem from occurring.  A mild laxative (prune juice, Milk of Magnesia, MiraLax, etc) should be taken according to package directions if there are no bowel movements after 48 hours.   °5. Wash / shower every day.  You may shower over the dressings as they are waterproof.   °6. Remove   your waterproof bandages & skin tape steristrips 5 days after surgery.  You may leave the incisions open to air.  You may replace a dressing/Band-Aid to cover the incision for comfort if you wish.  Continue to shower over incision(s) after the dressing is off.    7. ACTIVITIES as tolerated:   a. You may resume regular (light) daily activities  beginning the next day--such as daily self-care, walking, climbing stairs--gradually increasing activities as tolerated.  If you can walk 30 minutes without difficulty, it is safe to try more intense activity such as jogging, treadmill, bicycling, low-impact aerobics, swimming, etc. b. Save the most intensive and strenuous activity for last such as sit-ups, heavy lifting, contact sports, etc  Refrain from any heavy lifting or straining until you are off narcotics for pain control.   c. DO NOT PUSH THROUGH PAIN.  Let pain be your guide: If it hurts to do something, don't do it.  Pain is your body warning you to avoid that activity for another week until the pain goes down. d. You may drive when you are no longer taking prescription pain medication, you can comfortably wear a seatbelt, and you can safely maneuver your car and apply brakes. e. Dennis Bast may have sexual intercourse when it is comfortable.  8. FOLLOW UP in our office a. Please call [facility] at [phone] to set up an appointment to see your surgeon in the office for a follow-up appointment approximately 2-3 weeks after your surgery. b. Make sure that you call for this appointment the day you arrive home to insure a convenient appointment time. 9.  IF YOU HAVE DISABILITY OR FAMILY LEAVE FORMS, BRING THEM TO THE OFFICE FOR PROCESSING.  DO NOT GIVE THEM TO YOUR DOCTOR.  WHEN TO CALL us [phone]: 1. Poor pain control 2. Reactions / problems with new medications (rash/itching, nausea, etc)  3. Fever over 101.5 F (38.5 C) 4. Inability to urinate 5. Nausea and/or vomiting 6. Worsening swelling or bruising 7. Continued bleeding from incision. 8. Increased pain, redness, or drainage from the incision   The clinic staff is available to answer your questions during regular business hours (8:30am-5pm).  Please dont hesitate to call and ask to speak to one of our nurses for clinical concerns.   If you have a medical emergency, go to the nearest  emergency room or call 911.  A surgeon from [facility] is always on call at the hospitals in [facility], [place], [place], [place], [place]  [address] ?  [address] [address], [place], [place]   [address] MAIN: [phone] ? TOLL FREE: [phone] ? FAX: [phone] [email or web address]  Ms. Olena Heckle,  I removed both tubes and ovaries and treated some scar tissue in the pelvis.  The ovaries were actually stuck together.  Your surgery went well today.   Josefa Half, MD  Bilateral Salpingo-Oophorectomy, Care After This sheet gives you information about how to care for yourself after your procedure. Your health care provider may also give you more specific instructions. If you have problems or questions, contact your health care provider. What can I expect after the procedure? After the procedure, it is common to have:  Abdominal pain.  Some occasional vaginal bleeding (spotting).  Tiredness.  Symptoms of menopause, such as hot flashes, night sweats, or mood swings.  Follow these instructions at home: Incision care  Keep your incision area and your bandage (dressing) clean and dry.  Follow instructions  from your health care provider about how to take care of your incision. Make sure you: ? Wash your hands with soap and water before you change your dressing. If soap and water are not available, use hand sanitizer. ? Change your dressing as told by your health care provider. ? Leave stitches (sutures), staples, skin glue, or adhesive strips in place. These skin closures may need to stay in place for 2 weeks or longer. If adhesive strip edges start to loosen and curl up, you may trim the loose edges. Do not remove adhesive strips completely unless your health care provider tells you to do that.  Check your incision area every day for signs of infection. Check for: ? Redness, swelling, or pain. ? Fluid or  blood. ? Warmth. ? Pus or a bad smell. Activity  Do not drive or use heavy machinery while taking prescription pain medicine.  Do not drive for 24 hours if you received a medicine to help you relax (sedative) during your procedure.  Take frequent, short walks throughout the day. Rest when you get tired. Ask your health care provider what activities are safe for you.  Avoid activity that requires great effort. Also, avoid heavy lifting. Do not lift anything that is heavier than 10 lbs. (4.5 kg), or the limit that your health care provider tells you, until he or she says that it is safe to do so.  Do not douche, use tampons, or have sex until your health care provider approves. General instructions  To prevent or treat constipation while you are taking prescription pain medicine, your health care provider may recommend that you: ? Drink enough fluid to keep your urine clear or pale yellow. ? Take over-the-counter or prescription medicines. ? Eat foods that are high in fiber, such as fresh fruits and vegetables, whole grains, and beans. ? Limit foods that are high in fat and processed sugars, such as fried and sweet foods.  Take over-the-counter and prescription medicines only as told by your health care provider.  Do not take baths, swim, or use a hot tub until your health care provider approves. Ask your health care provider if you can take showers. You may only be allowed to take sponge baths for bathing.  Wear compression stockings as told by your health care provider. These stockings help to prevent blood clots and reduce swelling in your legs.  Keep all follow-up visits as told by your health care provider. This is important. Contact a health care provider if:  You have pain when you urinate.  You have pus or a bad smelling discharge coming from your vagina.  You have redness, swelling, or pain around your incision.  You have fluid or blood coming from your incision.  Your  incision feels warm to the touch.  You have pus or a bad smell coming from your incision.  You have a fever.  Your incision starts to break open.  You have pain in the abdomen, and it gets worse or does not get better when you take medicine.  You develop a rash.  You develop nausea and vomiting.  You feel lightheaded. Get help right away if:  You develop pain in your chest or leg.  You become short of breath.  You faint.  You have increased bleeding from your vagina. Summary  After the procedure, it is common to have pain, bleeding in the vagina, and symptoms of menopause.  Follow instructions from your health care provider about how to take  care of your incision.  Follow instructions from your health care provider about activities and restrictions.  Check your incision every day for signs of infection and report any symptoms to your health care provider. This information is not intended to replace advice given to you by your health care provider. Make sure you discuss any questions you have with your health care provider. Document Released: 05/12/2005 Document Revised: 06/16/2016 Document Reviewed: 06/16/2016 Elsevier Interactive Patient Education  [employer].

## 2017-10-02 NOTE — Transfer of Care (Signed)
Immediate Anesthesia Transfer of Care Note  Patient: MAHITHA HICKLING  Procedure(s) Performed: LAPAROSCOPIC VENTRAL WALL ASSISTED  HERNIA REPAIR WITH MESH (N/A Abdomen) INSERTION OF MESH (N/A Abdomen) LAPAROSCOPIC SALPINGO OOPHORECTOMY, CYSTOSCOPY (Bilateral Abdomen)  Patient Location: PACU  Anesthesia Type:General  Level of Consciousness: drowsy and responds to stimulation  Airway & Oxygen Therapy: Patient Spontanous Breathing and Patient connected to nasal cannula oxygen  Post-op Assessment: Report given to RN  Post vital signs: Reviewed and stable  Last Vitals:  Vitals Value Taken Time  BP 139/69 10/02/2017 12:22 PM  Temp    Pulse 94 10/02/2017 12:27 PM  Resp 21 10/02/2017 12:27 PM  SpO2 95 % 10/02/2017 12:27 PM  Vitals shown include unvalidated device data.  Last Pain:  Vitals:   10/02/17 0532  TempSrc: Oral      Patients Stated Pain Goal: 5 (15/72/62 0355)  Complications: No apparent anesthesia complications

## 2017-10-02 NOTE — Anesthesia Postprocedure Evaluation (Signed)
Anesthesia Post Note  Patient: Ann Fernandez  Procedure(s) Performed: LAPAROSCOPIC VENTRAL WALL ASSISTED  HERNIA REPAIR WITH MESH (N/A Abdomen) INSERTION OF MESH (N/A Abdomen) LAPAROSCOPIC SALPINGO OOPHORECTOMY, CYSTOSCOPY (Bilateral Abdomen)     Patient location during evaluation: PACU Anesthesia Type: General Level of consciousness: awake Pain management: pain level controlled Vital Signs Assessment: post-procedure vital signs reviewed and stable Respiratory status: spontaneous breathing Cardiovascular status: stable Postop Assessment: no apparent nausea or vomiting Anesthetic complications: no    Last Vitals:  Vitals:   10/02/17 1315 10/02/17 1330  BP: (!) 122/59 (!) 123/54  Pulse: 86 86  Resp: 17 14  Temp:    SpO2: 94% 95%    Last Pain:  Vitals:   10/02/17 1315  TempSrc:   PainSc: 0-No pain   Pain Goal: Patients Stated Pain Goal: 5 (10/02/17 0559)               [place]

## 2017-10-02 NOTE — H&P (Signed)
Ann Fernandez DOB: 12/28/1955   Patient Care Team: Neale Burly, MD as PCP - General (Internal Medicine) Michael Boston, MD as Consulting Physician (General Surgery) Nunzio Cobbs, MD as Consulting Physician (Obstetrics and Gynecology) Freddie Breech, MD as Referring Physician (Neurosurgery)  ` Patient sent for surgical consultation at the request of Dr. Gala Romney  Chief Complaint: Incisional hernia. Request for combined case with Gyn  The patient is a very pleasant woman who has noticed a lump around her belly button since her back surgery last year. She had an anterior lumbar fusion done by Dr. Ernesto Rutherford through the [facility] in [place]. Has noted some increasing discomfort. No nausea or vomiting. She does not smoke. She claims she has prediabetes on metformin. She is obese. She struggled with some chronic coughing for the past 6 months. She'll take care of her grandchildren and has moderate intense activity. Gastroenterology allow. She was having some lower abdominal discomfort. Her gynecologist and worked her up and the patient has a right complex ovarian cyst concerning.   Surgery recommended. Because she had an incisional hernia, surgical consultation requested to see if this could be repaired at the same time. Patient tries to exercise regularly. No cardiopulmonary issues. The struggle with some hip and knee arthropathies. Had one be replaced. She does have some irregular bowels and claims that she's had irritable bowel syndrome. She takes Citrucel every day and that is helped along with high fiber for vegetable diet. She has a family history of rectal cancer. Its endoscopies every 5 years. She is due for 5 year follow-up through the [facility] gastroenterology. Dr. Zella Richer and Rosana Hoes in our group did surgery on her mom so she prefers to be with our group if possible.  (Review of systems as stated in this history (HPI) or in the review  of systems. Otherwise all other 12 point ROS are negative) ` ` `   Past Surgical History Sabino Gasser; 08/11/2017 9:35 AM) Breast Biopsy Left. Hysterectomy (not due to cancer) - Partial Knee Surgery Right. Oral Surgery Spinal Surgery - Lower Back Spinal Surgery - Neck Tonsillectomy  Diagnostic Studies History Sabino Gasser; 08/11/2017 9:35 AM) Colonoscopy 1-5 years ago Mammogram within last year Pap Smear 1-5 years ago  Allergies Sabino Gasser; 08/11/2017 9:35 AM) Penicillins Sulfa Drugs Allergies Reconciled  Medication History Sabino Gasser; 6/60/6301 6:01 AM) Bystolic (10MG  Tablet, Oral) Active. Diclofenac Sodium (75MG  Tablet DR, Oral) Active. HydroCHLOROthiazide (25MG  Tablet, Oral) Active. Losartan Potassium (50MG  Tablet, Oral) Active. Rosuvastatin Calcium (10MG  Tablet, Oral) Active. MetFORMIN HCl (500MG  Tablet, Oral) Active. Mometasone Furoate (50MCG/ACT Suspension, Nasal) Active. Fish Oil (Oral) Specific strength unknown - Active. Medications Reconciled  Social History Sabino Gasser; 08/11/2017 9:35 AM) Caffeine use Coffee, Tea. No alcohol use No drug use Tobacco use Never smoker.  Family History Sabino Gasser; 08/11/2017 9:35 AM) Arthritis Daughter, Father, Mother. Bleeding disorder Daughter. Cancer Family Members In General. Cerebrovascular Accident Family Members In [place] Mother. Colon Polyps Father. Diabetes Mellitus Father, Mother. Heart Disease Brother, Father, Mother. Heart disease in female family member before age 62 Heart disease in female family member before age 62 Hypertension Brother, Father. Malignant Neoplasm Of Pancreas Family Members In General. Rectal Cancer Mother. Respiratory Condition Mother. Thyroid problems Daughter, Family Members In General, Mother.  Pregnancy / Birth History Sabino Gasser; 08/11/2017 9:35 AM) Age at menarche 62 years. Age of menopause  30-55 Gravida 2 Maternal age 62-20 Para 2  Other Problems Sabino Gasser; 08/11/2017 9:35 AM) Arthritis  Back Pain Diverticulosis Gastroesophageal Reflux Disease General anesthesia - complications High blood pressure Hypercholesterolemia Sleep Apnea     Review of Systems Sabino Gasser; 08/11/2017 9:35 AM) General Not Present- Appetite Loss, Chills, Fatigue, Fever, Night Sweats, Weight Gain and Weight Loss. Skin Present- Dryness. Not Present- Change in Wart/Mole, Hives, Jaundice, New Lesions, Non-Healing Wounds, Rash and Ulcer. HEENT Present- Hearing Loss, Seasonal Allergies and Wears glasses/contact lenses. Not Present- Earache, Hoarseness, Nose Bleed, Oral Ulcers, Ringing in the Ears, Sinus Pain, Sore Throat, Visual Disturbances and Yellow Eyes. Respiratory Not Present- Bloody sputum, Chronic Cough, Difficulty Breathing, Snoring and Wheezing. Breast Not Present- Breast Mass, Breast Pain, Nipple Discharge and Skin Changes. Cardiovascular Not Present- Chest Pain, Difficulty Breathing Lying Down, Leg Cramps, Palpitations, Rapid Heart Rate, Shortness of Breath and Swelling of Extremities. Gastrointestinal Present- Abdominal Pain and Bloating. Not Present- Bloody Stool, Change in Bowel Habits, Chronic diarrhea, Constipation, Difficulty Swallowing, Excessive gas, Gets full quickly at meals, Hemorrhoids, Indigestion, Nausea, Rectal Pain and Vomiting. Female Genitourinary Not Present- Frequency, Nocturia, Painful Urination, Pelvic Pain and Urgency. Musculoskeletal Present- Back Pain and Joint Pain. Not Present- Joint Stiffness, Muscle Pain, Muscle Weakness and Swelling of Extremities. Neurological Not Present- Decreased Memory, Fainting, Headaches, Numbness, Seizures, Tingling, Tremor, Trouble walking and Weakness. Psychiatric Not Present- Anxiety, Bipolar, Change in Sleep Pattern, Depression, Fearful and Frequent crying. Endocrine Not Present- Cold Intolerance, Excessive Hunger,  Hair Changes, Heat Intolerance, Hot flashes and New Diabetes. Hematology Not Present- Blood Thinners, Easy Bruising, Excessive bleeding, Gland problems, HIV and Persistent Infections.  Vitals Sabino Gasser; 08/11/2017 9:36 AM) 08/11/2017 9:36 AM Weight: 221.38 lb Height: 65in Body Surface Area: 2.07 m Body Mass Index: 36.84 kg/m  Temp.: 98.52F(Oral)  Pulse: 69 (Regular)  BP: 184/104 (Sitting, Right Arm, Standard)   BP (!) 149/61   Pulse (!) 58   Temp 98 F (36.7 C) (Oral)   Resp 16   Ht 5\' 5"  (1.651 m)   Wt 99.7 kg (219 lb 14.4 oz)   LMP 05/26/1993 (Within Years)   SpO2 96%   BMI 36.59 kg/m     Physical Exam [name]; 08/11/2017 10:06 AM)  General Mental Status-Alert. General Appearance-Not in acute distress, Not Sickly. Orientation-Oriented X3. Hydration-Well hydrated. Voice-Normal.  Integumentary Global Assessment Upon inspection and palpation of skin surfaces of the - Axillae: non-tender, no inflammation or ulceration, no drainage. and Distribution of scalp and body hair is normal. General Characteristics Temperature - normal warmth is noted.  Head and Neck Head-normocephalic, atraumatic with no lesions or palpable masses. Face Global Assessment - atraumatic, no absence of expression. Neck Global Assessment - no abnormal movements, no bruit auscultated on the right, no bruit auscultated on the left, no decreased range of motion, non-tender. Trachea-midline. Thyroid Gland Characteristics - non-tender.  Eye Eyeball - Left-Extraocular movements intact, No Nystagmus. Eyeball - Right-Extraocular movements intact, No Nystagmus. Cornea - Left-No Hazy. Cornea - Right-No Hazy. Sclera/Conjunctiva - Left-No scleral icterus, No Discharge. Sclera/Conjunctiva - Right-No scleral icterus, No Discharge. Pupil - Left-Direct reaction to light normal. Pupil - Right-Direct reaction to light  normal.  ENMT Ears Pinna - Left - no drainage observed, no generalized tenderness observed. Right - no drainage observed, no generalized tenderness observed. Nose and Sinuses External Inspection of the Nose - no destructive lesion observed. Inspection of the nares - Left - quiet respiration. Right - quiet respiration. Mouth and Throat Lips - Upper Lip - no fissures observed, no pallor noted. Lower Lip - no fissures observed, no pallor noted. Nasopharynx -  no discharge present. Oral Cavity/Oropharynx - Tongue - no dryness observed. Oral Mucosa - no cyanosis observed. Hypopharynx - no evidence of airway distress observed.  Chest and Lung Exam Inspection Movements - Normal and Symmetrical. Accessory muscles - No use of accessory muscles in breathing. Palpation Palpation of the chest reveals - Non-tender. Auscultation Breath sounds - Normal and Clear.  Cardiovascular Auscultation Rhythm - Regular. Murmurs & Other Heart Sounds - Auscultation of the heart reveals - No Murmurs and No Systolic Clicks.  Abdomen Inspection Inspection of the abdomen reveals - No Visible peristalsis and No Abnormal pulsations. Umbilicus - No Bleeding, No Urine drainage. Palpation/Percussion Palpation and Percussion of the abdomen reveal - Soft, Non Tender, No Rebound tenderness, No Rigidity (guarding) and No Cutaneous hyperesthesia. Note: 7 cm bulging periumbilically that reduces down to a smaller fascial defect. Obese. Her midline incision otherwise closed no other obvious hernias. Abdomen soft. Not distended. No distasis recti.   Female Genitourinary Sexual Maturity Tanner 5 - Adult hair pattern. Note: No vaginal bleeding nor discharge  Peripheral Vascular Upper Extremity Inspection - Left - No Cyanotic nailbeds, Not Ischemic. Right - No Cyanotic nailbeds, Not Ischemic.  Neurologic Neurologic evaluation reveals -normal attention span and ability to concentrate, able to name objects and  repeat phrases. Appropriate fund of knowledge , normal sensation and normal coordination. Mental Status Affect - not angry, not paranoid. Cranial Nerves-Normal Bilaterally. Gait-Normal.  Neuropsychiatric Mental status exam performed with findings of-able to articulate well with normal speech/language, rate, volume and coherence, thought content normal with ability to perform basic computations and apply abstract reasoning and no evidence of hallucinations, delusions, obsessions or homicidal/suicidal ideation. Note: Pleasant. Chatty.  Musculoskeletal Global Assessment Spine, Ribs and Pelvis - no instability, subluxation or laxity. Right Upper Extremity - no instability, subluxation or laxity.  Lymphatic Head & Neck  General Head & Neck Lymphatics: Bilateral - Description - No Localized lymphadenopathy. Axillary  General Axillary Region: Bilateral - Description - No Localized lymphadenopathy. Femoral & Inguinal  Generalized Femoral & Inguinal Lymphatics: Left - Description - No Localized lymphadenopathy. Right - Description - No Localized lymphadenopathy.    Assessment & Plan   INCISIONAL HERNIA, WITHOUT OBSTRUCTION OR GANGRENE (K43.2) Impression: Pleasant woman with obvious periumbilical reducible mass consistent with incisional hernia. Most likely due to combination of her chronic coughing, moderate activity requirements, and obesity.  I think she would benefit from surgical repair. Laparoscopic underlay mesh with primary repair over it.  It is reasonable to coordinate this at the same time of her planned salpingo-oophorectomy with her gynecologist. We will work to coordinate a convenient time.  I did caution the patient that she will have pain and soreness that will require narcotics and a aggressive increase in her bowel regimen to avoid constipation. I would discourage her from trying to take care of her grandkids for the first 2 weeks at least until she is  off narcotics and feeling more dependent, especially with the fact that she has to drive to [facility] to help take care of them  The anatomy & physiology of the abdominal wall was discussed. The pathophysiology of hernias was discussed. Natural history risks without surgery including progeressive enlargement, pain, incarceration, & strangulation was discussed. Contributors to complications such as smoking, obesity, diabetes, prior surgery, etc were discussed.  I feel the risks of no intervention will lead to serious problems that outweigh the operative risks; therefore, I recommended surgery to reduce and repair the hernia. I explained laparoscopic techniques with possible need for  an open approach. I noted the probable use of mesh to patch and/or buttress the hernia repair  Risks such as bleeding, infection, abscess, need for further treatment, heart attack, death, and other risks were discussed. I noted a good likelihood this will help address the problem. Goals of post-operative recovery were discussed as well. Possibility that this will not correct all symptoms was explained. I stressed the importance of low-impact activity, aggressive pain control, avoiding constipation, & not pushing through pain to minimize risk of post-operative chronic pain or injury. Possibility of reherniation especially with smoking, obesity, diabetes, immunosuppression, and other health conditions was discussed. We will work to minimize complications.  RIGHT OVARIAN CYST (N83.201) Impression: Atypical right ovarian cyst. Planned salpingo-oophorectomy by her gynecologist, Dr. Quincy Simmonds. Most likely she will do both sides since the patient is postmenopausal already. Request for combined procedure. We will work to coordinate convenient time. From my standpoint, she may need to stay overnight since its a lot of surgery. May be able to go home same day. In general what prefer [facility] but open to [facility] or [facility]  surgical [facility] if her gynecologist agrees.  [name], M.D., F.A.C.S. Gastrointestinal and Minimally Invasive Surgery [facility] [address], [place], [place] [address] [phone] Main / Paging

## 2017-10-02 NOTE — Op Note (Signed)
OPERATIVE REPORT   PREOPERATIVE DIAGNOSES:   Complex right ovarian cyst  POSTOPERATIVE DIAGNOSES:   Complex right ovarian cyst, pelvic adhesions.   PROCEDURES:   Laparoscopic bilateral salpingo-oophorectomy with collection of pelvic washings, and cystoscopy.   SURGEON:  Lenard Galloway, M.D.  ASSISTANT:    Dorothy Spark, M.D.  ANESTHESIA:  General endotracheal, local with 0.25% Marcaine.  EBL:  10 cc.   URINE OUTPUT:   65 cc.   IV FLUIDS:   650 cc.   COMPLICATIONS:  None.  INDICATIONS FOR THE PROCEDURE:  The patient is a 62 year old Gravida 2, 22 2 Caucasian female who presented with right lower quadrant pain.  Pelvic ultrasound showed a complex right ovarian cyst with solid areas and a separate cystic area consistent with a possible hydrosalpinx.  She had a normal CA125.  She was noted to have a ventral hernia along a prior midline vertical incision.  She was referred to Dr. Michael Fernandez for a laparoscopic hernia repair to be performed in conjunction with a laparoscopic bilateral salpingo-oophorectomy with collection of pelvic washings.  Risks, benefits, and alternatives are reviewed with the patient, who wishes to proceed.  FINDINGS:  Examination under anesthesia revealed an absent uterus and no adnexal masses.  The patient had a second degree rectocele which was not appreciated at this level at her recent visits. The ventral hernia was present at the level of the umbilicus.   Laparoscopy demonstrated an absent uterus and bilateral tubes and ovaries which were densely adherent to one another in the midline and at the top of the vaginal apex. Cystic formation measuring about 3 cm was visible with the ovarian tissue which was all congruous as though the ovaries had been sutured together in the midline.  The umbilical region contained omental adhesions and hernia.  The peritoneal surfaces were normal.  There was no evidence of ascites.    Cystoscopy at the termination of the procedure  revealed the bladder to be normal throughout 360 degrees including the bladder dome and trigone.  The ureters each demonstrated patency.  The urethra was normal.  SPECIMENS:  Pelvic washing were sent to pathology as one specimen, and the bilateral tubes and ovaries were sent as a second specimen.    DESCRIPTION OF PROCEDURE:  The patient was reidentified in the preoperative hold area.  She received Clindamycin and Gentamicin IV for antibiotic prophylaxis.  She received TED hose and PAS stockings for DVT prophylaxis.  The patient was transferred to the operating room where she was placed in the dorsal lithotomy position with Allen stirrups.  General endotracheal anesthesia was induced.  The Trendgard was used to support the patient on the OR table.   An examination under anesthesia was performed.  The patient's abdomen, vagina and perineum were then sterilely prepped and she was draped.  A Foley catheter was sterilely placed inside the bladder and left to gravity drainage throughout the procedure.  A sponge on a stick was placed in the vagina for surgery, and was removed at the end of the case.  Attention was turned to the abdomen where a left upper quadrant entry was performed three finger breaths below the costal margin in the midclavicular line.  The skin was injected with 0.25% Marcaine and a 5 mm incision created.  A 5 mm Optiview was placed inside the peritoneal cavity.  A CO2 pneumoperitoneum was achieved.   A 5 mm incision was created in the right and left midquadrants after the skin was  injected locally with 0.25% Marcaine. Respective 5 mm trocars were then placed under visualization of the laparoscope.     The patient was placed in Trendelenburg position. An inspection of the abdomen and pelvis was performed. The findings are as noted above.   Pelvic washings were performed with the Nezhat suction irrigator and sent to pathology.  The left infundibulopelvic ligament was grasped and the  Ligasure was used to cauterize and cut the pedicle.  Dissection continued through the broad ligament using the Ligasure.  Attention was turned to the patient's right-hand side.  The same procedure that was performed on the left tube and ovary, was repeated on the right adnexa.   Adhesions were encountered in the midline between the adnexa and the vaginal apex and peritoneum overlying the bladder. The bladder was retrograde filled with 200 cc normal saline.  A 10 mm trocar was then placed in the midline several centimeters above the pubic symphysis after injecting locally with .25% Marcaine and incising the skin.  The Harmonic scalpel was used to take down the adhesions to the vaginal cuff. The bladder was respected during the lysis of adhesions.   The bilateral tubes and ovaries were freed from all pedicles and attachments and were placed inside an Endocatch bag, which was brought through the 10 mm trocar site.  The specimen was removed in pieces and sent to pathology separately from the pelvic washings.  The trocar was replaced and the pelvis was irrigated and suctioned.  Hemostasis was good at the operative sites.   All trocars were left in place.   The Foley catheter was removed and cystoscopy was performed.  The findings are as noted above.  A new Foley catheter was then placed.   There were no complications with the surgery.   At this time the surgery was turned over to Dr. Michael Fernandez to perform the patient's hernia repair.  Please refer to this dictation separately.   Lenard Galloway, M.D.

## 2017-10-02 NOTE — Brief Op Note (Signed)
10/02/2017  9:33 AM  PATIENT:  Ann Fernandez  62 y.o. female  PRE-OPERATIVE DIAGNOSIS:  Complex right ovarian cyst  POST-OPERATIVE DIAGNOSIS:  Complex right ovarian cyst, pelvic adhesions.  PROCEDURE:  Laparoscopic bilateral salpingo-oophorectomy, lysis of adhesions, collection of pelvic washings.   SURGEON:  Surgeon(s) and Role:     * Amundson Raliegh Ip, MD - Primary    * Talbert Nan, Francesca Jewett, MD - Assisting  PHYSICIAN ASSISTANT: NA  ASSISTANTS: Sumner Boast, MD   ANESTHESIA:   local and general  EBL:  10 mL   BLOOD ADMINISTERED:none  DRAINS: Urinary Catheter (Foley)   LOCAL MEDICATIONS USED:  Marcaine 0.25%  SPECIMEN:  Source of Specimen:  Bilateral tubes and ovaries, pelvic washings  DISPOSITION OF SPECIMEN:  PATHOLOGY  COUNTS:  YES  TOURNIQUET:  * No tourniquets in log *  DICTATION: .Note written in EPIC  PATIENT DISPOSITION:   hemodynamically stable   Delay start of Pharmacological VTE agent (>24hrs) due to surgical blood loss or risk of bleeding: not applicable

## 2017-10-03 DIAGNOSIS — K430 Incisional hernia with obstruction, without gangrene: Secondary | ICD-10-CM | POA: Diagnosis not present

## 2017-10-03 LAB — GLUCOSE, CAPILLARY: Glucose-Capillary: 100 mg/dL — ABNORMAL HIGH (ref 65–99)

## 2017-10-03 MED ORDER — OXYCODONE HCL 5 MG PO TABS
ORAL_TABLET | ORAL | Status: AC
  Filled 2017-10-03: qty 1

## 2017-10-05 ENCOUNTER — Encounter (HOSPITAL_BASED_OUTPATIENT_CLINIC_OR_DEPARTMENT_OTHER): Payer: Self-pay | Admitting: Surgery

## 2017-10-09 ENCOUNTER — Telehealth: Payer: Self-pay | Admitting: Obstetrics and Gynecology

## 2017-10-09 NOTE — Telephone Encounter (Signed)
Patient called with concerns about a "itching and red rings" around her incision sites from her recent surgery. Patient reported she called Dr. Clyda Greener (co-surgeon) office two days ago and was told to take some benadryl but she is continuing to have the issues. Otherwise, patient reported she is doing well.  Last seen: Surgery 10/02/17

## 2017-10-09 NOTE — Telephone Encounter (Signed)
Surgery on 10/02/17. Patient reports red, raised, itchy "welp" around each of her incisions, no other locations on the body. Started a few days ago. Removed steri strips and gauze 5/16. Has tried benadryl po as advised by Dr. Clyda Greener office, no changes. Applied topical cortisone cream and ice, provides some relief from itching. Doing well otherwise, tylenol prn for pain.   Incisions are closed. Denies d/c, odor or warmth at incisions sites. No fever/chills.   Advised patient adhesive could be causing the irritation. This should start to resolve since bandages are removed. Keep area clean and dry, can apply the topical cortisone for relief. Return call to office should any new symptoms develop or symptoms do not resolve.   Advised Dr. Quincy Simmonds will review. I will return call if any additional recommendations provided.   Routing to provider for final review. Patient is agreeable to disposition. Will close encounter.

## 2017-10-16 ENCOUNTER — Ambulatory Visit (INDEPENDENT_AMBULATORY_CARE_PROVIDER_SITE_OTHER): Payer: BC Managed Care – PPO | Admitting: Obstetrics and Gynecology

## 2017-10-16 ENCOUNTER — Encounter: Payer: Self-pay | Admitting: Obstetrics and Gynecology

## 2017-10-16 ENCOUNTER — Other Ambulatory Visit: Payer: Self-pay

## 2017-10-16 VITALS — BP 134/72 | HR 76 | Resp 16 | Ht 64.75 in

## 2017-10-16 DIAGNOSIS — Z9889 Other specified postprocedural states: Secondary | ICD-10-CM

## 2017-10-16 NOTE — Progress Notes (Signed)
GYNECOLOGY  VISIT   HPI: 63 y.o.   Married  Caucasian  female   G2P2002 with Patient's last menstrual period was 05/26/1993 (within years).   here for 2 week post op  LAPAROSCOPIC VENTRAL WALL ASSISTED HERNIA REPAIR WITH MESH (N/A Abdomen) INSERTION OF MESH (N/A Abdomen) LAPAROSCOPIC SALPINGO OOPHORECTOMY, CYSTOSCOPY (Bilateral Abdomen)     Pathology - benign serous cystadenofibroma of the ovary, peritoneal washings benign.   Had appt with Dr. Johney Maine and she was released from post op care.   Has been watering plants and walking the dog.  Feels like her bowel function and bladder function is good.   GYNECOLOGIC HISTORY: Patient's last menstrual period was 05/26/1993 (within years). Contraception:  Hysterectomy -- ovaries remain Menopausal hormone therapy:  none Last mammogram:  06/22/17 BIRADS 1 negative/density b Last pap smear:   2009 Negative        OB History    Gravida  2   Para  2   Term  2   Preterm      AB      Living  2     SAB      TAB      Ectopic      Multiple      Live Births                 Patient Active Problem List   Diagnosis Date Noted  . Incarcerated incisional hernia s/p lap repair w mesh 10/02/2017 10/02/2017  . Right ovarian cyst 09/12/2017  . Heart murmur 01/01/2016  . Obesity (BMI 30-39.9) 06/27/2014  . OSA (obstructive sleep apnea) 03/24/2014  . Hyperlipidemia 01/20/2014  . DOE (dyspnea on exertion) 01/20/2014  . Chest pain 01/20/2014  . Acute medial meniscal tear 05/25/2013  . 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 Chronic back pain   . Arthritis, degenerative 09/26/2011  . Encounter for long-term (current) use of other medications 09/26/2011  . Rhinitis 07/05/2011  . HYPERLIPIDEMIA 12/21/2006  . Essential hypertension 12/21/2006  . DIVERTICULOSIS, COLON 12/21/2006    Past Medical History:  Diagnosis Date  . Allergy   . Anxiety    regarding  upcoming breast surgery  . Atypical ductal hyperplasia of left  breast   . DDD (degenerative disc disease)   . Diabetes mellitus without complication ([place])   . Diverticulosis of colon   . Family history of adverse reaction to anesthesia    Anesthesia said they woke her mother up too fast and developed A-Fib .  Marland Kitchen GERD (gastroesophageal reflux disease)   . Hearing loss   . Hyperlipidemia   . Hypertension   . IBS (irritable bowel syndrome)   . Mild sleep apnea   . Obesity (BMI 30-39.9) 06/27/2014  . OSA on CPAP 06/27/2014  . PONV (postoperative nausea and vomiting)    Position carefully due to Ray-Cage back surgeries  . Wears glasses     Past Surgical History:  Procedure Laterality Date  . ABDOMINAL HYSTERECTOMY  1994   TVH for DUB--ovaries retained    . ANTERIOR LUMBAR FUSION    . ANTERIOR LUMBAR FUSION  2018   [facility] Dr Jinny Sanders.  2 level ALIF at L4-5 and L5-S1  . BACK SURGERY  2011   lumb cyst  . BACK SURGERY  04/18/2016   Anterior and posterior spinal fusion  . BREAST SURGERY    . CERVICAL LAMINECTOMY    . CYST REMOVAL TRUNK     LOWER BACK  . DILATION AND CURETTAGE OF UTERUS    .  INSERTION OF MESH N/A 10/02/2017   Procedure: INSERTION OF MESH;  Surgeon: Michael Boston, MD;  Location: [place];  Service: General;  Laterality: N/A;  . JOINT REPLACEMENT     partial right knee  . KNEE ARTHROSCOPY    . KNEE ARTHROSCOPY Right 05/25/2013   Procedure: RIGHT ARTHROSCOPY KNEE, PARTIAL MEDIAL MENISCECTOMY, CHONDROPLASTY, PARTIAL LATERAL MENISCECTOMY;  Surgeon: Gearlean Alf, MD;  Location: [facility];  Service: Orthopedics;  Laterality: Right;  . LAPAROSCOPIC SALPINGO OOPHERECTOMY Bilateral 10/02/2017   Procedure: LAPAROSCOPIC SALPINGO OOPHORECTOMY, CYSTOSCOPY;  Surgeon: Nunzio Cobbs, MD;  Location: [facility];  Service: Gynecology;  Laterality: Bilateral;  . PARTIAL MASTECTOMY WITH NEEDLE LOCALIZATION Left 06/03/2013   Procedure: PARTIAL MASTECTOMY WITH NEEDLE LOCALIZATION;  Surgeon: [name], MD;   Location: [place];  Service: General;  Laterality: Left;  . POSTERIOR LUMBAR FUSION    . SINUS IRRIGATION    . TONSILLECTOMY    . TRIGGER FINGER RELEASE    . VENTRAL HERNIA REPAIR N/A 10/02/2017   Procedure: LAPAROSCOPIC VENTRAL WALL ASSISTED  HERNIA REPAIR WITH MESH;  Surgeon: Michael Boston, MD;  Location: [facility];  Service: General;  Laterality: N/A;  . WISDOM TOOTH EXTRACTION      Current Outpatient Medications  Medication Sig Dispense Refill  . acetaminophen (PAIN RELIEVER EXTRA STRENGTH) 500 MG tablet Take 500 mg by mouth every 6 (six) hours as needed for moderate pain or headache.     . calcium-vitamin D (OSCAL WITH D) 500-200 MG-UNIT per tablet Take 1 tablet by mouth daily with breakfast.     . diclofenac (VOLTAREN) 75 MG EC tablet Take 75 mg by mouth every evening.     . fexofenadine (ALLEGRA) 180 MG tablet Take 180 mg by mouth every evening.     . fish oil-omega-3 fatty acids 1000 MG capsule Take 1 g by mouth 2 (two) times daily.     Marland Kitchen gabapentin (NEURONTIN) 300 MG capsule Take 1 capsule (300 mg total) by mouth 2 (two) times daily. Increase to 4x/day as needed 60 capsule 1  . hydrochlorothiazide (HYDRODIURIL) 25 MG tablet Take 25 mg by mouth daily    . ketotifen (ZADITOR) 0.025 % ophthalmic solution Place 1 drop into both eyes daily as needed (for dry eyes).    Marland Kitchen losartan (COZAAR) 25 MG tablet Take 1 tablet (25 mg total) by mouth daily. (Patient taking differently: Take 25 mg by mouth every evening. ) 90 tablet 3  . meclizine (ANTIVERT) 25 MG tablet Take 25 mg by mouth 3 (three) times daily as needed for dizziness.     . metFORMIN (GLUCOPHAGE) 500 MG tablet Take 500 mg by mouth every evening.     . methylcellulose (CITRUCEL) oral powder Take 1 packet by mouth daily.     . mometasone (NASONEX) 50 MCG/ACT nasal spray Place 2 sprays into the nose at bedtime.     . nebivolol (BYSTOLIC) 10 MG tablet Take 10 mg by mouth every morning.    Marland Kitchen omeprazole  (PRILOSEC) 20 MG capsule Take 20 mg by mouth daily as needed (for acid reflux).     . rosuvastatin (CRESTOR) 10 MG tablet Take 1 tablet by mouth 3 times a week. (Patient taking differently: Take 10 mg by mouth See admin instructions. Take 10 mg by mouth in the evening on Sunday, Tuesday and Thursday) 15 tablet 10   No current facility-administered medications for this visit.      ALLERGIES:  Demerol [meperidine]; Penicillins; Sulfa antibiotics; Sulfasalazine; Triamterene; Amlodipine besylate; Arimidex [anastrozole]; Lipitor [atorvastatin]; Lisinopril; Propoxyphene; and Simvastatin  Family History  Problem Relation Age of Onset  . Heart disease Mother   . Deep vein thrombosis Mother   . Colon cancer Mother   . Rectal cancer Mother   . Lung disease Mother   . Heart disease Father   . COPD Father   . Rheum arthritis Father   . Osteoarthritis Father   . Heart disease Brother   . Pulmonary embolism Brother   . Rheum arthritis Daughter     Social History   Socioeconomic History  . Marital status: Married    Spouse name: Not on file  . Number of children: Not on file  . Years of education: Not on file  . Highest education level: Not on file  Occupational History  . Not on file  Social Needs  . Financial resource strain: Not on file  . Food insecurity:    Worry: Not on file    Inability: Not on file  . Transportation needs:    Medical: Not on file    Non-medical: Not on file  Tobacco Use  . Smoking status: Never Smoker  . Smokeless tobacco: Never Used  Substance and Sexual Activity  . Alcohol use: No  . Drug use: No  . Sexual activity: Yes    Partners: Male    Comment: TVH  Lifestyle  . Physical activity:    Days per week: Not on file    Minutes per session: Not on file  . Stress: Not on file  Relationships  . Social connections:    Talks on phone: Not on file    Gets together: Not on file    Attends religious service: Not on file    Active member of club or  organization: Not on file    Attends meetings of clubs or organizations: Not on file    Relationship status: Not on file  . Intimate partner violence:    Fear of current or ex partner: Not on file    Emotionally abused: Not on file    Physically abused: Not on file    Forced sexual activity: Not on file  Other Topics Concern  . Not on file  Social History Narrative  . Not on file    Review of Systems  Constitutional: Negative.   HENT: Negative.   Eyes: Negative.   Respiratory: Negative.   Cardiovascular: Negative.   Gastrointestinal: Negative.   Endocrine: Negative.   Genitourinary: Negative.   Musculoskeletal: Negative.   Skin: Negative.   Allergic/Immunologic: Negative.   Neurological: Negative.   Hematological: Negative.   Psychiatric/Behavioral: Negative.     PHYSICAL EXAMINATION:    BP 134/72 (BP Location: Right Arm, Patient Position: Sitting, Cuff Size: Normal)   Pulse 76   Resp 16   Ht 5' 4.75" (1.645 m)   LMP 05/26/1993 (Within Years)   BMI 36.88 kg/m     General appearance: alert, cooperative and appears stated age   Abdomen: umbilical incision with eschar and suture present.  Laparoscopic incisions intact.  Abdomen is soft, non-tender, no masses,  no organomegaly  ASSESSMENT  Status post laparoscopic BOS and collection of pelvic washings. Status post ventral hernia repair with mesh placement with Dr. Johney Maine.  Rectocele.   PLAN  Surgical findings, procedure, and pathology report reviewed.  Questions invited and answered. I encouraged patient to call Dr. Clyda Greener office to receive specific instructions regarding physical activity restrictions following  her hernia surgery.  FU here for annual exam and prn.    An After Visit Summary was printed and given to the patient.

## 2017-11-30 ENCOUNTER — Telehealth: Payer: Self-pay

## 2017-11-30 MED ORDER — ROSUVASTATIN CALCIUM 10 MG PO TABS: ORAL_TABLET | ORAL | 10 refills | Status: DC

## 2017-11-30 NOTE — Telephone Encounter (Signed)
3. Hyperlipidemia - intolerant to multiple statins (atorvastatin, simvastatin, pravastatin), baseline LDL 147,  FH of premature CAD, she was referred to lipid clinic and started on Crestor 10 mg to be taken 3 times a day, she takes it twice a week, we will check lipids and CMP today.  This was what Dr. Meda Coffee said in her last office note.

## 2017-11-30 NOTE — Telephone Encounter (Signed)
Rx sent to pharmacy   

## 2018-01-14 NOTE — Progress Notes (Signed)
Patient ID: Ann Fernandez, female   DOB: 21-May-1956, 62 y.o.   MRN: [id number]                  Cardiology Office Note   Date:  01/14/2018   ID:  Ann Fernandez, DOB 1955/10/22, MRN [id number]  PCP:  Neale Burly, MD   Chief complain: 1 year follow up   History of Present Illness: Ann Fernandez is a 62 y.o. female that comes for follow up of DOE, hypertension hyperlipidemia, OSA on CPAP.   10/15/16 - 1 year follow up, she is doing great, exercising and has no symptoms, she tolerates crestor 10 mg po 3x/week started by our lipid clinic. Denies LE edema, orthopnea, PND, no claudications or palpitations. She was started on a CPAP machine for OSA.   01/15/2018 - 1 year follow up, she feels great, denies chest pain or DOE, she underwent a hernia repair in May 2019 that slowed down her exercises. Occasional myalgias with crestor but able to take it 2-3 times /week.   Past Medical History:  Diagnosis Date  . Acute medial meniscal tear 05/25/2013  . Allergy   . Anxiety    regarding  upcoming breast surgery  . Arthritis, degenerative 09/26/2011  . Atypical ductal hyperplasia of left breast   . 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 Breast, lumpectomy, Left - ATYPICAL DUCTAL HYPERPLASIA, SEE COMMENT. - SURGICAL MARGINS, NEGATIVE FOR ATYPIA OR MALIGNANCY. - PREVIOUS BIOPSY SITE IDENTIFIED. - ONE LYMPH NODE, NEGATIVE FOR TUMOR (0/1), 2. Breast, excision, Left medial margin - BENIGN BREAST TISSUE, SEE COMMENT. - NEGATIVE FOR ATYPIA OR MALIGNANCY.   Marland Kitchen Chest pain 01/20/2014  . DDD (degenerative disc disease)   . Diabetes mellitus without complication ([place])   . Diverticulosis of colon   . DIVERTICULOSIS, COLON 12/21/2006   Qualifier: Diagnosis of  By: Leanne Chang MD, Bruce    . DOE (dyspnea on exertion) 01/20/2014  . Encounter for long-term (current) use of other medications 09/26/2011  . Essential hypertension 12/21/2006     Pt states she  has a white coat syndrome and her BP is OK at home.     . Family history of adverse reaction to anesthesia    Anesthesia said they woke her mother up too fast and developed A-Fib .  Marland Kitchen GERD (gastroesophageal reflux disease)   . Hearing loss   . Heart murmur 01/01/2016  . Hyperlipidemia   . HYPERLIPIDEMIA 12/21/2006   Qualifier: Diagnosis of  By: Leanne Chang MD, Bruce    . Hypertension   . IBS (irritable bowel syndrome)   . Incarcerated incisional hernia s/p lap repair w mesh 10/02/2017 10/02/2017  . Mild sleep apnea   . Obesity (BMI 30-39.9) 06/27/2014  . OSA (obstructive sleep apnea) 03/24/2014   Mild OSA with AHI 12.4/hr now on CPAP at 10cm H2O.  Persistent oxygen desats <88% for 11 minutes on CPAP and now on nighttime O2 at 2L via CPAP   . OSA on CPAP 06/27/2014  . PONV (postoperative nausea and vomiting)    Position carefully due to Ray-Cage back surgeries  . Right ovarian cyst 09/12/2017  . Wears glasses    Past Surgical History:  Procedure Laterality Date  . ABDOMINAL HYSTERECTOMY  1994   TVH for DUB--ovaries retained    . ANTERIOR LUMBAR FUSION    . ANTERIOR LUMBAR FUSION  2018   [facility] Dr Jinny Sanders.  2 level ALIF at L4-5  and L5-S1  . BACK SURGERY  2011   lumb cyst  . BACK SURGERY  04/18/2016   Anterior and posterior spinal fusion  . BREAST SURGERY    . CERVICAL LAMINECTOMY    . CYST REMOVAL TRUNK     LOWER BACK  . DILATION AND CURETTAGE OF UTERUS    . INSERTION OF MESH N/A 10/02/2017   Procedure: INSERTION OF MESH;  Surgeon: Michael Boston, MD;  Location: [place];  Service: General;  Laterality: N/A;  . JOINT REPLACEMENT     partial right knee  . KNEE ARTHROSCOPY    . KNEE ARTHROSCOPY Right 05/25/2013   Procedure: RIGHT ARTHROSCOPY KNEE, PARTIAL MEDIAL MENISCECTOMY, CHONDROPLASTY, PARTIAL LATERAL MENISCECTOMY;  Surgeon: Gearlean Alf, MD;  [facility];  Service: Orthopedics;  Laterality: Right;  . LAPAROSCOPIC SALPINGO OOPHERECTOMY Bilateral 10/02/2017    Procedure: LAPAROSCOPIC SALPINGO OOPHORECTOMY, CYSTOSCOPY;  Surgeon: Nunzio Cobbs, MD;  Location: [facility];  Service: Gynecology;  Laterality: Bilateral;  . PARTIAL MASTECTOMY WITH NEEDLE LOCALIZATION Left 06/03/2013   Procedure: PARTIAL MASTECTOMY WITH NEEDLE LOCALIZATION;  Surgeon: [name], MD;  Location: [place];  Service: General;  Laterality: Left;  . POSTERIOR LUMBAR FUSION    . SINUS IRRIGATION    . TONSILLECTOMY    . TRIGGER FINGER RELEASE    . VENTRAL HERNIA REPAIR N/A 10/02/2017   Procedure: LAPAROSCOPIC VENTRAL WALL ASSISTED  HERNIA REPAIR WITH MESH;  Surgeon: Michael Boston, MD;  Location: [place];  Service: General;  Laterality: N/A;  . WISDOM TOOTH EXTRACTION     Current Outpatient Medications  Medication Sig Dispense Refill  . acetaminophen (PAIN RELIEVER EXTRA STRENGTH) 500 MG tablet Take 500 mg by mouth every 6 (six) hours as needed for moderate pain or headache.     . calcium-vitamin D (OSCAL WITH D) 500-200 MG-UNIT per tablet Take 1 tablet by mouth daily with breakfast.     . diclofenac (VOLTAREN) 75 MG EC tablet Take 75 mg by mouth every evening.     . fexofenadine (ALLEGRA) 180 MG tablet Take 180 mg by mouth every evening.     . fish oil-omega-3 fatty acids 1000 MG capsule Take 1 g by mouth 2 (two) times daily.     Marland Kitchen gabapentin (NEURONTIN) 300 MG capsule Take 1 capsule (300 mg total) by mouth 2 (two) times daily. Increase to 4x/day as needed 60 capsule 1  . hydrochlorothiazide (HYDRODIURIL) 25 MG tablet Take 25 mg by mouth daily    . ketotifen (ZADITOR) 0.025 % ophthalmic solution Place 1 drop into both eyes daily as needed (for dry eyes).    Marland Kitchen losartan (COZAAR) 25 MG tablet Take 1 tablet (25 mg total) by mouth daily. (Patient taking differently: Take 25 mg by mouth every evening. ) 90 tablet 3  . meclizine (ANTIVERT) 25 MG tablet Take 25 mg by mouth 3 (three) times daily as needed for dizziness.       . metFORMIN (GLUCOPHAGE) 500 MG tablet Take 500 mg by mouth every evening.     . methylcellulose (CITRUCEL) oral powder Take 1 packet by mouth daily.     . mometasone (NASONEX) 50 MCG/ACT nasal spray Place 2 sprays into the nose at bedtime.     . nebivolol (BYSTOLIC) 10 MG tablet Take 10 mg by mouth every morning.    Marland Kitchen omeprazole (PRILOSEC) 20 MG capsule Take 20 mg by mouth daily as needed (for acid reflux).     Marland Kitchen  rosuvastatin (CRESTOR) 10 MG tablet Take 1 tablet by mouth 3 times a week. 15 tablet 10   No current facility-administered medications for this visit.    Allergies:   Demerol [meperidine]; Penicillins; Sulfa antibiotics; Sulfasalazine; Triamterene; Amlodipine besylate; Arimidex [anastrozole]; Lipitor [atorvastatin]; Lisinopril; Propoxyphene; and Simvastatin   Social History:  The patient  reports that she has never smoked. She has never used smokeless tobacco. She reports that she does not drink alcohol or use drugs.   Family History:  The patient's family history includes COPD in her father; Colon cancer in her mother; Deep vein thrombosis in her mother; Heart disease in her brother, father, and mother; Lung disease in her mother; Osteoarthritis in her father; Pulmonary embolism in her brother; Rectal cancer in her mother; Rheum arthritis in her daughter and father.   ROS:  Please see the history of present illness.   Otherwise, review of systems are positive for none.   All other systems are reviewed and negative.   PHYSICAL EXAM: VS:  LMP 05/26/1993 (Within Years)  , BMI There is no height or weight on file to calculate BMI. GEN: Well nourished, well developed, in no acute distress  HEENT: normal  Neck: no JVD, carotid bruits, or masses Cardiac: RRR; 2/6 systolic murmurs at the right upper sternal border, rubs, or gallops,no edema  Respiratory:  clear to auscultation bilaterally, normal work of breathing GI: soft, nontender, nondistended, + BS MS: no deformity or atrophy   Skin: warm and dry, no rash Neuro:  Strength and sensation are intact Psych: euthymic mood, full affect  EKG:  SB, otherwise normal ECG, unchanged from prior - personally reviewed.  Recent Labs: 09/24/2017: BUN 16; Creatinine, Ser 0.65; Hemoglobin 14.2; Platelets 192; Potassium 4.2; Sodium 141   Lipid Panel    Component Value Date/Time   CHOL 196 10/15/2016 1051   TRIG 256 (H) 10/15/2016 1051   HDL 43 10/15/2016 1051   CHOLHDL 4.6 (H) 10/15/2016 1051   CHOLHDL 4.4 10/12/2015 1114   VLDL 44 (H) 10/12/2015 1114   LDLCALC 102 (H) 10/15/2016 1051   LDLDIRECT 172.8 06/29/2012 1003   Wt Readings from Last 3 Encounters:  10/02/17 219 lb 14.4 oz (99.7 kg)  09/24/17 221 lb 12.8 oz (100.6 kg)  09/14/17 221 lb (100.2 kg)    TTE: 01/2014 - Left ventricle: The cavity size was normal. Wall thickness was increased in a pattern of mild LVH. Systolic function was normal. The estimated ejection fraction was in the range of 60% to 65%. Wall motion was normal; there were no regional wall motion abnormalities. Features are consistent with a pseudonormal left ventricular filling pattern, with concomitant abnormal relaxation and increased filling pressure (grade 2 diastolic dysfunction). - Left atrium: The atrium was mildly dilated. Anterior-posterior dimension: 45 mm. 31.17ml/m2 - Index.   ASSESSMENT AND PLAN:  1. CP and DOE - resolved with BP control abd treatment of sleep apnea, normal stress test in 2015.  2. HTN - controlled   3. Hyperlipidemia - intolerant to multiple statins (atorvastatin, simvastatin, pravastatin), FH of premature CAD, she was referred to lipid clinic and started on Crestor 10 mg to be taken 3 times a day, she takes it twice a week, we will check lipids and CMP today.  Disposition:   FU with me in 1 year.  Signed, Ena Dawley, MD  01/14/2018 5:46 PM    [facility] [address], [place], [place]  [address] Phone: [phone]; Fax: [phone]  ROS ] 

## 2018-01-15 ENCOUNTER — Encounter: Payer: Self-pay | Admitting: Cardiology

## 2018-01-15 ENCOUNTER — Ambulatory Visit (INDEPENDENT_AMBULATORY_CARE_PROVIDER_SITE_OTHER): Payer: BC Managed Care – PPO | Admitting: Cardiology

## 2018-01-15 VITALS — BP 124/82 | HR 68 | Ht 64.75 in | Wt 224.6 lb

## 2018-01-15 DIAGNOSIS — E7849 Other hyperlipidemia: Secondary | ICD-10-CM

## 2018-01-15 DIAGNOSIS — G4733 Obstructive sleep apnea (adult) (pediatric): Secondary | ICD-10-CM

## 2018-01-15 DIAGNOSIS — I10 Essential (primary) hypertension: Secondary | ICD-10-CM | POA: Diagnosis not present

## 2018-01-15 DIAGNOSIS — Z789 Other specified health status: Secondary | ICD-10-CM

## 2018-01-15 LAB — COMPREHENSIVE METABOLIC PANEL
ALT: 42 IU/L — ABNORMAL HIGH (ref 0–32)
AST: 31 IU/L (ref 0–40)
Albumin/Globulin Ratio: 2.2 (ref 1.2–2.2)
Albumin: 4.6 g/dL (ref 3.6–4.8)
Alkaline Phosphatase: 59 IU/L (ref 39–117)
BUN/Creatinine Ratio: 17 (ref 12–28)
BUN: 13 mg/dL (ref 8–27)
Bilirubin Total: 0.7 mg/dL (ref 0.0–1.2)
CO2: 24 mmol/L (ref 20–29)
Calcium: 10.1 mg/dL (ref 8.7–10.3)
Chloride: 99 mmol/L (ref 96–106)
Creatinine, Ser: 0.75 mg/dL (ref 0.57–1.00)
GFR calc Af Amer: 99 mL/min/{1.73_m2} (ref >59)
GFR calc non Af Amer: 86 mL/min/{1.73_m2} (ref >59)
Globulin, Total: 2.1 g/dL (ref 1.5–4.5)
Glucose: 94 mg/dL (ref 65–99)
Potassium: 4.6 mmol/L (ref 3.5–5.2)
Sodium: 140 mmol/L (ref 134–144)
Total Protein: 6.7 g/dL (ref 6.0–8.5)

## 2018-01-15 LAB — LIPID PANEL
Chol/HDL Ratio: 4.1 ratio (ref 0.0–4.4)
Cholesterol, Total: 182 mg/dL (ref 100–199)
HDL: 44 mg/dL (ref >39)
LDL Calculated: 86 mg/dL (ref 0–99)
Triglycerides: 260 mg/dL — ABNORMAL HIGH (ref 0–149)
VLDL Cholesterol Cal: 52 mg/dL — ABNORMAL HIGH (ref 5–40)

## 2018-01-15 LAB — TSH: TSH: 3.01 u[IU]/mL (ref 0.450–4.500)

## 2018-01-15 NOTE — Patient Instructions (Signed)
Medication Instructions:   Your physician recommends that you continue on your current medications as directed. Please refer to the Current Medication list given to you today.    Labwork:  TODAY--CMET, TSH, AND LIPIDS     Follow-Up:  Your physician wants you to follow-up in: [place] will receive a reminder letter in the mail two months in advance. If you don't receive a letter, please call our office to schedule the follow-up appointment.        If you need a refill on your cardiac medications before your next appointment, please call your pharmacy.

## 2018-01-19 ENCOUNTER — Telehealth: Payer: Self-pay | Admitting: *Deleted

## 2018-01-19 NOTE — Telephone Encounter (Signed)
Left a detailed message on the pts confirmed VM that per Dr Meda Coffee, she should just continue her current dose and regimen of fish oil-omegas, and we will not send in new regimen of lovaza.  Left a detailed message for the pt to call back with any questions or concerns regarding this.

## 2018-01-19 NOTE — Telephone Encounter (Signed)
Dr. Meda Coffee, spoke with the pt and endorsed her lab results and your recommendations. Pt did want me to clarify with you if she should continue taking fish oil-omega-3 fatty acids (she takes 1 gram po bid), along with Lovaza 1 gram daily, or should she drop the fish oil, and just take Lovaza? Please advise!

## 2018-01-19 NOTE — Telephone Encounter (Signed)
-----   Message from Dorothy Spark, MD sent at 01/18/2018 11:09 AM EDT ----- Minimal ALT elevation, stable from last year, normal kidney and thyroid function, normal LDL, high TG - I would add lovaza 1 g daily

## 2018-01-19 NOTE — Telephone Encounter (Signed)
I would just continue there current regimen

## 2018-01-21 ENCOUNTER — Encounter: Payer: Self-pay | Admitting: Cardiology

## 2018-01-21 ENCOUNTER — Ambulatory Visit (INDEPENDENT_AMBULATORY_CARE_PROVIDER_SITE_OTHER): Payer: BC Managed Care – PPO | Admitting: Cardiology

## 2018-01-21 ENCOUNTER — Telehealth: Payer: Self-pay | Admitting: *Deleted

## 2018-01-21 VITALS — BP 160/80 | HR 68 | Ht 64.75 in | Wt 222.0 lb

## 2018-01-21 DIAGNOSIS — I10 Essential (primary) hypertension: Secondary | ICD-10-CM | POA: Diagnosis not present

## 2018-01-21 DIAGNOSIS — G4733 Obstructive sleep apnea (adult) (pediatric): Secondary | ICD-10-CM

## 2018-01-21 DIAGNOSIS — E669 Obesity, unspecified: Secondary | ICD-10-CM

## 2018-01-21 NOTE — Telephone Encounter (Signed)
-----   Message from Sarina Ill, RN sent at 01/21/2018  9:52 AM EDT ----- Regarding: Sleep supplies Hello,  This patient needs resmed airsoft p30 mask. Thank you so much. Suezanne Jacquet

## 2018-01-21 NOTE — Patient Instructions (Signed)
Medication Instructions:  Your physician recommends that you continue on your current medications as directed. Please refer to the Current Medication list given to you today.  Follow-Up: Your physician wants you to follow-up in: 1 year with Dr. Radford Pax. You will receive a reminder letter in the mail two months in advance. If you don't receive a letter, please call our office to schedule the follow-up appointment.   If you need a refill on your cardiac medications before your next appointment, please call your pharmacy.

## 2018-01-21 NOTE — Telephone Encounter (Signed)
Order faxed to [facility]. 

## 2018-01-21 NOTE — Progress Notes (Signed)
Cardiology Office Note:    Date:  01/21/2018   ID:  Ann Fernandez, DOB 1956/02/25, MRN [id number]  PCP:  Neale Burly, MD  Cardiologist:  Ena Dawley, MD    Referring MD: Neale Burly, MD   Chief Complaint  Patient presents with  . Sleep Apnea  . Hypertension    History of Present Illness:    Ann Fernandez is a 62 y.o. female with a hx of mild OSA with an AHI of 12.4 events per hour. She is on CPAP at 9cm H2O.  She is doing well with her CPAP device and thinks that she has gotten used to it.  She tolerates the mask and feels the pressure is adequate.  Since going on CPAP she feels rested in the am and has no significant daytime sleepiness.  She denies any significant mouth or nasal dryness or nasal congestion.  She does not think that he snores.     Past Medical History:  Diagnosis Date  . Acute medial meniscal tear 05/25/2013  . Allergy   . Anxiety    regarding  upcoming breast surgery  . Arthritis, degenerative 09/26/2011  . Atypical ductal hyperplasia of left breast   . 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 Breast, lumpectomy, Left - ATYPICAL DUCTAL HYPERPLASIA, SEE COMMENT. - SURGICAL MARGINS, NEGATIVE FOR ATYPIA OR MALIGNANCY. - PREVIOUS BIOPSY SITE IDENTIFIED. - ONE LYMPH NODE, NEGATIVE FOR TUMOR (0/1), 2. Breast, excision, Left medial margin - BENIGN BREAST TISSUE, SEE COMMENT. - NEGATIVE FOR ATYPIA OR MALIGNANCY.   Marland Kitchen Chest pain 01/20/2014  . DDD (degenerative disc disease)   . Diabetes mellitus without complication ([place])   . Diverticulosis of colon   . DIVERTICULOSIS, COLON 12/21/2006   Qualifier: Diagnosis of  By: Leanne Chang MD, Bruce    . DOE (dyspnea on exertion) 01/20/2014  . Encounter for long-term (current) use of other medications 09/26/2011  . Essential hypertension 12/21/2006     Pt states she has a white coat syndrome and her BP is OK at home.     . Family history of adverse reaction to anesthesia      Anesthesia said they woke her mother up too fast and developed A-Fib .  Marland Kitchen GERD (gastroesophageal reflux disease)   . Hearing loss   . Heart murmur 01/01/2016  . Hyperlipidemia   . HYPERLIPIDEMIA 12/21/2006   Qualifier: Diagnosis of  By: Leanne Chang MD, Bruce    . Hypertension   . IBS (irritable bowel syndrome)   . Incarcerated incisional hernia s/p lap repair w mesh 10/02/2017 10/02/2017  . Mild sleep apnea   . Obesity (BMI 30-39.9) 06/27/2014  . OSA (obstructive sleep apnea) 03/24/2014   Mild OSA with AHI 12.4/hr now on CPAP at 10cm H2O.  Persistent oxygen desats <88% for 11 minutes on CPAP and now on nighttime O2 at 2L via CPAP   . OSA on CPAP 06/27/2014  . PONV (postoperative nausea and vomiting)    Position carefully due to Ray-Cage back surgeries  . Right ovarian cyst 09/12/2017  . Wears glasses     Past Surgical History:  Procedure Laterality Date  . ABDOMINAL HYSTERECTOMY  1994   TVH for DUB--ovaries retained    . ANTERIOR LUMBAR FUSION    . ANTERIOR LUMBAR FUSION  2018   [facility] Dr Jinny Sanders.  2 level ALIF at L4-5 and L5-S1  . BACK SURGERY  2011   lumb cyst  . BACK SURGERY  04/18/2016   Anterior and posterior spinal fusion  . BREAST SURGERY    . CERVICAL LAMINECTOMY    . CYST REMOVAL TRUNK     LOWER BACK  . DILATION AND CURETTAGE OF UTERUS    . INSERTION OF MESH N/A 10/02/2017   Procedure: INSERTION OF MESH;  Surgeon: Michael Boston, MD;  Location: [place];  Service: General;  Laterality: N/A;  . JOINT REPLACEMENT     partial right knee  . KNEE ARTHROSCOPY    . KNEE ARTHROSCOPY Right 05/25/2013   Procedure: RIGHT ARTHROSCOPY KNEE, PARTIAL MEDIAL MENISCECTOMY, CHONDROPLASTY, PARTIAL LATERAL MENISCECTOMY;  Surgeon: Gearlean Alf, MD;  Location: [facility];  Service: Orthopedics;  Laterality: Right;  . LAPAROSCOPIC SALPINGO OOPHERECTOMY Bilateral 10/02/2017   Procedure: LAPAROSCOPIC SALPINGO OOPHORECTOMY, CYSTOSCOPY;  Surgeon: Nunzio Cobbs, MD;  Location:  [facility];  Service: Gynecology;  Laterality: Bilateral;  . PARTIAL MASTECTOMY WITH NEEDLE LOCALIZATION Left 06/03/2013   Procedure: PARTIAL MASTECTOMY WITH NEEDLE LOCALIZATION;  Surgeon: [name], MD;  Location: [place];  Service: General;  Laterality: Left;  . POSTERIOR LUMBAR FUSION    . SINUS IRRIGATION    . TONSILLECTOMY    . TRIGGER FINGER RELEASE    . VENTRAL HERNIA REPAIR N/A 10/02/2017   Procedure: LAPAROSCOPIC VENTRAL WALL ASSISTED  HERNIA REPAIR WITH MESH;  Surgeon: Michael Boston, MD;  Location: [place];  Service: General;  Laterality: N/A;  . WISDOM TOOTH EXTRACTION      Current Medications: Current Meds  Medication Sig  . acetaminophen (PAIN RELIEVER EXTRA STRENGTH) 500 MG tablet Take 500 mg by mouth every 6 (six) hours as needed for moderate pain or headache.   . calcium-vitamin D (OSCAL WITH D) 500-200 MG-UNIT per tablet Take 1 tablet by mouth daily with breakfast.   . diclofenac (VOLTAREN) 75 MG EC tablet Take 75 mg by mouth every evening.   . fexofenadine (ALLEGRA) 180 MG tablet Take 180 mg by mouth every evening.   . fish oil-omega-3 fatty acids 1000 MG capsule Take 1 g by mouth 2 (two) times daily.   Marland Kitchen gabapentin (NEURONTIN) 300 MG capsule Take 1 capsule (300 mg total) by mouth 2 (two) times daily. Increase to 4x/day as needed  . hydrochlorothiazide (HYDRODIURIL) 25 MG tablet Take 25 mg by mouth daily  . ketotifen (ZADITOR) 0.025 % ophthalmic solution Place 1 drop into both eyes daily as needed (for dry eyes).  Marland Kitchen losartan (COZAAR) 25 MG tablet Take 25 mg by mouth every evening.  . meclizine (ANTIVERT) 25 MG tablet Take 25 mg by mouth 3 (three) times daily as needed for dizziness.   . metFORMIN (GLUCOPHAGE) 500 MG tablet Take 500 mg by mouth every evening.   . methylcellulose (CITRUCEL) oral powder Take 1 packet by mouth daily.   . mometasone (NASONEX) 50 MCG/ACT nasal spray Place 2 sprays into the nose at  bedtime.   . nebivolol (BYSTOLIC) 10 MG tablet Take 10 mg by mouth every morning.  Marland Kitchen omeprazole (PRILOSEC) 20 MG capsule Take 20 mg by mouth daily as needed (for acid reflux).   . rosuvastatin (CRESTOR) 10 MG tablet Take 1 tablet by mouth 3 times a week.     Allergies:   Demerol [meperidine]; Penicillins; Sulfa antibiotics; Sulfasalazine; Triamterene; Amlodipine besylate; Arimidex [anastrozole]; Lipitor [atorvastatin]; Lisinopril; Propoxyphene; and Simvastatin   Social History   Socioeconomic History  . Marital status: Married    Spouse name: Not on file  .  Number of children: Not on file  . Years of education: Not on file  . Highest education level: Not on file  Occupational History  . Not on file  Social Needs  . Financial resource strain: Not on file  . Food insecurity:    Worry: Not on file    Inability: Not on file  . Transportation needs:    Medical: Not on file    Non-medical: Not on file  Tobacco Use  . Smoking status: Never Smoker  . Smokeless tobacco: Never Used  Substance and Sexual Activity  . Alcohol use: No  . Drug use: No  . Sexual activity: Yes    Partners: Male    Comment: TVH  Lifestyle  . Physical activity:    Days per week: Not on file    Minutes per session: Not on file  . Stress: Not on file  Relationships  . Social connections:    Talks on phone: Not on file    Gets together: Not on file    Attends religious service: Not on file    Active member of club or organization: Not on file    Attends meetings of clubs or organizations: Not on file    Relationship status: Not on file  Other Topics Concern  . Not on file  Social History Narrative  . Not on file     Family History: The patient's family history includes COPD in her father; Colon cancer in her mother; Deep vein thrombosis in her mother; Heart disease in her brother, father, and mother; Lung disease in her mother; Osteoarthritis in her father; Pulmonary embolism in her brother; Rectal  cancer in her mother; Rheum arthritis in her daughter and father.  ROS:   Please see the history of present illness.    ROS  All other systems reviewed and negative.   EKGs/Labs/Other Studies Reviewed:    The following studies were reviewed today: PAP download  EKG:  EKG is not ordered today.    Recent Labs: 09/24/2017: Hemoglobin 14.2; Platelets 192 01/15/2018: ALT 42; BUN 13; Creatinine, Ser 0.75; Potassium 4.6; Sodium 140; TSH 3.010   Recent Lipid Panel    Component Value Date/Time   CHOL 182 01/15/2018 0857   TRIG 260 (H) 01/15/2018 0857   HDL 44 01/15/2018 0857   CHOLHDL 4.1 01/15/2018 0857   CHOLHDL 4.4 10/12/2015 1114   VLDL 44 (H) 10/12/2015 1114   LDLCALC 86 01/15/2018 0857   LDLDIRECT 172.8 06/29/2012 1003    Physical Exam:    VS:  BP (!) 160/80   Pulse 68   Ht 5' 4.75" (1.645 m)   Wt 222 lb (100.7 kg) Comment: Pt refused to weigh- stated weight  LMP 05/26/1993 (Within Years)   SpO2 91%   BMI 37.23 kg/m     Wt Readings from Last 3 Encounters:  01/21/18 222 lb (100.7 kg)  01/15/18 224 lb 9.6 oz (101.9 kg)  10/02/17 219 lb 14.4 oz (99.7 kg)     GEN:  Well nourished, well developed in no acute distress HEENT: Normal NECK: No JVD; No carotid bruits LYMPHATICS: No lymphadenopathy CARDIAC: RRR, no murmurs, rubs, gallops RESPIRATORY:  Clear to auscultation without rales, wheezing or rhonchi  ABDOMEN: Soft, non-tender, non-distended MUSCULOSKELETAL:  No edema; No deformity  SKIN: Warm and dry NEUROLOGIC:  Alert and oriented x 3 PSYCHIATRIC:  Normal affect   ASSESSMENT:    1. OSA (obstructive sleep apnea)   2. Obesity (BMI 30-39.9)   3. Benign essential  HTN    PLAN:    In order of problems listed above:  1.  OSA - the patient is tolerating PAP therapy well without any problems. The PAP download was reviewed today and showed an AHI of 0.9/hr on 8 cm H2O with 93% compliance in using more than 4 hours nightly.  The patient has been using and benefiting  from PAP use and will continue to benefit from therapy.   2.  Obesity - I have encouraged her to get into a routine exercise program and cut back on carbs and portions.   3.  HTN - BP borderline controlled on exam today but she just took her meds and has white coat HTN.  She will continue on Bystolic 10mg  daily, Losartan 25mg  daily, HCTZ 25mg  daily.  Creatinine was normal at 0.75 on 01/15/2018.     Medication Adjustments/Labs and Tests Ordered: Current medicines are reviewed at length with the patient today.  Concerns regarding medicines are outlined above.  No orders of the defined types were placed in this encounter.  No orders of the defined types were placed in this encounter.   Signed, Fransico Him, MD  01/21/2018 9:28 AM    [place]

## 2018-07-16 ENCOUNTER — Encounter: Payer: Self-pay | Admitting: Gastroenterology

## 2018-08-02 NOTE — Progress Notes (Signed)
63 y.o. G50P2002 Married Caucasian female here for annual exam.    Saw a dietician last year for help with weight loss.   Having difficulty with BMs due to rectocele from time to time.  Uses Citrocel daily.  Occasional urinary leakage.  Can leak sometimes with a really had cough.   Her PCP wants her to have a BMD.  She has had a lot of back surgery. She does not want to do this in [place] as she has done one at [facility].  Husband retiring in June.   PCP: Dr. Sherrie Sport  Patient's last menstrual period was 05/26/1993 (within years).           Sexually active: Yes.    The current method of family planning is status post hysterectomy.    Exercising: Yes.    stretches, cardio and walking Smoker:  no  Health Maintenance: Pap: 2009 Neg History of abnormal Pap:  Yes, States hx of cryotherapy MMG: 06/28/18 BIRADS 1 negative/density b Colonoscopy: 09-09-12 normal; scheduled August 31, 2018 BMD:   01-05-14  Result :Normal TDaP:  06-25-16 Gardasil:   no CXK:GYJEHUD blood about 10 years ago Hep C:donated blood about 10 years ago Screening Labs:  PCP. Flu vaccine:  Done.  Shingles vaccine:  Done.    reports that she has never smoked. She has never used smokeless tobacco. She reports that she does not drink alcohol or use drugs.  Past Medical History:  Diagnosis Date  . Acute medial meniscal tear 05/25/2013  . Allergy   . Anxiety    regarding  upcoming breast surgery  . Arthritis, degenerative 09/26/2011  . Atypical ductal hyperplasia of left breast   . 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 Breast, lumpectomy, Left - ATYPICAL DUCTAL HYPERPLASIA, SEE COMMENT. - SURGICAL MARGINS, NEGATIVE FOR ATYPIA OR MALIGNANCY. - PREVIOUS BIOPSY SITE IDENTIFIED. - ONE LYMPH NODE, NEGATIVE FOR TUMOR (0/1), 2. Breast, excision, Left medial margin - BENIGN BREAST TISSUE, SEE COMMENT. - NEGATIVE FOR ATYPIA OR MALIGNANCY.   Marland Kitchen Chest pain 01/20/2014  . DDD  (degenerative disc disease)   . Diabetes mellitus without complication ([place])   . Diverticulosis of colon   . DIVERTICULOSIS, COLON 12/21/2006   Qualifier: Diagnosis of  By: Leanne Chang MD, Bruce    . DOE (dyspnea on exertion) 01/20/2014  . Encounter for long-term (current) use of other medications 09/26/2011  . Essential hypertension 12/21/2006     Pt states she has a white coat syndrome and her BP is OK at home.     . Family history of adverse reaction to anesthesia    Anesthesia said they woke her mother up too fast and developed A-Fib .  Marland Kitchen GERD (gastroesophageal reflux disease)   . Gout   . Hearing loss   . Heart murmur 01/01/2016  . Hyperlipidemia   . HYPERLIPIDEMIA 12/21/2006   Qualifier: Diagnosis of  By: Leanne Chang MD, Bruce    . Hypertension   . IBS (irritable bowel syndrome)   . Incarcerated incisional hernia s/p lap repair w mesh 10/02/2017 10/02/2017  . Mild sleep apnea   . Obesity (BMI 30-39.9) 06/27/2014  . OSA (obstructive sleep apnea) 03/24/2014   Mild OSA with AHI 12.4/hr now on CPAP at 10cm H2O.  Persistent oxygen desats <88% for 11 minutes on CPAP and now on nighttime O2 at 2L via CPAP   . OSA on CPAP 06/27/2014  . PONV (postoperative nausea and vomiting)    Position carefully  due to Ray-Cage back surgeries  . Right ovarian cyst 09/12/2017  . Wears glasses     Past Surgical History:  Procedure Laterality Date  . ABDOMINAL HYSTERECTOMY  1994   TVH for DUB--ovaries retained    . ANTERIOR LUMBAR FUSION    . ANTERIOR LUMBAR FUSION  2018   [facility] Dr Jinny Sanders.  2 level ALIF at L4-5 and L5-S1  . BACK SURGERY  2011   lumb cyst  . BACK SURGERY  04/18/2016   Anterior and posterior spinal fusion  . BREAST SURGERY    . CERVICAL LAMINECTOMY    . CYST REMOVAL TRUNK     LOWER BACK  . DILATION AND CURETTAGE OF UTERUS    . INSERTION OF MESH N/A 10/02/2017   Procedure: INSERTION OF MESH;  Surgeon: Michael Boston, MD;  Location: [place];  Service: General;  Laterality: N/A;  .  JOINT REPLACEMENT     partial right knee  . KNEE ARTHROSCOPY    . KNEE ARTHROSCOPY Right 05/25/2013   Procedure: RIGHT ARTHROSCOPY KNEE, PARTIAL MEDIAL MENISCECTOMY, CHONDROPLASTY, PARTIAL LATERAL MENISCECTOMY;  Surgeon: Gearlean Alf, MD;  Location: [facility];  Service: Orthopedics;  Laterality: Right;  . LAPAROSCOPIC SALPINGO OOPHERECTOMY Bilateral 10/02/2017   Procedure: LAPAROSCOPIC SALPINGO OOPHORECTOMY, CYSTOSCOPY;  Surgeon: Nunzio Cobbs, MD;  [facility];  Service: Gynecology;  Laterality: Bilateral;  . PARTIAL MASTECTOMY WITH NEEDLE LOCALIZATION Left 06/03/2013   Procedure: PARTIAL MASTECTOMY WITH NEEDLE LOCALIZATION;  Surgeon: [name], MD;  Location: [place];  Service: General;  Laterality: Left;  . POSTERIOR LUMBAR FUSION    . SINUS IRRIGATION    . TONSILLECTOMY    . TRIGGER FINGER RELEASE    . VENTRAL HERNIA REPAIR N/A 10/02/2017   Procedure: LAPAROSCOPIC VENTRAL WALL ASSISTED  HERNIA REPAIR WITH MESH;  Surgeon: Michael Boston, MD;  Location: [place];  Service: General;  Laterality: N/A;  . WISDOM TOOTH EXTRACTION      Current Outpatient Medications  Medication Sig Dispense Refill  . acetaminophen (PAIN RELIEVER EXTRA STRENGTH) 500 MG tablet Take 500 mg by mouth every 6 (six) hours as needed for moderate pain or headache.     . allopurinol (ZYLOPRIM) 100 MG tablet Take 2 tablets by mouth daily.    . calcium-vitamin D (OSCAL WITH D) 500-200 MG-UNIT per tablet Take 1 tablet by mouth daily with breakfast.     . diclofenac (VOLTAREN) 75 MG EC tablet Take 75 mg by mouth every evening.     . fexofenadine (ALLEGRA) 180 MG tablet Take 180 mg by mouth every evening.     . fish oil-omega-3 fatty acids 1000 MG capsule Take 1 g by mouth 2 (two) times daily.     Marland Kitchen gabapentin (NEURONTIN) 300 MG capsule Take 1 capsule (300 mg total) by mouth 2 (two) times daily. Increase to 4x/day as needed 60 capsule 1  .  hydrochlorothiazide (HYDRODIURIL) 25 MG tablet Take 25 mg by mouth daily    . ketotifen (ZADITOR) 0.025 % ophthalmic solution Place 1 drop into both eyes daily as needed (for dry eyes).    Marland Kitchen losartan (COZAAR) 25 MG tablet Take 25 mg by mouth every evening.    . meclizine (ANTIVERT) 25 MG tablet Take 25 mg by mouth 3 (three) times daily as needed for dizziness.     . metFORMIN (GLUCOPHAGE) 500 MG tablet Take 500 mg by mouth every evening.     . methylcellulose (CITRUCEL)  oral powder Take 1 packet by mouth daily.     . mometasone (NASONEX) 50 MCG/ACT nasal spray Place 2 sprays into the nose at bedtime.     . nebivolol (BYSTOLIC) 10 MG tablet Take 10 mg by mouth every morning.    Marland Kitchen omeprazole (PRILOSEC) 20 MG capsule Take 20 mg by mouth daily as needed (for acid reflux).     . rosuvastatin (CRESTOR) 10 MG tablet Take 1 tablet by mouth 3 times a week. 15 tablet 10   No current facility-administered medications for this visit.     Family History  Problem Relation Age of Onset  . Heart disease Mother   . Deep vein thrombosis Mother   . Colon cancer Mother   . Rectal cancer Mother   . Lung disease Mother   . Heart disease Father   . COPD Father   . Rheum arthritis Father   . Osteoarthritis Father   . Heart disease Brother   . Pulmonary embolism Brother   . Rheum arthritis Daughter     Review of Systems  Constitutional: Negative.   HENT: Negative.   Eyes: Negative.   Respiratory: Negative.   Cardiovascular: Negative.   Gastrointestinal: Negative.   Endocrine: Negative.   Genitourinary: Negative.   Musculoskeletal: Negative.   Skin: Negative.   Allergic/Immunologic: Negative.   Neurological: Negative.   Hematological: Negative.   Psychiatric/Behavioral: Negative.     Exam:   BP 126/80 (BP Location: Right Arm, Patient Position: Sitting, Cuff Size: Large)   Pulse 64   Resp 14   Ht 5' 4.75" (1.645 m)   Wt 232 lb (105.2 kg)   LMP 05/26/1993 (Within Years)   BMI 38.91 kg/m      General appearance: alert, cooperative and appears stated age Head: Normocephalic, without obvious abnormality, atraumatic Neck: no adenopathy, supple, symmetrical, trachea midline and thyroid normal to inspection and palpation Lungs: clear to auscultation bilaterally Breasts: normal appearance, no masses or tenderness, No nipple retraction or dimpling, No nipple discharge or bleeding, No axillary or supraclavicular adenopathy Heart: regular rate and rhythm Abdomen: soft, non-tender; no masses, no organomegaly Extremities: extremities normal, atraumatic, no cyanosis or edema Skin: Skin color, texture, turgor normal. No rashes or lesions Lymph nodes: Cervical, supraclavicular, and axillary nodes normal. No abnormal inguinal nodes palpated Neurologic: Grossly normal  Pelvic: External genitalia:  no lesions              Urethra:  normal appearing urethra with no masses, tenderness or lesions              Bartholins and Skenes: normal                 Vagina: normal appearing vagina with normal color and discharge, no lesions.  Almost third degree rectocele.               Cervix:  absent              Pap taken: No. Bimanual Exam:  Uterus: absent               Adnexa: no mass, fullness, tenderness              Rectal exam: Yes.  .  Confirms.              Anus:  normal sphincter tone, no lesions  Chaperone was present for exam.  Assessment:   Well woman visit with normal exam. Status post TVHfor abnormal uterine bleeding, endometriosis, and fibroids.  Status post laparoscopic BSO, LOA.   Benign serous cystadenofibroma.  Remote history of cryotherapy. Rectocele. History of atypical breast ductal hyperplasia. Status post partial left mastectomy.  Family history of rectal cancer. FH DVT/PE.  Mother with DVT and hx malignancy.  Brother with CAD at a young age.  Obesity.     Plan: Mammogram screening. Recommended self breast awareness. Pap and HR HPV as above. Guidelines for  Calcium, Vitamin D, regular exercise program including cardiovascular and weight bearing exercise. BMD ordered for [facility]. Follow up annually and prn.   Additional counseling given.  Yes.  . ___15____ minutes face to face time of which over 50% was spent in counseling regarding rectocele.  We discussed risk factors and tx options - treat constipation, pessary, pelvic floor PT and surgery.  Rectocele repair discussed and discussion or potential risks of surgery - bleeding, infection, damage to surrounding organs, reaction to anesthesia, pneumonia, DVT/PT, and recurrent of prolapse.  ACOG HOs on prolapse and surgery for prolapse to patient.  She will consider surgery and may return for more consultation.    After visit summary provided.

## 2018-08-04 ENCOUNTER — Encounter: Payer: Self-pay | Admitting: Obstetrics and Gynecology

## 2018-08-04 ENCOUNTER — Ambulatory Visit (INDEPENDENT_AMBULATORY_CARE_PROVIDER_SITE_OTHER): Payer: BC Managed Care – PPO | Admitting: Obstetrics and Gynecology

## 2018-08-04 ENCOUNTER — Other Ambulatory Visit: Payer: Self-pay

## 2018-08-04 VITALS — BP 126/80 | HR 64 | Resp 14 | Ht 64.75 in | Wt 232.0 lb

## 2018-08-04 DIAGNOSIS — Z780 Asymptomatic menopausal state: Secondary | ICD-10-CM

## 2018-08-04 DIAGNOSIS — N816 Rectocele: Secondary | ICD-10-CM

## 2018-08-04 DIAGNOSIS — Z01419 Encounter for gynecological examination (general) (routine) without abnormal findings: Secondary | ICD-10-CM | POA: Diagnosis not present

## 2018-08-04 NOTE — Patient Instructions (Signed)

## 2018-08-09 ENCOUNTER — Ambulatory Visit (AMBULATORY_SURGERY_CENTER): Payer: Self-pay | Admitting: *Deleted

## 2018-08-09 ENCOUNTER — Other Ambulatory Visit: Payer: Self-pay

## 2018-08-09 VITALS — Temp 96.7°F | Ht 65.0 in | Wt 232.0 lb

## 2018-08-09 DIAGNOSIS — Z800 Family history of malignant neoplasm of digestive organs: Secondary | ICD-10-CM

## 2018-08-09 MED ORDER — NA SULFATE-K SULFATE-MG SULF 17.5-3.13-1.6 GM/177ML PO SOLN: ORAL | 0 refills | Status: DC

## 2018-08-09 NOTE — Progress Notes (Signed)
Patient denies any allergies to eggs or soy. Patient denies any problems with anesthesia/sedation. Patient denies any oxygen use at home. Patient denies taking any diet/weight loss medications or blood thinners.  

## 2018-08-19 ENCOUNTER — Telehealth: Payer: Self-pay | Admitting: *Deleted

## 2018-08-19 NOTE — Telephone Encounter (Signed)
Notified pt of cancellation of appointment and that we will call when schedules open to reschedule.

## 2018-08-26 ENCOUNTER — Encounter: Payer: BC Managed Care – PPO | Admitting: Gastroenterology

## 2018-09-29 ENCOUNTER — Telehealth: Payer: Self-pay | Admitting: *Deleted

## 2018-09-29 NOTE — Telephone Encounter (Signed)
Called patient to reschedule previously cancelled colonoscopy due to Covid-19. Pt requests that she be contacted in July to make appointment as her husband is retiring and will be available then. Recall entered for letter to be mailed to patient in July and she is aware she will be responsible to call us to make appointment.

## 2018-10-07 ENCOUNTER — Other Ambulatory Visit: Payer: BC Managed Care – PPO

## 2018-11-12 ENCOUNTER — Encounter: Payer: Self-pay | Admitting: Gastroenterology

## 2018-11-24 ENCOUNTER — Ambulatory Visit
Admission: RE | Admit: 2018-11-24 | Discharge: 2018-11-24 | Disposition: A | Discharge status: Home / Self Care | Payer: BC Managed Care – PPO | Source: Ambulatory Visit | Attending: Obstetrics and Gynecology | Admitting: Obstetrics and Gynecology

## 2018-11-24 ENCOUNTER — Other Ambulatory Visit: Payer: Self-pay

## 2018-11-24 ENCOUNTER — Encounter: Payer: Self-pay | Admitting: Gastroenterology

## 2018-11-24 DIAGNOSIS — Z780 Asymptomatic menopausal state: Secondary | ICD-10-CM

## 2018-12-21 ENCOUNTER — Other Ambulatory Visit: Payer: Self-pay | Admitting: Cardiology

## 2018-12-21 MED ORDER — ROSUVASTATIN CALCIUM 10 MG PO TABS: ORAL_TABLET | ORAL | 1 refills | Status: DC

## 2018-12-30 ENCOUNTER — Other Ambulatory Visit: Payer: Self-pay

## 2018-12-30 ENCOUNTER — Ambulatory Visit: Payer: BC Managed Care – PPO

## 2018-12-30 VITALS — Ht 64.0 in | Wt 220.0 lb

## 2018-12-30 DIAGNOSIS — Z800 Family history of malignant neoplasm of digestive organs: Secondary | ICD-10-CM

## 2018-12-30 MED ORDER — NA SULFATE-K SULFATE-MG SULF 17.5-3.13-1.6 GM/177ML PO SOLN: 1.0000 | Freq: Once | ORAL | 0 refills | Status: AC

## 2018-12-30 NOTE — Progress Notes (Signed)
Denies allergies to eggs or soy products. Denies complication of anesthesia or sedation. Denies use of weight loss medication. Denies use of O2.   Emmi instructions given for colonoscopy.  Pre-Visit was conducted by phone due to Covid 19. Instructions were reviewed and mailed to patients confirmed home address. A 15.00 coupon for Suprep was given to the patient. Patient was encouraged to call if she had any questions regarding instructions.

## 2019-01-05 ENCOUNTER — Telehealth: Payer: Self-pay | Admitting: Gastroenterology

## 2019-01-05 NOTE — Telephone Encounter (Signed)
Answered patient questions. Pt wants [place] to know she has a rectocele per her GYN. Pt encouraged to proceed with colonoscopy as scheduled. No further questions at this time.

## 2019-01-05 NOTE — Telephone Encounter (Signed)
Pt is scheduled for 8/20 colon and has questions regarding prep instructions.

## 2019-01-12 ENCOUNTER — Telehealth: Payer: Self-pay | Admitting: Gastroenterology

## 2019-01-12 NOTE — Telephone Encounter (Signed)

## 2019-01-12 NOTE — Telephone Encounter (Signed)
Called the patient back, patient asking if she should take her Bystolic and HCTZ tomorrow. I answered yes, and not to take her Metformin. No other questions.

## 2019-01-13 ENCOUNTER — Encounter: Payer: Self-pay | Admitting: Gastroenterology

## 2019-01-13 ENCOUNTER — Other Ambulatory Visit: Payer: Self-pay

## 2019-01-13 ENCOUNTER — Ambulatory Visit (AMBULATORY_SURGERY_CENTER): Payer: BC Managed Care – PPO | Admitting: Gastroenterology

## 2019-01-13 VITALS — BP 99/63 | HR 56 | Temp 97.3°F | Resp 16 | Ht 64.0 in | Wt 220.0 lb

## 2019-01-13 DIAGNOSIS — D120 Benign neoplasm of cecum: Secondary | ICD-10-CM | POA: Diagnosis not present

## 2019-01-13 DIAGNOSIS — Z800 Family history of malignant neoplasm of digestive organs: Secondary | ICD-10-CM | POA: Diagnosis not present

## 2019-01-13 DIAGNOSIS — Z1211 Encounter for screening for malignant neoplasm of colon: Secondary | ICD-10-CM | POA: Diagnosis not present

## 2019-01-13 MED ORDER — SODIUM CHLORIDE 0.9 % IV SOLN: 500.0000 mL | Freq: Once | INTRAVENOUS | Status: DC

## 2019-01-13 NOTE — Op Note (Signed)
[place] Patient Name: Ann Fernandez Procedure Date: 01/13/2019 7:34 AM MRN: [id number] Endoscopist: Mauri Pole , MD Age: 63 Referring MD:  Date of Birth: 05-02-1956 Gender: Female Account #: [id number] Procedure:                Colonoscopy Indications:              Screening in patient at increased risk: Family                            history of 1st-degree relative with colorectal                            cancer Medicines:                Monitored Anesthesia Care Procedure:                Pre-Anesthesia Assessment:                           - Prior to the procedure, a History and Physical                            was performed, and patient medications and                            allergies were reviewed. The patient's tolerance of                            previous anesthesia was also reviewed. The risks                            and benefits of the procedure and the sedation                            options and risks were discussed with the patient.                            All questions were answered, and informed consent                            was obtained. Prior Anticoagulants: The patient has                            taken no previous anticoagulant or antiplatelet                            agents. ASA Grade Assessment: III - A patient with                            severe systemic disease. After reviewing the risks                            and benefits, the patient was deemed in  satisfactory condition to undergo the procedure.                           After obtaining informed consent, the colonoscope                            was passed under direct vision. Throughout the                            procedure, the patient's blood pressure, pulse, and                            oxygen saturations were monitored continuously. The                            Colonoscope was introduced through the anus and                          advanced to the the cecum, identified by                            appendiceal orifice and ileocecal valve. The                            colonoscopy was performed without difficulty. The                            patient tolerated the procedure well. The quality                            of the bowel preparation was excellent. The                            ileocecal valve, appendiceal orifice, and rectum                            were photographed. Scope In: 7:55:16 AM Scope Out: 8:09:45 AM Scope Withdrawal Time: 0 hours 9 minutes 21 seconds  Total Procedure Duration: 0 hours 14 minutes 29 seconds  Findings:                 The perianal and digital rectal examinations were                            normal.                           A 3 mm polyp was found in the cecum. The polyp was                            sessile. The polyp was removed with a cold snare.                            Resection and retrieval were complete.  Scattered small and large-mouthed diverticula were                            found in the sigmoid colon, descending colon,                            transverse colon and ascending colon.                           Non-bleeding internal hemorrhoids were found during                            retroflexion. The hemorrhoids were small. Complications:            No immediate complications. Estimated Blood Loss:     Estimated blood loss was minimal. Impression:               - One 3 mm polyp in the cecum, removed with a cold                            snare. Resected and retrieved.                           - Moderate diverticulosis in the sigmoid colon, in                            the descending colon, in the transverse colon and                            in the ascending colon.                           - Non-bleeding internal hemorrhoids. Recommendation:           - Patient has a contact number available for                             emergencies. The signs and symptoms of potential                            delayed complications were discussed with the                            patient. Return to normal activities tomorrow.                            Written discharge instructions were provided to the                            patient.                           - Resume previous diet.                           - Continue present medications.                           -  Await pathology results.                           - Repeat colonoscopy in 5 years for surveillance                            based on pathology results. Mauri Pole, MD 01/13/2019 8:15:27 AM This report has been signed electronically.

## 2019-01-13 NOTE — Patient Instructions (Signed)
Please read handouts provided. Await pathology results. Continue present medications.      YOU HAD AN ENDOSCOPIC PROCEDURE TODAY AT THE [facility]:   Refer to the procedure report that was given to you for any specific questions about what was found during the examination.  If the procedure report does not answer your questions, please call your gastroenterologist to clarify.  If you requested that your care partner not be given the details of your procedure findings, then the procedure report has been included in a sealed envelope for you to review at your convenience later.  YOU SHOULD EXPECT: Some feelings of bloating in the abdomen. Passage of more gas than usual.  Walking can help get rid of the air that was put into your GI tract during the procedure and reduce the bloating. If you had a lower endoscopy (such as a colonoscopy or flexible sigmoidoscopy) you may notice spotting of blood in your stool or on the toilet paper. If you underwent a bowel prep for your procedure, you may not have a normal bowel movement for a few days.  Please Note:  You might notice some irritation and congestion in your nose or some drainage.  This is from the oxygen used during your procedure.  There is no need for concern and it should clear up in a day or so.  SYMPTOMS TO REPORT IMMEDIATELY:   Following lower endoscopy (colonoscopy or flexible sigmoidoscopy):  Excessive amounts of blood in the stool  Significant tenderness or worsening of abdominal pains  Swelling of the abdomen that is new, acute  Fever of 100F or higher    For urgent or emergent issues, a gastroenterologist can be reached at any hour by calling [phone].   DIET:  We do recommend a small meal at first, but then you may proceed to your regular diet.  Drink plenty of fluids but you should avoid alcoholic beverages for 24 hours.  ACTIVITY:  You should plan to take it easy for the rest of today and you should NOT  DRIVE or use heavy machinery until tomorrow (because of the sedation medicines used during the test).    FOLLOW UP: Our staff will call the number listed on your records 48-72 hours following your procedure to check on you and address any questions or concerns that you may have regarding the information given to you following your procedure. If we do not reach you, we will leave a message.  We will attempt to reach you two times.  During this call, we will ask if you have developed any symptoms of COVID 19. If you develop any symptoms (ie: fever, flu-like symptoms, shortness of breath, cough etc.) before then, please call [phone].  If you test positive for Covid 19 in the 2 weeks post procedure, please call and report this information to us.    If any biopsies were taken you will be contacted by phone or by letter within the next 1-3 weeks.  Please call us at [phone] if you have not heard about the biopsies in 3 weeks.    SIGNATURES/CONFIDENTIALITY: You and/or your care partner have signed paperwork which will be entered into your electronic medical record.  These signatures attest to the fact that that the information above on your After Visit Summary has been reviewed and is understood.  Full responsibility of the confidentiality of this discharge information lies with you and/or your care-partner. 

## 2019-01-13 NOTE — Progress Notes (Signed)
Called to room to assist during endoscopic procedure.  Patient ID and intended procedure confirmed with present staff. Received instructions for my participation in the procedure from the performing physician.  

## 2019-01-13 NOTE — Progress Notes (Signed)
Report given to PACU, vss 

## 2019-01-17 ENCOUNTER — Telehealth: Payer: Self-pay | Admitting: *Deleted

## 2019-01-17 NOTE — Telephone Encounter (Signed)
  Follow up Call-  Call back number 01/13/2019  Post procedure Call Back phone  # [phone]  Permission to leave phone message Yes  Some recent data might be hidden     Patient questions:  Do you have a fever, pain , or abdominal swelling? No. Pain Score  0 *  Have you tolerated food without any problems? Yes.    Have you been able to return to your normal activities? Yes.    Do you have any questions about your discharge instructions: Diet   No. Medications  No. Follow up visit  No.  Do you have questions or concerns about your Care? No.  Actions: * If pain score is 4 or above: No action needed, pain <4.  1. Have you developed a fever since your procedure? no  2.   Have you had an respiratory symptoms (SOB or cough) since your procedure? no  3.   Have you tested positive for COVID 19 since your procedure no  4.   Have you had any family members/close contacts diagnosed with the COVID 19 since your procedure?  no   If yes to any of these questions please route to Joylene John, RN and Alphonsa Gin, Therapist, sports.

## 2019-01-20 ENCOUNTER — Telehealth: Payer: Self-pay | Admitting: *Deleted

## 2019-01-20 ENCOUNTER — Other Ambulatory Visit: Payer: Self-pay

## 2019-01-20 ENCOUNTER — Ambulatory Visit (INDEPENDENT_AMBULATORY_CARE_PROVIDER_SITE_OTHER): Payer: BC Managed Care – PPO | Admitting: Cardiology

## 2019-01-20 ENCOUNTER — Encounter: Payer: Self-pay | Admitting: Cardiology

## 2019-01-20 VITALS — BP 140/72 | HR 58 | Ht 65.0 in | Wt 232.0 lb

## 2019-01-20 DIAGNOSIS — E669 Obesity, unspecified: Secondary | ICD-10-CM | POA: Diagnosis not present

## 2019-01-20 DIAGNOSIS — I10 Essential (primary) hypertension: Secondary | ICD-10-CM

## 2019-01-20 DIAGNOSIS — E7849 Other hyperlipidemia: Secondary | ICD-10-CM

## 2019-01-20 NOTE — Telephone Encounter (Signed)
Per Ann Olive, LPN, pt has agreed for virtual appt 01/21/2019.     Virtual Visit Pre-Appointment Phone Call  "(Name), I am calling you today to discuss your upcoming appointment. We are currently trying to limit exposure to the virus that causes COVID-19 by seeing patients at home rather than in the office."  1. "What is the BEST phone number to call the day of the visit?" - include this in appointment notes  2. "Do you have or have access to (through a family member/friend) a smartphone with video capability that we can use for your visit?" a. If yes - list this number in appt notes as "cell" (if different from BEST phone #) and list the appointment type as a VIDEO visit in appointment notes b. If no - list the appointment type as a PHONE visit in appointment notes  3. Confirm consent - "In the setting of the current Covid19 crisis, you are scheduled for a (phone or video) visit with your provider on (date) at (time).  Just as we do with many in-office visits, in order for you to participate in this visit, we must obtain consent.  If you'd like, I can send this to your mychart (if signed up) or email for you to review.  Otherwise, I can obtain your verbal consent now.  All virtual visits are billed to your insurance company just like a normal visit would be.  By agreeing to a virtual visit, we'd like you to understand that the technology does not allow for your provider to perform an examination, and thus may limit your provider's ability to fully assess your condition. If your provider identifies any concerns that need to be evaluated in person, we will make arrangements to do so.  Finally, though the technology is pretty good, we cannot assure that it will always work on either your or our end, and in the setting of a video visit, we may have to convert it to a phone-only visit.  In either situation, we cannot ensure that we have a secure connection.  Are you willing to proceed?" STAFF: Did the  patient verbally acknowledge consent to telehealth visit? Document YES/NO here: YES (IN PERSON AT DR. Francesca Oman APPT 01/20/2019)  4. Advise patient to be prepared - "Two hours prior to your appointment, go ahead and check your blood pressure, pulse, oxygen saturation, and your weight (if you have the equipment to check those) and write them all down. When your visit starts, your provider will ask you for this information. If you have an Apple Watch or Kardia device, please plan to have heart rate information ready on the day of your appointment. Please have a pen and paper handy nearby the day of the visit as well."  5. Give patient instructions for MyChart download to smartphone OR Doximity/Doxy.me as below if video visit (depending on what platform provider is using)  6. Inform patient they will receive a phone call 15 minutes prior to their appointment time (may be from unknown caller ID) so they should be prepared to answer    TELEPHONE CALL NOTE  Ann Fernandez has been deemed a candidate for a follow-up tele-health visit to limit community exposure during the Covid-19 pandemic. I spoke with the patient via phone to ensure availability of phone/video source, confirm preferred email & phone number, and discuss instructions and expectations.  I reminded Ann Fernandez to be prepared with any vital sign and/or heart rhythm information that could potentially be obtained via  home monitoring, at the time of her visit. I reminded Ann Fernandez to expect a phone call prior to her visit.  [name], [place] 01/20/2019 3:46 PM   INSTRUCTIONS FOR DOWNLOADING THE MYCHART APP TO SMARTPHONE  - The patient must first make sure to have activated MyChart and know their login information - If Apple, go to [employer] and type in MyChart in the search bar and download the app. If Android, ask patient to go to [employer] and type in [place] in the search bar and download the app. The app is free but  as with any other app downloads, their phone may require them to verify saved payment information or Apple/Android password.  - The patient will need to then log into the app with their MyChart username and password, and select [facility] as their healthcare provider to link the account. When it is time for your visit, go to the MyChart app, find appointments, and click Begin Video Visit. Be sure to Select Allow for your device to access the Microphone and Camera for your visit. You will then be connected, and your provider will be with you shortly.  **If they have any issues connecting, or need assistance please contact MyChart service desk [phone])**  **If using a computer, in order to ensure the best quality for their visit they will need to use either of the following Internet Browsers: [employer], or Google Chrome**  IF USING DOXIMITY or DOXY.ME - The patient will receive a link just prior to their visit by text.     FULL LENGTH CONSENT FOR TELE-HEALTH VISIT   I hereby voluntarily request, consent and authorize [facility] and its employed or contracted physicians, physician assistants, nurse practitioners or other licensed health care professionals (the Practitioner), to provide me with telemedicine health care services (the "Services") as deemed necessary by the treating Practitioner. I acknowledge and consent to receive the Services by the Practitioner via telemedicine. I understand that the telemedicine visit will involve communicating with the Practitioner through live audiovisual communication technology and the disclosure of certain medical information by electronic transmission. I acknowledge that I have been given the opportunity to request an in-person assessment or other available alternative prior to the telemedicine visit and am voluntarily participating in the telemedicine visit.  I understand that I have the right to withhold or withdraw my consent to  the use of telemedicine in the course of my care at any time, without affecting my right to future care or treatment, and that the Practitioner or I may terminate the telemedicine visit at any time. I understand that I have the right to inspect all information obtained and/or recorded in the course of the telemedicine visit and may receive copies of available information for a reasonable fee.  I understand that some of the potential risks of receiving the Services via telemedicine include:  Marland Kitchen Delay or interruption in medical evaluation due to technological equipment failure or disruption; . Information transmitted may not be sufficient (e.g. poor resolution of images) to allow for appropriate medical decision making by the Practitioner; and/or  . In rare instances, security protocols could fail, causing a breach of personal health information.  Furthermore, I acknowledge that it is my responsibility to provide information about my medical history, conditions and care that is complete and accurate to the best of my ability. I acknowledge that Practitioner's advice, recommendations, and/or decision may be based on factors not within their control, such as incomplete  or inaccurate data provided by me or distortions of diagnostic images or specimens that may result from electronic transmissions. I understand that the practice of medicine is not an exact science and that Practitioner makes no warranties or guarantees regarding treatment outcomes. I acknowledge that I will receive a copy of this consent concurrently upon execution via email to the email address I last provided but may also request a printed copy by calling the office of [facility].    I understand that my insurance will be billed for this visit.   I have read or had this consent read to me. . I understand the contents of this consent, which adequately explains the benefits and risks of the Services being provided via telemedicine.  . I have  been provided ample opportunity to ask questions regarding this consent and the Services and have had my questions answered to my satisfaction. . I give my informed consent for the services to be provided through the use of telemedicine in my medical care  By participating in this telemedicine visit I agree to the above.

## 2019-01-20 NOTE — Progress Notes (Signed)
Virtual Visit via Video Note   This visit type was conducted due to national recommendations for restrictions regarding the COVID-19 Pandemic (e.g. social distancing) in an effort to limit this patient's exposure and mitigate transmission in our community.  Due to her co-morbid illnesses, this patient is at least at moderate risk for complications without adequate follow up.  This format is felt to be most appropriate for this patient at this time.  All issues noted in this document were discussed and addressed.  A limited physical exam was performed with this format.  Please refer to the patient's chart for her consent to telehealth for [facility].   Evaluation Performed:  Follow-up visit  This visit type was conducted due to national recommendations for restrictions regarding the COVID-19 Pandemic (e.g. social distancing).  This format is felt to be most appropriate for this patient at this time.  All issues noted in this document were discussed and addressed.  No physical exam was performed (except for noted visual exam findings with Video Visits).  Please refer to the patient's chart (MyChart message for video visits and phone note for telephone visits) for the patient's consent to telehealth for [facility].  Date:  01/21/2019   ID:  Ann Fernandez, DOB Jul 16, 1955, MRN [id number]  Patient Location:  Home  Provider location:   [place]  PCP:  Neale Burly, MD  Cardiologist:  Ena Dawley, MD  Sleep Medicine:  Fransico Him, MD Electrophysiologist:  None   Chief Complaint:  OSA  History of Present Illness:    Ann Fernandez is a 63 y.o. female who presents via audio/video conferencing for a telehealth visit today.    Ann Fernandez is a 63 y.o. female with a hx of mild OSA with an AHI of 12.4 events per hour. She is on CPAP at 9cm H2O.  She is doing well with her CPAP device and thinks that she has gotten used to it.  She tolerates the mask and feels the pressure is  adequate.  She uses a nasal mask.  Since going on CPAP she feels rested in the am and has no significant daytime sleepiness.  She denies any significant mouth but dose have problems with nasal dryness. She does not think that he snores.    The patient does not have symptoms concerning for COVID-19 infection (fever, chills, cough, or new shortness of breath).   Prior CV studies:   The following studies were reviewed today:  PAP compliance download  Past Medical History:  Diagnosis Date  . Acute medial meniscal tear 05/25/2013  . Allergy   . Anxiety    regarding  upcoming breast surgery  . Arthritis, degenerative 09/26/2011  . Atypical ductal hyperplasia of left breast   . 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 Breast, lumpectomy, Left - ATYPICAL DUCTAL HYPERPLASIA, SEE COMMENT. - SURGICAL MARGINS, NEGATIVE FOR ATYPIA OR MALIGNANCY. - PREVIOUS BIOPSY SITE IDENTIFIED. - ONE LYMPH NODE, NEGATIVE FOR TUMOR (0/1), 2. Breast, excision, Left medial margin - BENIGN BREAST TISSUE, SEE COMMENT. - NEGATIVE FOR ATYPIA OR MALIGNANCY.   Marland Kitchen Chest pain 01/20/2014  . DDD (degenerative disc disease)   . Diabetes mellitus without complication ([place])   . Diverticulosis of colon   . DIVERTICULOSIS, COLON 12/21/2006   Qualifier: Diagnosis of  By: Leanne Chang MD, Bruce    . DOE (dyspnea on exertion) 01/20/2014  . Encounter for long-term (current) use of other medications 09/26/2011  .  Essential hypertension 12/21/2006     Pt states she has a white coat syndrome and her BP is OK at home.     . Family history of adverse reaction to anesthesia    Anesthesia said they woke her mother up too fast and developed A-Fib .  Marland Kitchen GERD (gastroesophageal reflux disease)   . Gout   . Hearing loss   . Heart murmur 01/01/2016  . Hyperlipidemia   . HYPERLIPIDEMIA 12/21/2006   Qualifier: Diagnosis of  By: Leanne Chang MD, Bruce    . Hypertension   . IBS (irritable bowel syndrome)   .  Incarcerated incisional hernia s/p lap repair w mesh 10/02/2017 10/02/2017  . Mild sleep apnea   . Obesity (BMI 30-39.9) 06/27/2014  . OSA (obstructive sleep apnea) 03/24/2014   Mild OSA with AHI 12.4/hr now on CPAP at 10cm H2O.  Persistent oxygen desats <88% for 11 minutes on CPAP and now on nighttime O2 at 2L via CPAP   . OSA on CPAP 06/27/2014  . PONV (postoperative nausea and vomiting)    Position carefully due to Ray-Cage back surgeries  . Right ovarian cyst 09/12/2017  . Sleep apnea    On CPAP  . Wears glasses    Past Surgical History:  Procedure Laterality Date  . ABDOMINAL HYSTERECTOMY  1994   TVH for DUB--ovaries retained    . ANTERIOR LUMBAR FUSION    . ANTERIOR LUMBAR FUSION  2018   [facility] Dr Jinny Sanders.  2 level ALIF at L4-5 and L5-S1  . BACK SURGERY  2011   lumb cyst  . BACK SURGERY  04/18/2016   Anterior and posterior spinal fusion  . BREAST SURGERY    . CERVICAL LAMINECTOMY    . COLOSTOMY  09/09/2012  . CYST REMOVAL TRUNK     LOWER BACK  . DILATION AND CURETTAGE OF UTERUS    . INSERTION OF MESH N/A 10/02/2017   Procedure: INSERTION OF MESH;  Surgeon: Michael Boston, MD;  Location: [place];  Service: General;  Laterality: N/A;  . JOINT REPLACEMENT     partial right knee  . KNEE ARTHROSCOPY    . KNEE ARTHROSCOPY Right 05/25/2013   Procedure: RIGHT ARTHROSCOPY KNEE, PARTIAL MEDIAL MENISCECTOMY, CHONDROPLASTY, PARTIAL LATERAL MENISCECTOMY;  Surgeon: Gearlean Alf, MD;  Location[facility];  Service: Orthopedics;  Laterality: Right;  . LAPAROSCOPIC SALPINGO OOPHERECTOMY Bilateral 10/02/2017   Procedure: LAPAROSCOPIC SALPINGO OOPHORECTOMY, CYSTOSCOPY;  Surgeon: Nunzio Cobbs, MD;  Location: [facility];  Service: Gynecology;  Laterality: Bilateral;  . PARTIAL MASTECTOMY WITH NEEDLE LOCALIZATION Left 06/03/2013   Procedure: PARTIAL MASTECTOMY WITH NEEDLE LOCALIZATION;  Surgeon: [name], MD;  Location: [place];   Service: General;  Laterality: Left;  . POSTERIOR LUMBAR FUSION    . SINUS IRRIGATION    . TONSILLECTOMY    . TRIGGER FINGER RELEASE    . VENTRAL HERNIA REPAIR N/A 10/02/2017   Procedure: LAPAROSCOPIC VENTRAL WALL ASSISTED  HERNIA REPAIR WITH MESH;  Surgeon: Michael Boston, MD;  Location: [place];  Service: General;  Laterality: N/A;  . WISDOM TOOTH EXTRACTION       Current Meds  Medication Sig  . acetaminophen (PAIN RELIEVER EXTRA STRENGTH) 500 MG tablet Take 500 mg by mouth every 6 (six) hours as needed for moderate pain or headache.   . allopurinol (ZYLOPRIM) 100 MG tablet Take 2 tablets by mouth daily.  . calcium-vitamin D (OSCAL WITH D) 500-200 MG-UNIT per tablet  Take 1 tablet by mouth daily with breakfast.   . diclofenac (VOLTAREN) 75 MG EC tablet Take 75 mg by mouth every evening.   . fexofenadine (ALLEGRA) 180 MG tablet Take 180 mg by mouth every evening.   . fish oil-omega-3 fatty acids 1000 MG capsule Take 1 g by mouth 2 (two) times daily.   Marland Kitchen gabapentin (NEURONTIN) 300 MG capsule Take 300 mg by mouth as needed.  . hydrochlorothiazide (HYDRODIURIL) 25 MG tablet Take 25 mg by mouth daily  . losartan (COZAAR) 50 MG tablet Take 25 mg by mouth daily.  . meclizine (ANTIVERT) 25 MG tablet Take 25 mg by mouth 3 (three) times daily as needed for dizziness.   . metFORMIN (GLUCOPHAGE) 500 MG tablet Take 500 mg by mouth every evening.   . methylcellulose (CITRUCEL) oral powder Take 1 packet by mouth as needed.   . mometasone (NASONEX) 50 MCG/ACT nasal spray Place 2 sprays into the nose at bedtime.   . nebivolol (BYSTOLIC) 10 MG tablet Take 10 mg by mouth every morning.  Marland Kitchen omeprazole (PRILOSEC) 20 MG capsule Take 20 mg by mouth daily as needed (for acid reflux).   . rosuvastatin (CRESTOR) 10 MG tablet Take 1 tablet by mouth 3 times a week. Please keep upcoming appt in August with Dr. Meda Coffee for future refills. Thank you  . topiramate (TOPAMAX) 50 MG tablet Take 50 mg by  mouth as directed.     Allergies:   Demerol [meperidine], Penicillins, Sulfa antibiotics, Sulfasalazine, Triamterene, Amlodipine besylate, Arimidex [anastrozole], Lipitor [atorvastatin], Lisinopril, Propoxyphene, and Simvastatin   Social History   Tobacco Use  . Smoking status: Never Smoker  . Smokeless tobacco: Never Used  Substance Use Topics  . Alcohol use: No  . Drug use: No     Family Hx: The patient's family history includes COPD in her father; Colon cancer in her mother; Colon polyps in her father and mother; Deep vein thrombosis in her mother; Heart disease in her brother, father, and mother; Lung disease in her mother; Osteoarthritis in her father; Pulmonary embolism in her brother; Rectal cancer (age of onset: 51) in her mother; Rheum arthritis in her daughter and father. There is no history of Esophageal cancer or Stomach cancer.  ROS:   Please see the history of present illness.     All other systems reviewed and are negative.   Labs/Other Tests and Data Reviewed:    Recent Labs: No results found for requested labs within last 8760 hours.   Recent Lipid Panel Lab Results  Component Value Date/Time   CHOL 182 01/15/2018 08:57 AM   TRIG 260 (H) 01/15/2018 08:57 AM   HDL 44 01/15/2018 08:57 AM   CHOLHDL 4.1 01/15/2018 08:57 AM   CHOLHDL 4.4 10/12/2015 11:14 AM   LDLCALC 86 01/15/2018 08:57 AM   LDLDIRECT 172.8 06/29/2012 10:03 AM    Wt Readings from Last 3 Encounters:  01/21/19 229 lb (103.9 kg)  01/20/19 232 lb (105.2 kg)  01/13/19 220 lb (99.8 kg)     Objective:    Vital Signs:  BP (!) 141/73   Pulse (!) 58   Ht 5\' 5"  (1.651 m)   Wt 229 lb (103.9 kg)   LMP 05/26/1993 (Within Years)   BMI 38.11 kg/m    CONSTITUTIONAL:  Well nourished, well developed female in no acute distress.  EYES: anicteric MOUTH: oral mucosa is pink RESPIRATORY: Normal respiratory effort, symmetric expansion CARDIOVASCULAR: No peripheral edema SKIN: No rash, lesions or  ulcers MUSCULOSKELETAL: no  digital cyanosis NEURO: Cranial Nerves II-XII grossly intact, moves all extremities PSYCH: Intact judgement and insight.  A&O x 3, Mood/affect appropriate   ASSESSMENT & PLAN:    1.  OSA - The patient is tolerating PAP therapy well without any problems. The PAP download was reviewed today and showed an AHI of 0.8/hr on 8 cm H2O with 93% compliance in using more than 4 hours nightly.  The patient has been using and benefiting from PAP use and will continue to benefit from therapy.   2.  Hypertension - her Bp is controlled.  She will continue on Bystolic 10mg  daily, Losartan 25mg  daily and HCTZ 25mg  daily. She has lab work scheduled next week.   3.  Obesity - I have encouraged her to increase her walking program.  I have also encouraged her to try to cut back on carbs.    COVID-19 Education: The signs and symptoms of COVID-19 were discussed with the patient and how to seek care for testing (follow up with PCP or arrange E-visit).  The importance of social distancing was discussed today.  Patient Risk:   After full review of this patient's clinical status, I feel that they are at least moderate risk at this time.  Time:   Today, I have spent 20 minutes directly with the patient on telemedicine discussing medical problems including OSA, HTN, obesity.  We also reviewed the symptoms of COVID 19 and the ways to protect against contracting the virus with telehealth technology.  I spent an additional 5 minutes reviewing patient's chart including PAP compliance download.  Medication Adjustments/Labs and Tests Ordered: Current medicines are reviewed at length with the patient today.  Concerns regarding medicines are outlined above.  Tests Ordered: No orders of the defined types were placed in this encounter.  Medication Changes: No orders of the defined types were placed in this encounter.   Disposition:  Follow up in 1 year(s)  Signed, Fransico Him, MD  01/21/2019  9:33 AM    [facility]

## 2019-01-20 NOTE — Progress Notes (Signed)
Cardiology Office Note:    Date:  01/20/2019   ID:  Ann Fernandez, DOB 1955-06-27, MRN [id number]  PCP:  Neale Burly, MD  Cardiologist:  Ena Dawley, MD    Referring MD: Neale Burly, MD   Reason for visit: 1 year follow up   History of Present Illness:    Ann Fernandez is a 63 y.o. female with a hx of mild OSA , hypertension, hyperlipidemia and obesity who is coming for a follow up. She is asymptomatic, denies any chest pain or SOB, she is walking frequently, she has gained significant amount of weight in the last 10 years despite eating healthy and monitoring food portions. She was prescribed Topamax by her PCP but wanted to make sure its ok from cardiac standpoint.  Past Medical History:  Diagnosis Date  . Acute medial meniscal tear 05/25/2013  . Allergy   . Anxiety    regarding  upcoming breast surgery  . Arthritis, degenerative 09/26/2011  . Atypical ductal hyperplasia of left breast   . 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 Breast, lumpectomy, Left - ATYPICAL DUCTAL HYPERPLASIA, SEE COMMENT. - SURGICAL MARGINS, NEGATIVE FOR ATYPIA OR MALIGNANCY. - PREVIOUS BIOPSY SITE IDENTIFIED. - ONE LYMPH NODE, NEGATIVE FOR TUMOR (0/1), 2. Breast, excision, Left medial margin - BENIGN BREAST TISSUE, SEE COMMENT. - NEGATIVE FOR ATYPIA OR MALIGNANCY.   Marland Kitchen Chest pain 01/20/2014  . DDD (degenerative disc disease)   . Diabetes mellitus without complication ([place])   . Diverticulosis of colon   . DIVERTICULOSIS, COLON 12/21/2006   Qualifier: Diagnosis of  By: Leanne Chang MD, Bruce    . DOE (dyspnea on exertion) 01/20/2014  . Encounter for long-term (current) use of other medications 09/26/2011  . Essential hypertension 12/21/2006     Pt states she has a white coat syndrome and her BP is OK at home.     . Family history of adverse reaction to anesthesia    Anesthesia said they woke her mother up too fast and developed A-Fib .  Marland Kitchen GERD  (gastroesophageal reflux disease)   . Gout   . Hearing loss   . Heart murmur 01/01/2016  . Hyperlipidemia   . HYPERLIPIDEMIA 12/21/2006   Qualifier: Diagnosis of  By: Leanne Chang MD, Bruce    . Hypertension   . IBS (irritable bowel syndrome)   . Incarcerated incisional hernia s/p lap repair w mesh 10/02/2017 10/02/2017  . Mild sleep apnea   . Obesity (BMI 30-39.9) 06/27/2014  . OSA (obstructive sleep apnea) 03/24/2014   Mild OSA with AHI 12.4/hr now on CPAP at 10cm H2O.  Persistent oxygen desats <88% for 11 minutes on CPAP and now on nighttime O2 at 2L via CPAP   . OSA on CPAP 06/27/2014  . PONV (postoperative nausea and vomiting)    Position carefully due to Ray-Cage back surgeries  . Right ovarian cyst 09/12/2017  . Sleep apnea    On CPAP  . Wears glasses     Past Surgical History:  Procedure Laterality Date  . ABDOMINAL HYSTERECTOMY  1994   TVH for DUB--ovaries retained    . ANTERIOR LUMBAR FUSION    . ANTERIOR LUMBAR FUSION  2018   [facility] Dr Jinny Sanders.  2 level ALIF at L4-5 and L5-S1  . BACK SURGERY  2011   lumb cyst  . BACK SURGERY  04/18/2016   Anterior and posterior spinal fusion  . BREAST SURGERY    . CERVICAL LAMINECTOMY    .  COLOSTOMY  09/09/2012  . CYST REMOVAL TRUNK     LOWER BACK  . DILATION AND CURETTAGE OF UTERUS    . INSERTION OF MESH N/A 10/02/2017   Procedure: INSERTION OF MESH;  Surgeon: Michael Boston, MD;  Location: [place];  Service: General;  Laterality: N/A;  . JOINT REPLACEMENT     partial right knee  . KNEE ARTHROSCOPY    . KNEE ARTHROSCOPY Right 05/25/2013   Procedure: RIGHT ARTHROSCOPY KNEE, PARTIAL MEDIAL MENISCECTOMY, CHONDROPLASTY, PARTIAL LATERAL MENISCECTOMY;  Surgeon: Gearlean Alf, MD;  [facility];  Service: Orthopedics;  Laterality: Right;  . LAPAROSCOPIC SALPINGO OOPHERECTOMY Bilateral 10/02/2017   Procedure: LAPAROSCOPIC SALPINGO OOPHORECTOMY, CYSTOSCOPY;  Surgeon: Nunzio Cobbs, MD;  Location: [facility];  Service: Gynecology;  Laterality: Bilateral;  . PARTIAL MASTECTOMY WITH NEEDLE LOCALIZATION Left 06/03/2013   Procedure: PARTIAL MASTECTOMY WITH NEEDLE LOCALIZATION;  Surgeon: [name], MD;  Location: [place];  Service: General;  Laterality: Left;  . POSTERIOR LUMBAR FUSION    . SINUS IRRIGATION    . TONSILLECTOMY    . TRIGGER FINGER RELEASE    . VENTRAL HERNIA REPAIR N/A 10/02/2017   Procedure: LAPAROSCOPIC VENTRAL WALL ASSISTED  HERNIA REPAIR WITH MESH;  Surgeon: Michael Boston, MD;  Location: [place];  Service: General;  Laterality: N/A;  . WISDOM TOOTH EXTRACTION      Current Medications: Current Meds  Medication Sig  . acetaminophen (PAIN RELIEVER EXTRA STRENGTH) 500 MG tablet Take 500 mg by mouth every 6 (six) hours as needed for moderate pain or headache.   . allopurinol (ZYLOPRIM) 100 MG tablet Take 2 tablets by mouth daily.  . calcium-vitamin D (OSCAL WITH D) 500-200 MG-UNIT per tablet Take 1 tablet by mouth daily with breakfast.   . diclofenac (VOLTAREN) 75 MG EC tablet Take 75 mg by mouth every evening.   . fexofenadine (ALLEGRA) 180 MG tablet Take 180 mg by mouth every evening.   . fish oil-omega-3 fatty acids 1000 MG capsule Take 1 g by mouth 2 (two) times daily.   Marland Kitchen gabapentin (NEURONTIN) 300 MG capsule Take 300 mg by mouth as needed.  . hydrochlorothiazide (HYDRODIURIL) 25 MG tablet Take 25 mg by mouth daily  . losartan (COZAAR) 50 MG tablet Take 25 mg by mouth daily.  . meclizine (ANTIVERT) 25 MG tablet Take 25 mg by mouth 3 (three) times daily as needed for dizziness.   . metFORMIN (GLUCOPHAGE) 500 MG tablet Take 500 mg by mouth every evening.   . methylcellulose (CITRUCEL) oral powder Take 1 packet by mouth as needed.   . mometasone (NASONEX) 50 MCG/ACT nasal spray Place 2 sprays into the nose at bedtime.   . nebivolol (BYSTOLIC) 10 MG tablet Take 10 mg by mouth every morning.  Marland Kitchen omeprazole (PRILOSEC) 20 MG capsule Take  20 mg by mouth daily as needed (for acid reflux).   . rosuvastatin (CRESTOR) 10 MG tablet Take 1 tablet by mouth 3 times a week. Please keep upcoming appt in August with Dr. Meda Coffee for future refills. Thank you     Allergies:   Demerol [meperidine], Penicillins, Sulfa antibiotics, Sulfasalazine, Triamterene, Amlodipine besylate, Arimidex [anastrozole], Lipitor [atorvastatin], Lisinopril, Propoxyphene, and Simvastatin   Social History   Socioeconomic History  . Marital status: Married    Spouse name: Not on file  . Number of children: Not on file  . Years of education: Not on file  . Highest education level:  Not on file  Occupational History  . Not on file  Social Needs  . Financial resource strain: Not on file  . Food insecurity    Worry: Not on file    Inability: Not on file  . Transportation needs    Medical: Not on file    Non-medical: Not on file  Tobacco Use  . Smoking status: Never Smoker  . Smokeless tobacco: Never Used  Substance and Sexual Activity  . Alcohol use: No  . Drug use: No  . Sexual activity: Yes    Partners: Male    Comment: TVH  Lifestyle  . Physical activity    Days per week: Not on file    Minutes per session: Not on file  . Stress: Not on file  Relationships  . Social Herbalist on phone: Not on file    Gets together: Not on file    Attends religious service: Not on file    Active member of club or organization: Not on file    Attends meetings of clubs or organizations: Not on file    Relationship status: Not on file  Other Topics Concern  . Not on file  Social History Narrative  . Not on file     Family History: The patient's family history includes COPD in her father; Colon cancer in her mother; Colon polyps in her father and mother; Deep vein thrombosis in her mother; Heart disease in her brother, father, and mother; Lung disease in her mother; Osteoarthritis in her father; Pulmonary embolism in her brother; Rectal cancer (age  of onset: 23) in her mother; Rheum arthritis in her daughter and father. There is no history of Esophageal cancer or Stomach cancer.  ROS:   Please see the history of present illness.    ROS  All other systems reviewed and negative.   EKGs/Labs/Other Studies Reviewed:    The following studies were reviewed today: PAP download  EKG:  EKG is not ordered today.    Recent Labs: No results found for requested labs within last 8760 hours.   Recent Lipid Panel    Component Value Date/Time   CHOL 182 01/15/2018 0857   TRIG 260 (H) 01/15/2018 0857   HDL 44 01/15/2018 0857   CHOLHDL 4.1 01/15/2018 0857   CHOLHDL 4.4 10/12/2015 1114   VLDL 44 (H) 10/12/2015 1114   LDLCALC 86 01/15/2018 0857   LDLDIRECT 172.8 06/29/2012 1003    Physical Exam:    VS:  BP 140/72   Pulse (!) 58   Ht 5' 5" (1.651 m)   Wt 232 lb (105.2 kg)   LMP 05/26/1993 (Within Years)   SpO2 95%   BMI 38.61 kg/m     Wt Readings from Last 3 Encounters:  01/20/19 232 lb (105.2 kg)  01/13/19 220 lb (99.8 kg)  12/30/18 220 lb (99.8 kg)     GEN:  Well nourished, well developed in no acute distress HEENT: Normal NECK: No JVD; No carotid bruits LYMPHATICS: No lymphadenopathy CARDIAC: RRR, no murmurs, rubs, gallops RESPIRATORY:  Clear to auscultation without rales, wheezing or rhonchi  ABDOMEN: Soft, non-tender, non-distended MUSCULOSKELETAL:  No edema; No deformity  SKIN: Warm and dry NEUROLOGIC:  Alert and oriented x 3 PSYCHIATRIC:  Normal affect   ASSESSMENT:    1. Other hyperlipidemia   2. Benign essential HTN   3. Obesity, unspecified classification, unspecified obesity type, unspecified whether serious comorbidity present      PLAN:    In  order of problems listed above:  1.  Obesity - I am ok with Topamax, it has rare cardiac side effects. She is advised to continue walking 5x/week for 30 minutes. She is motivated.   2. Hypertension - again borderline, but controlled at home, I will continue  the same regimen as I anticipate for her to decrease her BP with weight loss and exercise.  3. Hyperlipidemia - we will repeat labs tomorrow.  4. OSA followed by Dr Radford Pax.   Follow up in 1 year.  Medication Adjustments/Labs and Tests Ordered: Current medicines are reviewed at length with the patient today.  Concerns regarding medicines are outlined above.  Orders Placed This Encounter  Procedures  . Lipid Profile  . Comp Met (CMET)  . CBC w/Diff  . TSH  . EKG 12-Lead   No orders of the defined types were placed in this encounter.  Signed, Ena Dawley, MD  01/20/2019 3:27 PM    [place]

## 2019-01-20 NOTE — Patient Instructions (Addendum)
Medication Instructions:    Your physician recommends that you continue on your current medications as directed. Please refer to the Current Medication list given to you today.  If you need a refill on your cardiac medications before your next appointment, please call your pharmacy.    Lab work:  IN THE VERY NEAR FUTURE TO CHECK --CMET, CBC W DIFF, TSH, AND LIPIDS--PLEASE COME FASTING TO THIS LAB APPOINTMENT.  If you have labs (blood work) drawn today and your tests are completely normal, you will receive your results only by: Marland Kitchen MyChart Message (if you have MyChart) OR . A paper copy in the mail If you have any lab test that is abnormal or we need to change your treatment, we will call you to review the results.    Follow-Up: At [facility], you and your health needs are our priority.  As part of our continuing mission to provide you with exceptional heart care, we have created designated Provider Care Teams.  These Care Teams include your primary Cardiologist (physician) and Advanced Practice Providers (APPs -  Physician Assistants and Nurse Practitioners) who all work together to provide you with the care you need, when you need it. You will need a follow up appointment in 12 months.  Please call our office 2 months in advance to schedule this appointment.  You may see Ena Dawley, MD or one of the following Advanced Practice Providers on your designated Care Team:   [place], PA-C Melina Copa, PA-C . Ermalinda Barrios, PA-C   YOUR APPOINTMENT WITH DR TURNER FOR SLEEP TOMORROW AT 01/21/19 AT 9:20 AM WILL NOW BE A TELEPHONE/VIDEO VIRTUAL VISIT.

## 2019-01-21 ENCOUNTER — Encounter: Payer: Self-pay | Admitting: Gastroenterology

## 2019-01-21 ENCOUNTER — Telehealth (INDEPENDENT_AMBULATORY_CARE_PROVIDER_SITE_OTHER): Payer: BC Managed Care – PPO | Admitting: Cardiology

## 2019-01-21 VITALS — BP 141/73 | HR 58 | Ht 65.0 in | Wt 229.0 lb

## 2019-01-21 DIAGNOSIS — I10 Essential (primary) hypertension: Secondary | ICD-10-CM | POA: Diagnosis not present

## 2019-01-21 DIAGNOSIS — E669 Obesity, unspecified: Secondary | ICD-10-CM

## 2019-01-21 DIAGNOSIS — G4733 Obstructive sleep apnea (adult) (pediatric): Secondary | ICD-10-CM

## 2019-01-21 NOTE — Patient Instructions (Signed)
Medication Instructions:  The current medical regimen is effective;  continue present plan and medications.  If you need a refill on your cardiac medications before your next appointment, please call your pharmacy.   Follow-Up: Follow up in 1 year with Dr. Radford Pax.  You will receive a letter in the mail 2 months before you are due.  Please call us when you receive this letter to schedule your follow up appointment.  Thank you for choosing [facility]!!

## 2019-01-25 ENCOUNTER — Other Ambulatory Visit: Payer: Self-pay

## 2019-01-25 ENCOUNTER — Other Ambulatory Visit: Payer: BC Managed Care – PPO

## 2019-01-25 DIAGNOSIS — I10 Essential (primary) hypertension: Secondary | ICD-10-CM

## 2019-01-25 DIAGNOSIS — E669 Obesity, unspecified: Secondary | ICD-10-CM

## 2019-01-25 DIAGNOSIS — E7849 Other hyperlipidemia: Secondary | ICD-10-CM

## 2019-01-25 LAB — CBC WITH DIFFERENTIAL/PLATELET
Basophils Absolute: 0 10*3/uL (ref 0.0–0.2)
Basos: 1 %
EOS (ABSOLUTE): 0.3 10*3/uL (ref 0.0–0.4)
Eos: 4 %
Hematocrit: 42.5 % (ref 34.0–46.6)
Hemoglobin: 14.2 g/dL (ref 11.1–15.9)
Immature Grans (Abs): 0 10*3/uL (ref 0.0–0.1)
Immature Granulocytes: 1 %
Lymphocytes Absolute: 1.5 10*3/uL (ref 0.7–3.1)
Lymphs: 23 %
MCH: 30.7 pg (ref 26.6–33.0)
MCHC: 33.4 g/dL (ref 31.5–35.7)
MCV: 92 fL (ref 79–97)
Monocytes Absolute: 0.5 10*3/uL (ref 0.1–0.9)
Monocytes: 8 %
Neutrophils Absolute: 3.9 10*3/uL (ref 1.4–7.0)
Neutrophils: 63 %
Platelets: 169 10*3/uL (ref 150–450)
RBC: 4.63 x10E6/uL (ref 3.77–5.28)
RDW: 13.1 % (ref 11.7–15.4)
WBC: 6.2 10*3/uL (ref 3.4–10.8)

## 2019-01-25 LAB — COMPREHENSIVE METABOLIC PANEL
ALT: 57 IU/L — ABNORMAL HIGH (ref 0–32)
AST: 44 IU/L — ABNORMAL HIGH (ref 0–40)
Albumin/Globulin Ratio: 2.6 — ABNORMAL HIGH (ref 1.2–2.2)
Albumin: 4.5 g/dL (ref 3.8–4.8)
Alkaline Phosphatase: 60 IU/L (ref 39–117)
BUN/Creatinine Ratio: 14 (ref 12–28)
BUN: 10 mg/dL (ref 8–27)
Bilirubin Total: 0.7 mg/dL (ref 0.0–1.2)
CO2: 23 mmol/L (ref 20–29)
Calcium: 9.6 mg/dL (ref 8.7–10.3)
Chloride: 101 mmol/L (ref 96–106)
Creatinine, Ser: 0.74 mg/dL (ref 0.57–1.00)
GFR calc Af Amer: 100 mL/min/{1.73_m2} (ref >59)
GFR calc non Af Amer: 87 mL/min/{1.73_m2} (ref >59)
Globulin, Total: 1.7 g/dL (ref 1.5–4.5)
Glucose: 103 mg/dL — ABNORMAL HIGH (ref 65–99)
Potassium: 4 mmol/L (ref 3.5–5.2)
Sodium: 139 mmol/L (ref 134–144)
Total Protein: 6.2 g/dL (ref 6.0–8.5)

## 2019-01-25 LAB — LIPID PANEL
Chol/HDL Ratio: 4.4 ratio (ref 0.0–4.4)
Cholesterol, Total: 158 mg/dL (ref 100–199)
HDL: 36 mg/dL — ABNORMAL LOW (ref >39)
LDL Chol Calc (NIH): 87 mg/dL (ref 0–99)
Triglycerides: 204 mg/dL — ABNORMAL HIGH (ref 0–149)
VLDL Cholesterol Cal: 35 mg/dL (ref 5–40)

## 2019-01-25 LAB — TSH: TSH: 2.85 u[IU]/mL (ref 0.450–4.500)

## 2019-02-02 ENCOUNTER — Telehealth: Payer: Self-pay | Admitting: *Deleted

## 2019-02-02 DIAGNOSIS — R7989 Other specified abnormal findings of blood chemistry: Secondary | ICD-10-CM

## 2019-02-02 NOTE — Telephone Encounter (Signed)
-----   Message from Nuala Alpha, LPN sent at [phone]  1:23 PM EDT -----  ----- Message ----- From: Dorothy Spark, MD Sent: 01/26/2019   1:06 PM EDT To: Nuala Alpha, LPN  Improved lipids, normal thyroid function, mildly elevated LFTs, I would repeat CMP in 2 months

## 2019-02-02 NOTE — Telephone Encounter (Signed)
Pt has been notified of lab results by phone with verbal understanding. Pt agreeable to repeat CMP in 2 months, labs scheduled for 04/05/19. Pt thanked me for the call. Patient notified of result.  Please refer to phone note from today for complete details.   Julaine Hua, [facility] 02/02/2019 12:32 PM

## 2019-03-10 ENCOUNTER — Other Ambulatory Visit: Payer: Self-pay | Admitting: Cardiology

## 2019-03-10 MED ORDER — ROSUVASTATIN CALCIUM 10 MG PO TABS: ORAL_TABLET | ORAL | 9 refills | Status: DC

## 2019-04-05 ENCOUNTER — Other Ambulatory Visit: Payer: Self-pay

## 2019-04-05 ENCOUNTER — Other Ambulatory Visit: Payer: BC Managed Care – PPO

## 2019-04-05 DIAGNOSIS — R7989 Other specified abnormal findings of blood chemistry: Secondary | ICD-10-CM

## 2019-04-05 LAB — COMPREHENSIVE METABOLIC PANEL
ALT: 47 IU/L — ABNORMAL HIGH (ref 0–32)
AST: 34 IU/L (ref 0–40)
Albumin/Globulin Ratio: 2.3 — ABNORMAL HIGH (ref 1.2–2.2)
Albumin: 4.4 g/dL (ref 3.8–4.8)
Alkaline Phosphatase: 70 IU/L (ref 39–117)
BUN/Creatinine Ratio: 18 (ref 12–28)
BUN: 13 mg/dL (ref 8–27)
Bilirubin Total: 0.8 mg/dL (ref 0.0–1.2)
CO2: 24 mmol/L (ref 20–29)
Calcium: 9.9 mg/dL (ref 8.7–10.3)
Chloride: 102 mmol/L (ref 96–106)
Creatinine, Ser: 0.73 mg/dL (ref 0.57–1.00)
GFR calc Af Amer: 101 mL/min/{1.73_m2} (ref >59)
GFR calc non Af Amer: 88 mL/min/{1.73_m2} (ref >59)
Globulin, Total: 1.9 g/dL (ref 1.5–4.5)
Glucose: 108 mg/dL — ABNORMAL HIGH (ref 65–99)
Potassium: 4.3 mmol/L (ref 3.5–5.2)
Sodium: 141 mmol/L (ref 134–144)
Total Protein: 6.3 g/dL (ref 6.0–8.5)

## 2019-07-04 ENCOUNTER — Encounter: Payer: Self-pay | Admitting: Obstetrics and Gynecology

## 2019-08-09 ENCOUNTER — Other Ambulatory Visit: Payer: Self-pay

## 2019-08-09 ENCOUNTER — Encounter: Payer: Self-pay | Admitting: Obstetrics and Gynecology

## 2019-08-09 ENCOUNTER — Ambulatory Visit (INDEPENDENT_AMBULATORY_CARE_PROVIDER_SITE_OTHER): Payer: BC Managed Care – PPO | Admitting: Obstetrics and Gynecology

## 2019-08-09 VITALS — BP 138/78 | HR 64 | Temp 97.9°F | Resp 10 | Ht 64.25 in | Wt 233.0 lb

## 2019-08-09 DIAGNOSIS — Z01419 Encounter for gynecological examination (general) (routine) without abnormal findings: Secondary | ICD-10-CM | POA: Diagnosis not present

## 2019-08-09 DIAGNOSIS — N816 Rectocele: Secondary | ICD-10-CM | POA: Diagnosis not present

## 2019-08-09 NOTE — Progress Notes (Signed)
64 y.o. [id number] Married Caucasian female here for annual exam.    Some urinary leakage if she cannot get to the bathroom quickly enough.  This does not occur all the time.  Seldom has just a little bit of leakage with a cough, laugh, or sneeze.   Had a colonoscopy in July.  Rectocele bothers her sometimes where she has difficulty emptying this.  No fecal incontinence.  She is interested in surgical repair.  Trying to loose weight.  High fiber diet.  Walking more.    Had her first Covid vaccine yesterday.   PCP: Samul Dada. Hasanaj, MD    Patient's last menstrual period was 05/26/1993 (within years).           Sexually active: Yes.    The current method of family planning is status post hysterectomy.    Exercising: Yes.    walking, weights Smoker:  no  Health Maintenance: Pap: 2009 Neg History of abnormal Pap:  Yes,States hx of cryotherapy MMG:  2 8/21 - BI-RADS1, cat A density -  [facility]  Colonoscopy:  01-13-19 polyp;next due 12/2023 BMD: 11-24-18  Result : Normal TDaP: 06-25-16 Gardasil:   no [id number] blood in past Hep C: Donated blood in past Screening Labs:  PCP and Cardiologist   reports that she has never smoked. She has never used smokeless tobacco. She reports that she does not drink alcohol or use drugs.  Past Medical History:  Diagnosis Date  . Acute medial meniscal tear 05/25/2013  . Allergy   . Anxiety    regarding  upcoming breast surgery  . Arthritis, degenerative 09/26/2011  . Atypical ductal hyperplasia of left breast   . 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 Breast, lumpectomy, Left - ATYPICAL DUCTAL HYPERPLASIA, SEE COMMENT. - SURGICAL MARGINS, NEGATIVE FOR ATYPIA OR MALIGNANCY. - PREVIOUS BIOPSY SITE IDENTIFIED. - ONE LYMPH NODE, NEGATIVE FOR TUMOR (0/1), 2. Breast, excision, Left medial margin - BENIGN BREAST TISSUE, SEE COMMENT. - NEGATIVE FOR ATYPIA OR MALIGNANCY.   Marland Kitchen Chest pain 01/20/2014  . DDD  (degenerative disc disease)   . Diabetes mellitus without complication ([place])   . Diverticulosis of colon   . DIVERTICULOSIS, COLON 12/21/2006   Qualifier: Diagnosis of  By: Leanne Chang MD, Bruce    . DOE (dyspnea on exertion) 01/20/2014  . Encounter for long-term (current) use of other medications 09/26/2011  . Essential hypertension 12/21/2006     Pt states she has a white coat syndrome and her BP is OK at home.     . Family history of adverse reaction to anesthesia    Anesthesia said they woke her mother up too fast and developed A-Fib .  Marland Kitchen GERD (gastroesophageal reflux disease)   . Gout   . Hearing loss   . Heart murmur 01/01/2016  . Hyperlipidemia   . HYPERLIPIDEMIA 12/21/2006   Qualifier: Diagnosis of  By: Leanne Chang MD, Bruce    . Hypertension   . IBS (irritable bowel syndrome)   . Incarcerated incisional hernia s/p lap repair w mesh 10/02/2017 10/02/2017  . Mild sleep apnea   . Obesity (BMI 30-39.9) 06/27/2014  . OSA (obstructive sleep apnea) 03/24/2014   Mild OSA with AHI 12.4/hr now on CPAP at 10cm H2O.  Persistent oxygen desats <88% for 11 minutes on CPAP and now on nighttime O2 at 2L via CPAP   . OSA on CPAP 06/27/2014  . PONV (postoperative nausea and vomiting)    Position carefully due to  Ray-Cage back surgeries  . Right ovarian cyst 09/12/2017  . Sleep apnea    On CPAP  . Wears glasses     Past Surgical History:  Procedure Laterality Date  . ABDOMINAL HYSTERECTOMY  1994   TVH for DUB--ovaries retained    . ANTERIOR LUMBAR FUSION    . ANTERIOR LUMBAR FUSION  2018   [facility] Dr Jinny Sanders.  2 level ALIF at L4-5 and L5-S1  . BACK SURGERY  2011   lumb cyst  . BACK SURGERY  04/18/2016   Anterior and posterior spinal fusion  . BREAST SURGERY    . CERVICAL LAMINECTOMY    . COLOSTOMY  09/09/2012  . CYST REMOVAL TRUNK     LOWER BACK  . DILATION AND CURETTAGE OF UTERUS    . INSERTION OF MESH N/A 10/02/2017   Procedure: INSERTION OF MESH;  Surgeon: Michael Boston, MD;  Location: [facility];  Service: General;  Laterality: N/A;  . JOINT REPLACEMENT     partial right knee  . KNEE ARTHROSCOPY    . KNEE ARTHROSCOPY Right 05/25/2013   Procedure: RIGHT ARTHROSCOPY KNEE, PARTIAL MEDIAL MENISCECTOMY, CHONDROPLASTY, PARTIAL LATERAL MENISCECTOMY;  Surgeon: Gearlean Alf, MD;  Location: [facility];  Service: Orthopedics;  Laterality: Right;  . LAPAROSCOPIC SALPINGO OOPHERECTOMY Bilateral 10/02/2017   Procedure: LAPAROSCOPIC SALPINGO OOPHORECTOMY, CYSTOSCOPY;  Surgeon: Nunzio Cobbs, MD;  Location[facility];  Service: Gynecology;  Laterality: Bilateral;  . PARTIAL MASTECTOMY WITH NEEDLE LOCALIZATION Left 06/03/2013   Procedure: PARTIAL MASTECTOMY WITH NEEDLE LOCALIZATION;  Surgeon: [name], MD;  Location: [place];  Service: General;  Laterality: Left;  . POSTERIOR LUMBAR FUSION    . SINUS IRRIGATION    . TONSILLECTOMY    . TRIGGER FINGER RELEASE    . VENTRAL HERNIA REPAIR N/A 10/02/2017   Procedure: LAPAROSCOPIC VENTRAL WALL ASSISTED  HERNIA REPAIR WITH MESH;  Surgeon: Michael Boston, MD;  Location: [place];  Service: General;  Laterality: N/A;  . WISDOM TOOTH EXTRACTION      Current Outpatient Medications  Medication Sig Dispense Refill  . acetaminophen (PAIN RELIEVER EXTRA STRENGTH) 500 MG tablet Take 500 mg by mouth every 6 (six) hours as needed for moderate pain or headache.     . allopurinol (ZYLOPRIM) 100 MG tablet Take 2 tablets by mouth daily.    . betamethasone dipropionate 0.05 % cream APPLY TO AFFECTED AREA(S)OON SKIN TWICE DAILY AS NEEDED.    . calcium-vitamin D (OSCAL WITH D) 500-200 MG-UNIT per tablet Take 1 tablet by mouth daily with breakfast.     . diclofenac (VOLTAREN) 75 MG EC tablet Take 75 mg by mouth every evening.     . fexofenadine (ALLEGRA) 180 MG tablet Take 180 mg by mouth every evening.     . fish oil-omega-3 fatty acids 1000 MG capsule Take 1 g by mouth 2 (two) times daily.      . fluticasone (FLONASE) 50 MCG/ACT nasal spray as needed.    . gabapentin (NEURONTIN) 300 MG capsule Take 300 mg by mouth as needed.    . hydrochlorothiazide (HYDRODIURIL) 25 MG tablet Take 25 mg by mouth daily    . losartan (COZAAR) 50 MG tablet Take 25 mg by mouth daily.    . meclizine (ANTIVERT) 25 MG tablet Take 25 mg by mouth 3 (three) times daily as needed for dizziness.     . metFORMIN (GLUCOPHAGE) 500 MG tablet Take 500 mg by mouth every  evening.     . methylcellulose (CITRUCEL) oral powder Take 1 packet by mouth as needed.     . mometasone (NASONEX) 50 MCG/ACT nasal spray Place 2 sprays into the nose at bedtime.     . nebivolol (BYSTOLIC) 10 MG tablet Take 10 mg by mouth every morning.    Marland Kitchen omeprazole (PRILOSEC) 20 MG capsule Take 20 mg by mouth daily as needed (for acid reflux).     . rosuvastatin (CRESTOR) 10 MG tablet Take 1 tablet by mouth 3 times a week. 15 tablet 9  . topiramate (TOPAMAX) 50 MG tablet Take 50 mg by mouth as directed.     No current facility-administered medications for this visit.    Family History  Problem Relation Age of Onset  . Heart disease Mother   . Deep vein thrombosis Mother   . Colon cancer Mother   . Rectal cancer Mother 5  . Lung disease Mother   . Colon polyps Mother   . Heart disease Father   . COPD Father   . Rheum arthritis Father   . Osteoarthritis Father   . Colon polyps Father   . Heart disease Brother   . Pulmonary embolism Brother   . Rheum arthritis Daughter   . Esophageal cancer Neg Hx   . Stomach cancer Neg Hx     Review of Systems  Constitutional: Negative.   HENT: Negative.   Eyes: Negative.   Respiratory: Negative.   Cardiovascular: Negative.   Gastrointestinal: Negative.   Endocrine: Negative.   Genitourinary: Negative.   Musculoskeletal: Negative.   Skin: Negative.   Allergic/Immunologic: Negative.   Neurological: Negative.   Hematological: Negative.   Psychiatric/Behavioral: Negative.     Exam:    BP 138/78 (BP Location: Right Arm, Patient Position: Sitting, Cuff Size: Large)   Pulse 64   Temp 97.9 F (36.6 C) (Temporal)   Resp 10   Ht 5' 4.25" (1.632 m)   Wt 233 lb (105.7 kg)   LMP 05/26/1993 (Within Years)   BMI 39.68 kg/m     General appearance: alert, cooperative and appears stated age Head: normocephalic, without obvious abnormality, atraumatic Neck: no adenopathy, supple, symmetrical, trachea midline and thyroid normal to inspection and palpation Lungs: clear to auscultation bilaterally Breasts: normal appearance, no masses or tenderness, No nipple retraction or dimpling, No nipple discharge or bleeding, No axillary adenopathy Heart: regular rate and rhythm Abdomen: soft, non-tender; no masses, no organomegaly Extremities: extremities normal, atraumatic, no cyanosis or edema Skin: skin color, texture, turgor normal. No rashes or lesions Lymph nodes: cervical, supraclavicular, and axillary nodes normal. Neurologic: grossly normal  Pelvic: External genitalia:  no lesions              No abnormal inguinal nodes palpated.              Urethra:  normal appearing urethra with no masses, tenderness or lesions              Bartholins and Skenes: normal                 Vagina: normal appearing vagina with normal color and discharge, no lesions.  Second degree rectocele.               Cervix: absent.               Pap taken: No. Bimanual Exam:  Uterus:  absent              Adnexa: no mass,  fullness, tenderness              Rectal exam: Yes.  .  Confirms.              Anus:  normal sphincter tone, no lesions  Chaperone was present for exam.  Assessment:   Well woman visit with normal exam. Status post TVHfor abnormal uterine bleeding, endometriosis, and fibroids.  Status post laparoscopic BSO, LOA.   Benign serous cystadenofibroma.  Remote history of cryotherapy. Rectocele.   Very mild mixed incontinence. History of atypical breast ductal hyperplasia. Status post  partial left mastectomy.  Family history of rectal cancer. mother.  FH DVT/PE. Mother with DVT and hx malignancy. Brother with CAD at a young age.  Obesity.  Prediabetes per patient.  On Metformin.  Sleep apnea.   Plan: Mammogram screening discussed. Self breast awareness reviewed. Pap and HR HPV as above. Guidelines for Calcium, Vitamin D, regular exercise program including cardiovascular and weight bearing exercise. We discussed rectocele - risk factors and tx with physical therapy, pessary, and surgical correction.  ACOG HOs on prolapse and surgery for prolapse.  Risk of bleeding, infection, damage to surrounding organs, dyspareunia, pneumonia, reaction to anesthesia, DVT, PE, death, dyspareunia, recurrence of prolapase reviewed.  She would like to pursue surgery.  She will need medical clearance.  Follow up annually and prn.   An additional 20 minutes spent discussing rectocele as noted above.

## 2019-08-09 NOTE — Patient Instructions (Signed)

## 2019-08-12 ENCOUNTER — Ambulatory Visit: Payer: BC Managed Care – PPO | Admitting: Obstetrics and Gynecology

## 2019-08-24 ENCOUNTER — Telehealth: Payer: Self-pay | Admitting: Obstetrics and Gynecology

## 2019-08-24 NOTE — Telephone Encounter (Signed)
Spoke with patient regarding surgery benefits. Patient acknowledges understanding of information presented. Patient is aware that benefits presented are for professional benefits only. Patient is aware that once surgery is scheduled, the hospital will call with separate benefits. Patient is aware of surgery cancellation policy. ° °Patient is ready to proceed with scheduling. °

## 2019-08-24 NOTE — Telephone Encounter (Signed)
Spoke with patient. Patient would like to proceed with surgery on 09/27/2019. Surgery scheduled for 09/27/2019 0730 at [facility]. Pre op scheduled for 08/30/2019 at 11:30 am. COVID test scheduled for 09/23/2019 at 10:05 am. 1 week post op scheduled for 10/04/2019. 4 week post op scheduled for 10/25/2019 at 1:30 pm. Patient is agreeable to all dates and times. Surgery instructions reviewed.  Patient is asking if she needs to stop her Voltaren 24 hours before surgery?  Also okay for patient to have 1 week and 6 week post op or does this need to be adjusted?

## 2019-08-24 NOTE — Telephone Encounter (Signed)
Have patient stop Voltaren 3 days prior to surgery.

## 2019-08-25 NOTE — Telephone Encounter (Signed)
Okay for patient to be scheduled for 1 week and 6 week post op or does this need to be adjusted?

## 2019-08-25 NOTE — Telephone Encounter (Addendum)
Spoke with patient. Advised patient will need to stop Voltaren 3 days prior to surgery. Patient verbalizes understanding. 4 week post op moved to 6 week post op on 11/08/2019 at 3 pm with Dr.Silva. Patient is agreeable to date and time. Surgery instruction sheet mailed to patient's verified home address on file.  Patient was seen with PCP 08/04/2019 and states she was cleared for surgery. Call to [place] office to have recent OV note faxed and advised will need note stating she is clear for surgery.  Routing to provider and will close encounter.

## 2019-08-25 NOTE — Progress Notes (Signed)
GYNECOLOGY  VISIT   HPI: 64 y.o.   Married  Caucasian  female   G2P2002 with Patient's last menstrual period was 05/26/1993 (within years).   here for surgical consult.  She desires rectocele repair.  Difficulty completing bowel movements.  No fecal incontinence.   Has her second Covid vaccine 09/05/19.   She was cleared for surgery on 08/04/19 by her PCP.  She uses CPAP regularly.  A1C was about 6.1 at her preop op clearance appointment with her PCP.   GYNECOLOGIC HISTORY: Patient's last menstrual period was 05/26/1993 (within years). Contraception:  Hyst Menopausal hormone therapy: none Last mammogram: 07/04/19 - BI-RADS1, cat A density -  [facility]  Last pap smear: 2009 Neg        OB History    Gravida  2   Para  2   Term  2   Preterm      AB      Living  2     SAB      TAB      Ectopic      Multiple      Live Births                 Patient Active Problem List   Diagnosis Date Noted  . Incarcerated incisional hernia s/p lap repair w mesh 10/02/2017 10/02/2017  . Right ovarian cyst 09/12/2017  . Heart murmur 01/01/2016  . Obesity (BMI 30-39.9) 06/27/2014  . OSA (obstructive sleep apnea) 03/24/2014  . Hyperlipidemia 01/20/2014  . DOE (dyspnea on exertion) 01/20/2014  . Chest pain 01/20/2014  . Acute medial meniscal tear 05/25/2013  . 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 Chronic back pain   . Arthritis, degenerative 09/26/2011  . Encounter for long-term (current) use of other medications 09/26/2011  . Rhinitis 07/05/2011  . HYPERLIPIDEMIA 12/21/2006  . Benign essential HTN 12/21/2006  . DIVERTICULOSIS, COLON 12/21/2006    Past Medical History:  Diagnosis Date  . Acute medial meniscal tear 05/25/2013  . Allergy   . Anxiety    regarding  upcoming breast surgery  . Arthritis, degenerative 09/26/2011  . Atypical ductal hyperplasia of left breast   . 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 Breast, lumpectomy, Left - ATYPICAL DUCTAL HYPERPLASIA, SEE COMMENT. - SURGICAL MARGINS, NEGATIVE FOR ATYPIA OR MALIGNANCY. - PREVIOUS BIOPSY SITE IDENTIFIED. - ONE LYMPH NODE, NEGATIVE FOR TUMOR (0/1), 2. Breast, excision, Left medial margin - BENIGN BREAST TISSUE, SEE COMMENT. - NEGATIVE FOR ATYPIA OR MALIGNANCY.   Marland Kitchen Chest pain 01/20/2014  . DDD (degenerative disc disease)   . Diabetes mellitus without complication ([place])   . Diverticulosis of colon   . DIVERTICULOSIS, COLON 12/21/2006   Qualifier: Diagnosis of  By: Leanne Chang MD, Bruce    . DOE (dyspnea on exertion) 01/20/2014  . Encounter for long-term (current) use of other medications 09/26/2011  . Essential hypertension 12/21/2006     Pt states she has a white coat syndrome and her BP is OK at home.     . Family history of adverse reaction to anesthesia    Anesthesia said they woke her mother up too fast and developed A-Fib .  Marland Kitchen GERD (gastroesophageal reflux disease)   . Gout   . Hearing loss   . Heart murmur 01/01/2016  . Hyperlipidemia   . HYPERLIPIDEMIA 12/21/2006   Qualifier: Diagnosis of  By: Leanne Chang MD, Bruce    . Hypertension   . IBS (irritable bowel  syndrome)   . Incarcerated incisional hernia s/p lap repair w mesh 10/02/2017 10/02/2017  . Mild sleep apnea   . Obesity (BMI 30-39.9) 06/27/2014  . OSA (obstructive sleep apnea) 03/24/2014   Mild OSA with AHI 12.4/hr now on CPAP at 10cm H2O.  Persistent oxygen desats <88% for 11 minutes on CPAP and now on nighttime O2 at 2L via CPAP   . OSA on CPAP 06/27/2014  . PONV (postoperative nausea and vomiting)    Position carefully due to [facility] back surgeries  . Right ovarian cyst 09/12/2017  . Sleep apnea    On CPAP  . Wears glasses     Past Surgical History:  Procedure Laterality Date  . ABDOMINAL HYSTERECTOMY  1994   [facility] for DUB--ovaries retained    . ANTERIOR LUMBAR FUSION    . ANTERIOR LUMBAR FUSION  2018   [facility] Dr Jinny Sanders.  2 level ALIF at L4-5 and L5-S1  . BACK SURGERY   2011   lumb cyst  . BACK SURGERY  04/18/2016   Anterior and posterior spinal fusion  . BREAST SURGERY    . CERVICAL LAMINECTOMY    . COLOSTOMY  09/09/2012  . CYST REMOVAL TRUNK     LOWER BACK  . DILATION AND CURETTAGE OF UTERUS    . INSERTION OF MESH N/A 10/02/2017   Procedure: INSERTION OF MESH;  Surgeon: Michael Boston, MD;  Location: [place];  Service: General;  Laterality: N/A;  . JOINT REPLACEMENT     partial right knee  . KNEE ARTHROSCOPY    . KNEE ARTHROSCOPY Right 05/25/2013   Procedure: RIGHT ARTHROSCOPY KNEE, PARTIAL MEDIAL MENISCECTOMY, CHONDROPLASTY, PARTIAL LATERAL MENISCECTOMY;  Surgeon: Gearlean Alf, MD;  Location: [facility];  Service: Orthopedics;  Laterality: Right;  . LAPAROSCOPIC SALPINGO OOPHERECTOMY Bilateral 10/02/2017   Procedure: LAPAROSCOPIC SALPINGO OOPHORECTOMY, CYSTOSCOPY;  Surgeon: Nunzio Cobbs, MD;  [facility];  Service: Gynecology;  Laterality: Bilateral;  . PARTIAL MASTECTOMY WITH NEEDLE LOCALIZATION Left 06/03/2013   Procedure: PARTIAL MASTECTOMY WITH NEEDLE LOCALIZATION;  Surgeon: [name], MD;  Location: [place];  Service: General;  Laterality: Left;  . POSTERIOR LUMBAR FUSION    . SINUS IRRIGATION    . TONSILLECTOMY    . TRIGGER FINGER RELEASE    . VENTRAL HERNIA REPAIR N/A 10/02/2017   Procedure: LAPAROSCOPIC VENTRAL WALL ASSISTED  HERNIA REPAIR WITH MESH;  Surgeon: Michael Boston, MD;  Location: [place];  Service: General;  Laterality: N/A;  . WISDOM TOOTH EXTRACTION      Current Outpatient Medications  Medication Sig Dispense Refill  . acetaminophen (PAIN RELIEVER EXTRA STRENGTH) 500 MG tablet Take 500 mg by mouth every 6 (six) hours as needed for moderate pain or headache.     . allopurinol (ZYLOPRIM) 100 MG tablet Take 2 tablets by mouth daily.    . betamethasone dipropionate 0.05 % cream APPLY TO AFFECTED AREA(S)OON SKIN TWICE DAILY AS NEEDED.     . calcium-vitamin D (OSCAL WITH D) 500-200 MG-UNIT per tablet Take 1 tablet by mouth daily with breakfast.     . diclofenac (VOLTAREN) 75 MG EC tablet Take 75 mg by mouth every evening.     . fexofenadine (ALLEGRA) 180 MG tablet Take 180 mg by mouth every evening.     . fish oil-omega-3 fatty acids 1000 MG capsule Take 1 g by mouth 2 (two) times daily.     . fluticasone (FLONASE) 50 MCG/ACT nasal spray  as needed.    . gabapentin (NEURONTIN) 300 MG capsule Take 300 mg by mouth as needed.    . hydrochlorothiazide (HYDRODIURIL) 25 MG tablet Take 25 mg by mouth daily    . losartan (COZAAR) 50 MG tablet Take 25 mg by mouth daily.    . meclizine (ANTIVERT) 25 MG tablet Take 25 mg by mouth 3 (three) times daily as needed for dizziness.     . metFORMIN (GLUCOPHAGE) 500 MG tablet Take 500 mg by mouth every evening.     . methylcellulose (CITRUCEL) oral powder Take 1 packet by mouth as needed.     . mometasone (NASONEX) 50 MCG/ACT nasal spray Place 2 sprays into the nose at bedtime.     . nebivolol (BYSTOLIC) 10 MG tablet Take 10 mg by mouth every morning.    Marland Kitchen omeprazole (PRILOSEC) 20 MG capsule Take 20 mg by mouth daily as needed (for acid reflux).     . rosuvastatin (CRESTOR) 10 MG tablet Take 1 tablet by mouth 3 times a week. 15 tablet 9   No current facility-administered medications for this visit.     ALLERGIES: Demerol [meperidine], Penicillins, Sulfa antibiotics, Sulfasalazine, Triamterene, Amlodipine besylate, Arimidex [anastrozole], Lipitor [atorvastatin], Lisinopril, Propoxyphene, Simvastatin, and Topiramate  Family History  Problem Relation Age of Onset  . Heart disease Mother   . Deep vein thrombosis Mother   . Colon cancer Mother   . Rectal cancer Mother 40  . Lung disease Mother   . Colon polyps Mother   . Heart disease Father   . COPD Father   . Rheum arthritis Father   . Osteoarthritis Father   . Colon polyps Father   . Heart disease Brother   . Pulmonary embolism Brother    . Rheum arthritis Daughter   . Esophageal cancer Neg Hx   . Stomach cancer Neg Hx     Social History   Socioeconomic History  . Marital status: Married    Spouse name: Not on file  . Number of children: Not on file  . Years of education: Not on file  . Highest education level: Not on file  Occupational History  . Not on file  Tobacco Use  . Smoking status: Never Smoker  . Smokeless tobacco: Never Used  Substance and Sexual Activity  . Alcohol use: No  . Drug use: No  . Sexual activity: Yes    Partners: Male    Comment: TVH  Other Topics Concern  . Not on file  Social History Narrative  . Not on file   Social Determinants of Health   Financial Resource Strain:   . Difficulty of Paying Living Expenses:   Food Insecurity:   . Worried About Charity fundraiser in the Last Year:   . Arboriculturist in the Last Year:   Transportation Needs:   . Film/video editor (Medical):   Marland Kitchen Lack of Transportation (Non-Medical):   Physical Activity:   . Days of Exercise per Week:   . Minutes of Exercise per Session:   Stress:   . Feeling of Stress :   Social Connections:   . Frequency of Communication with Friends and Family:   . Frequency of Social Gatherings with Friends and Family:   . Attends Religious Services:   . Active Member of Clubs or Organizations:   . Attends Archivist Meetings:   Marland Kitchen Marital Status:   Intimate Partner Violence:   . Fear of Current or Ex-Partner:   .  Emotionally Abused:   Marland Kitchen Physically Abused:   . Sexually Abused:     Review of Systems  Constitutional: Negative.   HENT: Negative.   Eyes: Negative.   Respiratory: Negative.   Cardiovascular: Negative.   Gastrointestinal: Negative.   Endocrine: Negative.   Genitourinary: Negative.   Musculoskeletal: Negative.   Skin: Negative.   Allergic/Immunologic: Negative.   Neurological: Negative.   Hematological: Negative.   Psychiatric/Behavioral: Negative.     PHYSICAL  EXAMINATION:    BP 132/78 (BP Location: Right Arm, Patient Position: Sitting, Cuff Size: Large)   Pulse 64   Temp (!) 97.5 F (36.4 C) (Temporal)   Resp 10   Ht 5' 4.5" (1.638 m)   Wt 233 lb (105.7 kg)   LMP 05/26/1993 (Within Years)   BMI 39.38 kg/m     General appearance: alert, cooperative and appears stated age Head: Normocephalic, without obvious abnormality, atraumatic Neck: no adenopathy, supple, symmetrical, trachea midline and thyroid normal to inspection and palpation Lungs: clear to auscultation bilaterally Breasts: normal appearance, no masses or tenderness, No nipple retraction or dimpling, No nipple discharge or bleeding, No axillary or supraclavicular adenopathy Heart: regular rate and rhythm Abdomen: vertical midline incision. Abdomen is soft, non-tender, no masses,  no organomegaly Extremities: extremities normal, atraumatic, no cyanosis or edema Skin: Skin color, texture, turgor normal. No rashes or lesions Lymph nodes: Cervical, supraclavicular, and axillary nodes normal. No abnormal inguinal nodes palpated Neurologic: Grossly normal  Pelvic: External genitalia:  no lesions              Urethra:  normal appearing urethra with no masses, tenderness or lesions              Bartholins and Skenes: normal                 Vagina: normal appearing vagina with normal color and discharge, no lesions. Second degree rectocele.               Cervix: absent                Bimanual Exam:  Uterus: absent              Adnexa: no mass, fullness, tenderness              Rectal exam: Yes.  .  Confirms.              Anus:  normal sphincter tone, no lesions  Chaperone was present for exam.  ASSESSMENT  Rectocele.  Takes Voltaren.  She denies history of colostomy.  PLAN  We discussed rectocele repair.  Risks, benefits, and alternatives have been reviewed. Patient wishes to proceed.  She will discontinue Voltaren 3 days prior to surgery.  An After Visit Summary was  printed and given to the patient.

## 2019-08-25 NOTE — Telephone Encounter (Signed)
1 and 6 week post op appointments are good.  I can add any additional post op appointment later if needed.

## 2019-08-30 ENCOUNTER — Ambulatory Visit (INDEPENDENT_AMBULATORY_CARE_PROVIDER_SITE_OTHER): Payer: BC Managed Care – PPO | Admitting: Obstetrics and Gynecology

## 2019-08-30 ENCOUNTER — Other Ambulatory Visit: Payer: Self-pay

## 2019-08-30 ENCOUNTER — Encounter: Payer: Self-pay | Admitting: Obstetrics and Gynecology

## 2019-08-30 VITALS — BP 132/78 | HR 64 | Temp 97.5°F | Resp 10 | Ht 64.5 in | Wt 233.0 lb

## 2019-08-30 DIAGNOSIS — N816 Rectocele: Secondary | ICD-10-CM

## 2019-09-08 ENCOUNTER — Telehealth: Payer: Self-pay | Admitting: Obstetrics and Gynecology

## 2019-09-08 NOTE — Telephone Encounter (Signed)
Preop clearance note received from patient's PCP.   Copy of her latest hemoglobin A1C not included.   Please get a cop of this lab for my review.   Thank you.

## 2019-09-12 NOTE — Telephone Encounter (Signed)
Spoke with Dr.Hasanaj's office. Labs to be faxed to our office for Dr.Silva's review.

## 2019-09-16 NOTE — Telephone Encounter (Signed)
Spoke with patient. Advised PAT nurse will call 1 week before surgery. Should hear from them by 09/20/2019. Advised if she does not received a call to contact our office.   Call again to [place] office for lab work results. These are to be faxed to our office again at this time.

## 2019-09-16 NOTE — Telephone Encounter (Signed)
Routing to Kaitlyn Sprague, RN °

## 2019-09-16 NOTE — Telephone Encounter (Signed)
Patient is concerned that the hospital has not called  for her pre op and her surgery is 09/27/19. She asked is this normal?

## 2019-09-20 ENCOUNTER — Encounter (HOSPITAL_BASED_OUTPATIENT_CLINIC_OR_DEPARTMENT_OTHER): Payer: Self-pay | Admitting: Obstetrics and Gynecology

## 2019-09-20 ENCOUNTER — Other Ambulatory Visit: Payer: Self-pay

## 2019-09-20 NOTE — Progress Notes (Addendum)
Spoke w/ via phone for pre-op interview--- Ann Fernandez needs dos---- [place]              Lab results------ EKG in [place] 01/20/2019 COVID test ------09/23/2019 @ 63 Arrive at -------0530 NPO after ------MN Medications to take morning of surgery ----- Bystolic, Zyloprim and Prilosec Diabetic medication ----- Verbalized understanding to not take any DM medications AM of surgery. Patient Special Instructions ----- Patient to bring CPAP. Also has bilateral hearing aids. Pre-Op special Istructions ----- Patient verbalized understanding of instructions that were given at this phone interview. Patient denies shortness of breath, chest pain, fever, cough a this phone interview.

## 2019-09-21 NOTE — Telephone Encounter (Signed)
Dr.Silva, have you received results from PCP?

## 2019-09-22 NOTE — Telephone Encounter (Signed)
Lab results reviewed and sent in to scan center for scanning in to [place].

## 2019-09-23 ENCOUNTER — Other Ambulatory Visit (HOSPITAL_COMMUNITY)
Admission: RE | Admit: 2019-09-23 | Discharge: 2019-09-23 | Disposition: A | Discharge status: Home / Self Care | Payer: BC Managed Care – PPO | Source: Ambulatory Visit | Attending: Obstetrics and Gynecology | Admitting: Obstetrics and Gynecology

## 2019-09-23 DIAGNOSIS — Z20822 Contact with and (suspected) exposure to covid-19: Secondary | ICD-10-CM | POA: Insufficient documentation

## 2019-09-23 DIAGNOSIS — Z01812 Encounter for preprocedural laboratory examination: Secondary | ICD-10-CM | POA: Diagnosis not present

## 2019-09-23 LAB — SARS CORONAVIRUS 2 (TAT 6-24 HRS): SARS Coronavirus 2: NEGATIVE

## 2019-09-26 NOTE — Anesthesia Preprocedure Evaluation (Addendum)
Anesthesia Evaluation  Patient identified by MRN, date of birth, ID band Patient awake    Reviewed: Allergy & Precautions, NPO status , Patient's Chart, lab work & pertinent test results  History of Anesthesia Complications (+) PONV and history of anesthetic complications  Airway Mallampati: I  TM Distance: >3 FB Neck ROM: Full    Dental no notable dental hx. (+) Teeth Intact, Dental Advisory Given   Pulmonary neg pulmonary ROS, sleep apnea and Continuous Positive Airway Pressure Ventilation ,    Pulmonary exam normal breath sounds clear to auscultation       Cardiovascular hypertension, Pt. on medications and Pt. on home beta blockers negative cardio ROS Normal cardiovascular exam Rhythm:Regular Rate:Normal     Neuro/Psych Anxiety negative neurological ROS  negative psych ROS   GI/Hepatic negative GI ROS, Neg liver ROS, GERD  Medicated and Controlled,  Endo/Other  negative endocrine ROSdiabetes, Type 2, Oral Hypoglycemic Agents  Renal/GU negative Renal ROS  negative genitourinary   Musculoskeletal negative musculoskeletal ROS (+) Arthritis , Osteoarthritis,    Abdominal (+) + obese,   Peds negative pediatric ROS (+)  Hematology negative hematology ROS (+)   Anesthesia Other Findings   Reproductive/Obstetrics negative OB ROS                            Anesthesia Physical  Anesthesia Plan  ASA: III  Anesthesia Plan: General   Post-op Pain Management:    Induction: Intravenous  PONV Risk Score and Plan: 4 or greater and Ondansetron, Dexamethasone, Midazolam and Scopolamine patch - Pre-op  Airway Management Planned: Oral ETT and LMA  Additional Equipment:   Intra-op Plan:   Post-operative Plan: Extubation in OR  Informed Consent: I have reviewed the patients History and Physical, chart, labs and discussed the procedure including the risks, benefits and alternatives for the  proposed anesthesia with the patient or authorized representative who has indicated his/her understanding and acceptance.     Dental advisory given  Plan Discussed with: CRNA, Surgeon and Anesthesiologist  Anesthesia Plan Comments:         Anesthesia Quick Evaluation

## 2019-09-27 ENCOUNTER — Ambulatory Visit (HOSPITAL_BASED_OUTPATIENT_CLINIC_OR_DEPARTMENT_OTHER)
Admission: RE | Admit: 2019-09-27 | Discharge: 2019-09-27 | Disposition: A | Discharge status: Home / Self Care | Payer: BC Managed Care – PPO | Attending: Obstetrics and Gynecology | Admitting: Obstetrics and Gynecology

## 2019-09-27 ENCOUNTER — Ambulatory Visit (HOSPITAL_BASED_OUTPATIENT_CLINIC_OR_DEPARTMENT_OTHER): Payer: BC Managed Care – PPO | Admitting: Anesthesiology

## 2019-09-27 ENCOUNTER — Encounter (HOSPITAL_BASED_OUTPATIENT_CLINIC_OR_DEPARTMENT_OTHER): Payer: Self-pay | Admitting: Obstetrics and Gynecology

## 2019-09-27 ENCOUNTER — Other Ambulatory Visit: Payer: Self-pay

## 2019-09-27 ENCOUNTER — Encounter (HOSPITAL_BASED_OUTPATIENT_CLINIC_OR_DEPARTMENT_OTHER)
Admission: RE | Disposition: A | Discharge status: Home / Self Care | Payer: Self-pay | Source: Home / Self Care | Attending: Obstetrics and Gynecology

## 2019-09-27 DIAGNOSIS — E669 Obesity, unspecified: Secondary | ICD-10-CM | POA: Diagnosis not present

## 2019-09-27 DIAGNOSIS — G4733 Obstructive sleep apnea (adult) (pediatric): Secondary | ICD-10-CM | POA: Insufficient documentation

## 2019-09-27 DIAGNOSIS — Z7984 Long term (current) use of oral hypoglycemic drugs: Secondary | ICD-10-CM | POA: Insufficient documentation

## 2019-09-27 DIAGNOSIS — E785 Hyperlipidemia, unspecified: Secondary | ICD-10-CM | POA: Insufficient documentation

## 2019-09-27 DIAGNOSIS — N815 Vaginal enterocele: Secondary | ICD-10-CM | POA: Diagnosis not present

## 2019-09-27 DIAGNOSIS — Z79899 Other long term (current) drug therapy: Secondary | ICD-10-CM | POA: Insufficient documentation

## 2019-09-27 DIAGNOSIS — E119 Type 2 diabetes mellitus without complications: Secondary | ICD-10-CM | POA: Diagnosis not present

## 2019-09-27 DIAGNOSIS — N816 Rectocele: Secondary | ICD-10-CM | POA: Insufficient documentation

## 2019-09-27 DIAGNOSIS — I10 Essential (primary) hypertension: Secondary | ICD-10-CM | POA: Insufficient documentation

## 2019-09-27 DIAGNOSIS — Z6838 Body mass index (BMI) 38.0-38.9, adult: Secondary | ICD-10-CM | POA: Diagnosis not present

## 2019-09-27 HISTORY — PX: RECTOCELE REPAIR: SHX761

## 2019-09-27 LAB — CBC
HCT: 39.9 % (ref 36.0–46.0)
Hemoglobin: 13.6 g/dL (ref 12.0–15.0)
MCH: 31.3 pg (ref 26.0–34.0)
MCHC: 34.1 g/dL (ref 30.0–36.0)
MCV: 91.7 fL (ref 80.0–100.0)
Platelets: 156 10*3/uL (ref 150–400)
RBC: 4.35 MIL/uL (ref 3.87–5.11)
RDW: 13.2 % (ref 11.5–15.5)
WBC: 6.6 10*3/uL (ref 4.0–10.5)
nRBC: 0 % (ref 0.0–0.2)

## 2019-09-27 LAB — BASIC METABOLIC PANEL
Anion gap: 11 (ref 5–15)
BUN: 14 mg/dL (ref 8–23)
CO2: 24 mmol/L (ref 22–32)
Calcium: 9.5 mg/dL (ref 8.9–10.3)
Chloride: 105 mmol/L (ref 98–111)
Creatinine, Ser: 0.69 mg/dL (ref 0.44–1.00)
GFR calc Af Amer: >60 mL/min (ref >60)
GFR calc non Af Amer: >60 mL/min (ref >60)
Glucose, Bld: 109 mg/dL — ABNORMAL HIGH (ref 70–99)
Potassium: 3.6 mmol/L (ref 3.5–5.1)
Sodium: 140 mmol/L (ref 135–145)

## 2019-09-27 LAB — GLUCOSE, CAPILLARY: Glucose-Capillary: 119 mg/dL — ABNORMAL HIGH (ref 70–99)

## 2019-09-27 SURGERY — COLPORRHAPHY, POSTERIOR, FOR RECTOCELE REPAIR
Anesthesia: General | Site: Vagina

## 2019-09-27 MED ORDER — METRONIDAZOLE IN NACL 5-0.79 MG/ML-% IV SOLN
INTRAVENOUS | Status: AC
  Filled 2019-09-27: qty 100

## 2019-09-27 MED ORDER — SUGAMMADEX SODIUM 500 MG/5ML IV SOLN
INTRAVENOUS | Status: DC | PRN
  Administered 2019-09-27: 350 mg via INTRAVENOUS

## 2019-09-27 MED ORDER — LIDOCAINE 2% (20 MG/ML) 5 ML SYRINGE
INTRAMUSCULAR | Status: DC | PRN
  Administered 2019-09-27: 60 mg via INTRAVENOUS

## 2019-09-27 MED ORDER — SCOPOLAMINE 1 MG/3DAYS TD PT72
1.0000 | MEDICATED_PATCH | TRANSDERMAL | Status: DC
  Administered 2019-09-27: 1.5 mg via TRANSDERMAL

## 2019-09-27 MED ORDER — ACETAMINOPHEN 160 MG/5ML PO SOLN: 325.0000 mg | ORAL | Status: DC | PRN

## 2019-09-27 MED ORDER — LIDOCAINE-EPINEPHRINE 1 %-1:100000 IJ SOLN
INTRAMUSCULAR | Status: DC | PRN
  Administered 2019-09-27: 10 mL

## 2019-09-27 MED ORDER — OXYCODONE HCL 5 MG/5ML PO SOLN: 5.0000 mg | Freq: Once | ORAL | Status: DC | PRN

## 2019-09-27 MED ORDER — OXYCODONE HCL 5 MG PO TABS: 5.0000 mg | ORAL_TABLET | Freq: Once | ORAL | Status: DC | PRN

## 2019-09-27 MED ORDER — MEPERIDINE HCL 25 MG/ML IJ SOLN: 6.2500 mg | INTRAMUSCULAR | Status: DC | PRN

## 2019-09-27 MED ORDER — FENTANYL CITRATE (PF) 100 MCG/2ML IJ SOLN: 25.0000 ug | INTRAMUSCULAR | Status: DC | PRN

## 2019-09-27 MED ORDER — PROPOFOL 10 MG/ML IV BOLUS
INTRAVENOUS | Status: AC
  Filled 2019-09-27: qty 20

## 2019-09-27 MED ORDER — FENTANYL CITRATE (PF) 100 MCG/2ML IJ SOLN
INTRAMUSCULAR | Status: AC
  Filled 2019-09-27: qty 2

## 2019-09-27 MED ORDER — LACTATED RINGERS IV SOLN: INTRAVENOUS | Status: DC

## 2019-09-27 MED ORDER — MIDAZOLAM HCL 2 MG/2ML IJ SOLN
INTRAMUSCULAR | Status: AC
  Filled 2019-09-27: qty 2

## 2019-09-27 MED ORDER — PROPOFOL 500 MG/50ML IV EMUL
INTRAVENOUS | Status: DC | PRN
  Administered 2019-09-27: 150 mg via INTRAVENOUS
  Administered 2019-09-27: 30 mg via INTRAVENOUS

## 2019-09-27 MED ORDER — ACETAMINOPHEN 500 MG PO TABS
1000.0000 mg | ORAL_TABLET | Freq: Once | ORAL | Status: AC
  Administered 2019-09-27: 1000 mg via ORAL

## 2019-09-27 MED ORDER — SUGAMMADEX SODIUM 500 MG/5ML IV SOLN
INTRAVENOUS | Status: AC
  Filled 2019-09-27: qty 5

## 2019-09-27 MED ORDER — CELECOXIB 200 MG PO CAPS
ORAL_CAPSULE | ORAL | Status: AC
  Filled 2019-09-27: qty 1

## 2019-09-27 MED ORDER — DEXAMETHASONE SODIUM PHOSPHATE 10 MG/ML IJ SOLN
INTRAMUSCULAR | Status: AC
  Filled 2019-09-27: qty 1

## 2019-09-27 MED ORDER — LIDOCAINE 2% (20 MG/ML) 5 ML SYRINGE
INTRAMUSCULAR | Status: AC
  Filled 2019-09-27: qty 5

## 2019-09-27 MED ORDER — SCOPOLAMINE 1 MG/3DAYS TD PT72
MEDICATED_PATCH | TRANSDERMAL | Status: AC
  Filled 2019-09-27: qty 1

## 2019-09-27 MED ORDER — ACETAMINOPHEN 500 MG PO TABS
ORAL_TABLET | ORAL | Status: AC
  Filled 2019-09-27: qty 2

## 2019-09-27 MED ORDER — FENTANYL CITRATE (PF) 100 MCG/2ML IJ SOLN
INTRAMUSCULAR | Status: DC | PRN
  Administered 2019-09-27: 100 ug via INTRAVENOUS

## 2019-09-27 MED ORDER — ACETAMINOPHEN 325 MG PO TABS: 325.0000 mg | ORAL_TABLET | ORAL | Status: DC | PRN

## 2019-09-27 MED ORDER — ONDANSETRON HCL 4 MG/2ML IJ SOLN
INTRAMUSCULAR | Status: AC
  Filled 2019-09-27: qty 2

## 2019-09-27 MED ORDER — HYDROCODONE-ACETAMINOPHEN 5-325 MG PO TABS: 1.0000 | ORAL_TABLET | ORAL | 0 refills | Status: DC | PRN

## 2019-09-27 MED ORDER — MIDAZOLAM HCL 2 MG/2ML IJ SOLN
INTRAMUSCULAR | Status: DC | PRN
  Administered 2019-09-27: 2 mg via INTRAVENOUS

## 2019-09-27 MED ORDER — CELECOXIB 200 MG PO CAPS
200.0000 mg | ORAL_CAPSULE | Freq: Once | ORAL | Status: AC
  Administered 2019-09-27: 200 mg via ORAL

## 2019-09-27 MED ORDER — CIPROFLOXACIN IN D5W 400 MG/200ML IV SOLN
INTRAVENOUS | Status: AC
  Filled 2019-09-27: qty 200

## 2019-09-27 MED ORDER — ONDANSETRON HCL 4 MG/2ML IJ SOLN
4.0000 mg | Freq: Once | INTRAMUSCULAR | Status: AC | PRN
  Administered 2019-09-27: 4 mg via INTRAVENOUS

## 2019-09-27 MED ORDER — ROCURONIUM BROMIDE 10 MG/ML (PF) SYRINGE
PREFILLED_SYRINGE | INTRAVENOUS | Status: DC | PRN
Start: 2019-09-27 — End: 2019-09-27
  Administered 2019-09-27: 10 mg via INTRAVENOUS
  Administered 2019-09-27: 50 mg via INTRAVENOUS
  Administered 2019-09-27: 10 mg via INTRAVENOUS

## 2019-09-27 MED ORDER — EPHEDRINE SULFATE-NACL 50-0.9 MG/10ML-% IV SOSY
PREFILLED_SYRINGE | INTRAVENOUS | Status: DC | PRN
  Administered 2019-09-27: 10 mg via INTRAVENOUS

## 2019-09-27 MED ORDER — ESTRADIOL 0.1 MG/GM VA CREA
TOPICAL_CREAM | VAGINAL | Status: AC
  Filled 2019-09-27: qty 42.5

## 2019-09-27 MED ORDER — DEXAMETHASONE SODIUM PHOSPHATE 10 MG/ML IJ SOLN
INTRAMUSCULAR | Status: DC | PRN
  Administered 2019-09-27: 10 mg via INTRAVENOUS

## 2019-09-27 MED ORDER — EPHEDRINE 5 MG/ML INJ
INTRAVENOUS | Status: AC
  Filled 2019-09-27: qty 10

## 2019-09-27 MED ORDER — LIDOCAINE-EPINEPHRINE 1 %-1:100000 IJ SOLN
INTRAMUSCULAR | Status: AC
  Filled 2019-09-27: qty 1

## 2019-09-27 MED ORDER — CIPROFLOXACIN IN D5W 400 MG/200ML IV SOLN
400.0000 mg | INTRAVENOUS | Status: AC
  Administered 2019-09-27: 400 mg via INTRAVENOUS

## 2019-09-27 MED ORDER — METRONIDAZOLE IN NACL 5-0.79 MG/ML-% IV SOLN
500.0000 mg | INTRAVENOUS | Status: AC
  Administered 2019-09-27: 500 mg via INTRAVENOUS

## 2019-09-27 SURGICAL SUPPLY — 23 items
CANISTER SUCT 3000ML PPV (MISCELLANEOUS) ×3 IMPLANT
COVER WAND RF STERILE (DRAPES) IMPLANT
DECANTER SPIKE VIAL GLASS SM (MISCELLANEOUS) ×3 IMPLANT
GAUZE PACKING 2X5 YD STRL (GAUZE/BANDAGES/DRESSINGS) IMPLANT
GLOVE BIO SURGEON STRL SZ 6.5 (GLOVE) ×6 IMPLANT
GLOVE BIOGEL PI IND STRL 6.5 (GLOVE) ×2 IMPLANT
GLOVE BIOGEL PI IND STRL 7.0 (GLOVE) ×6 IMPLANT
GLOVE BIOGEL PI INDICATOR 6.5 (GLOVE) ×1
GLOVE BIOGEL PI INDICATOR 7.0 (GLOVE) ×3
GLOVE SURG SS PI 6.5 STRL IVOR (GLOVE) ×3 IMPLANT
GOWN STRL REUS W/TWL LRG LVL3 (GOWN DISPOSABLE) ×15 IMPLANT
NEEDLE HYPO 22GX1.5 SAFETY (NEEDLE) ×3 IMPLANT
NS IRRIG 1000ML POUR BTL (IV SOLUTION) ×3 IMPLANT
PACK VAGINAL WOMENS (CUSTOM PROCEDURE TRAY) IMPLANT
SET IRRIG Y TYPE TUR BLADDER L (SET/KITS/TRAYS/PACK) ×3 IMPLANT
SUT VIC AB 0 CT1 27 (SUTURE) ×3
SUT VIC AB 0 CT1 27XBRD ANBCTR (SUTURE) ×2 IMPLANT
SUT VIC AB 2-0 CT1 27 (SUTURE) ×3
SUT VIC AB 2-0 CT1 TAPERPNT 27 (SUTURE) ×2 IMPLANT
SUT VIC AB 2-0 SH 27 (SUTURE) ×3
SUT VIC AB 2-0 SH 27XBRD (SUTURE) ×2 IMPLANT
TOWEL OR 17X26 10 PK STRL BLUE (TOWEL DISPOSABLE) ×3 IMPLANT
TRAY FOLEY W/BAG SLVR 14FR (SET/KITS/TRAYS/PACK) ×3 IMPLANT

## 2019-09-27 NOTE — H&P (Signed)
Office Visit  08/30/2019 [place] Silva, Everardo All, MD Obstetrics and Gynecology  Rectocele Dx  Office Visit ; Referred by Neale Burly, MD Reason for Visit  Additional Documentation  Vitals:    BP 132/78 (BP Location: Right Arm, Patient Position: Sitting, Cuff Size: Large)  Pulse 64  Temp 97.5 F (36.4 C)  (Temporal)  Resp 10  Ht 5' 4.5" (1.638 m)  Wt 105.7 kg  LMP 05/26/1993 (Within Years)  BMI 39.38 kg/m  BSA 2.19 m    More Vitals  Flowsheets:    NEWS,  MEWS Score,  Anthropometrics,  Method of Visit    Encounter Info:    Billing Info,  History,  Allergies,  Detailed Report    Orthostatic Vitals Recorded in This Encounter   08/30/2019  1117     Patient Position: Sitting  BP Location: Right Arm  Cuff Size: Large  All Notes   Progress Notes by Nunzio Cobbs, MD at 08/30/2019 11:30 AM Author: Nunzio Cobbs, MD Author Type: Physician Filed: 09/03/2019 10:10 PM  Note Status: Signed Cosign: Cosign Not Required Encounter Date: 08/30/2019  Editor: Nunzio Cobbs, MD (Physician)  Prior Versions: 1. Archie Balboa CMA (Certified Psychologist, sport and exercise) at 08/30/2019 11:23 AM - Sign when Signing Visit   2. Lowella Fairy, CMA (Certified Medical Assistant) at 08/25/2019  9:05 AM - Sign when Signing Visit    GYNECOLOGY  VISIT   HPI: 64 y.o.   Married  Caucasian  female   G2P2002 with Patient's last menstrual period was 05/26/1993 (within years).   here for surgical consult.  She desires rectocele repair.  Difficulty completing bowel movements.  No fecal incontinence.    Has her second Covid vaccine 09/05/19.    She was cleared for surgery on 08/04/19 by her PCP.   She uses CPAP regularly.  A1C was about 6.1 at her preop op clearance appointment with her PCP.    GYNECOLOGIC HISTORY: Patient's last menstrual period was 05/26/1993 (within years). Contraception:  Hyst Menopausal  hormone therapy: none Last mammogram: 07/04/19 - BI-RADS1, cat A density -  [facility]  Last pap smear: 2009 Neg                OB History     Gravida  2   Para  2   Term  2   Preterm      AB      Living  2      SAB      TAB      Ectopic      Multiple      Live Births                        Patient Active Problem List    Diagnosis Date Noted  . Incarcerated incisional hernia s/p lap repair w mesh 10/02/2017 10/02/2017  . Right ovarian cyst 09/12/2017  . Heart murmur 01/01/2016  . Obesity (BMI 30-39.9) 06/27/2014  . OSA (obstructive sleep apnea) 03/24/2014  . Hyperlipidemia 01/20/2014  . DOE (dyspnea on exertion) 01/20/2014  . Chest pain 01/20/2014  . Acute medial meniscal tear 05/25/2013  . 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 Chronic back pain    . Arthritis, degenerative 09/26/2011  . Encounter for long-term (current) use of other medications 09/26/2011  . Rhinitis 07/05/2011  . HYPERLIPIDEMIA 12/21/2006  . Benign essential HTN 12/21/2006  .  DIVERTICULOSIS, COLON 12/21/2006          Past Medical History:  Diagnosis Date  . Acute medial meniscal tear 05/25/2013  . Allergy    . Anxiety      regarding  upcoming breast surgery  . Arthritis, degenerative 09/26/2011  . Atypical ductal hyperplasia of left breast    . 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 Breast, lumpectomy, Left - ATYPICAL DUCTAL HYPERPLASIA, SEE COMMENT. - SURGICAL MARGINS, NEGATIVE FOR ATYPIA OR MALIGNANCY. - PREVIOUS BIOPSY SITE IDENTIFIED. - ONE LYMPH NODE, NEGATIVE FOR TUMOR (0/1), 2. Breast, excision, Left medial margin - BENIGN BREAST TISSUE, SEE COMMENT. - NEGATIVE FOR ATYPIA OR MALIGNANCY.   Marland Kitchen Chest pain 01/20/2014  . DDD (degenerative disc disease)    . Diabetes mellitus without complication ([place])    . Diverticulosis of colon    . DIVERTICULOSIS, COLON 12/21/2006    Qualifier: Diagnosis of  By: Leanne Chang MD, Bruce      . DOE (dyspnea on exertion) 01/20/2014  . Encounter for long-term (current) use of other medications 09/26/2011  . Essential hypertension 12/21/2006      Pt states she has a white coat syndrome and her BP is OK at home.     . Family history of adverse reaction to anesthesia      Anesthesia said they woke her mother up too fast and developed A-Fib .  Marland Kitchen GERD (gastroesophageal reflux disease)    . Gout    . Hearing loss    . Heart murmur 01/01/2016  . Hyperlipidemia    . HYPERLIPIDEMIA 12/21/2006    Qualifier: Diagnosis of  By: Leanne Chang MD, Bruce    . Hypertension    . IBS (irritable bowel syndrome)    . Incarcerated incisional hernia s/p lap repair w mesh 10/02/2017 10/02/2017  . Mild sleep apnea    . Obesity (BMI 30-39.9) 06/27/2014  . OSA (obstructive sleep apnea) 03/24/2014    Mild OSA with AHI 12.4/hr now on CPAP at 10cm H2O.  Persistent oxygen desats <88% for 11 minutes on CPAP and now on nighttime O2 at 2L via CPAP   . OSA on CPAP 06/27/2014  . PONV (postoperative nausea and vomiting)      Position carefully due to Ray-Cage back surgeries  . Right ovarian cyst 09/12/2017  . Sleep apnea      On CPAP  . Wears glasses             Past Surgical History:  Procedure Laterality Date  . ABDOMINAL HYSTERECTOMY   1994    TVH for DUB--ovaries retained    . ANTERIOR LUMBAR FUSION      . ANTERIOR LUMBAR FUSION   2018    [facility] Dr Jinny Sanders.  2 level ALIF at L4-5 and L5-S1  . BACK SURGERY   2011    lumb cyst  . BACK SURGERY   04/18/2016    Anterior and posterior spinal fusion  . BREAST SURGERY      . CERVICAL LAMINECTOMY      . COLOSTOMY   09/09/2012  . CYST REMOVAL TRUNK        LOWER BACK  . DILATION AND CURETTAGE OF UTERUS      . INSERTION OF MESH N/A 10/02/2017    Procedure: INSERTION OF MESH;  Surgeon: Michael Boston, MD;  Location: [place];  Service: General;  Laterality: N/A;  . JOINT REPLACEMENT        partial  right knee  . KNEE ARTHROSCOPY      . KNEE ARTHROSCOPY Right  05/25/2013    Procedure: RIGHT ARTHROSCOPY KNEE, PARTIAL MEDIAL MENISCECTOMY, CHONDROPLASTY, PARTIAL LATERAL MENISCECTOMY;  Surgeon: Gearlean Alf, MD;  Location: [facility];  Service: Orthopedics;  Laterality: Right;  . LAPAROSCOPIC SALPINGO OOPHERECTOMY Bilateral 10/02/2017    Procedure: LAPAROSCOPIC SALPINGO OOPHORECTOMY, CYSTOSCOPY;  Surgeon: Nunzio Cobbs, MD;  Location: [facility];  Service: Gynecology;  Laterality: Bilateral;  . PARTIAL MASTECTOMY WITH NEEDLE LOCALIZATION Left 06/03/2013    Procedure: PARTIAL MASTECTOMY WITH NEEDLE LOCALIZATION;  Surgeon: [name], MD;  Location: [place];  Service: General;  Laterality: Left;  . POSTERIOR LUMBAR FUSION      . SINUS IRRIGATION      . TONSILLECTOMY      . TRIGGER FINGER RELEASE      . VENTRAL HERNIA REPAIR N/A 10/02/2017    Procedure: LAPAROSCOPIC VENTRAL WALL ASSISTED  HERNIA REPAIR WITH MESH;  Surgeon: Michael Boston, MD;  Location: [place];  Service: General;  Laterality: N/A;  . WISDOM TOOTH EXTRACTION                Current Outpatient Medications  Medication Sig Dispense Refill  . acetaminophen (PAIN RELIEVER EXTRA STRENGTH) 500 MG tablet Take 500 mg by mouth every 6 (six) hours as needed for moderate pain or headache.       . allopurinol (ZYLOPRIM) 100 MG tablet Take 2 tablets by mouth daily.      . betamethasone dipropionate 0.05 % cream APPLY TO AFFECTED AREA(S)OON SKIN TWICE DAILY AS NEEDED.      . calcium-vitamin D (OSCAL WITH D) 500-200 MG-UNIT per tablet Take 1 tablet by mouth daily with breakfast.       . diclofenac (VOLTAREN) 75 MG EC tablet Take 75 mg by mouth every evening.       . fexofenadine (ALLEGRA) 180 MG tablet Take 180 mg by mouth every evening.       . fish oil-omega-3 fatty acids 1000 MG capsule Take 1 g by mouth 2 (two) times daily.       . fluticasone (FLONASE) 50 MCG/ACT nasal spray as needed.      . gabapentin (NEURONTIN) 300 MG capsule Take  300 mg by mouth as needed.      . hydrochlorothiazide (HYDRODIURIL) 25 MG tablet Take 25 mg by mouth daily      . losartan (COZAAR) 50 MG tablet Take 25 mg by mouth daily.      . meclizine (ANTIVERT) 25 MG tablet Take 25 mg by mouth 3 (three) times daily as needed for dizziness.       . metFORMIN (GLUCOPHAGE) 500 MG tablet Take 500 mg by mouth every evening.       . methylcellulose (CITRUCEL) oral powder Take 1 packet by mouth as needed.       . mometasone (NASONEX) 50 MCG/ACT nasal spray Place 2 sprays into the nose at bedtime.       . nebivolol (BYSTOLIC) 10 MG tablet Take 10 mg by mouth every morning.      Marland Kitchen omeprazole (PRILOSEC) 20 MG capsule Take 20 mg by mouth daily as needed (for acid reflux).       . rosuvastatin (CRESTOR) 10 MG tablet Take 1 tablet by mouth 3 times a week. 15 tablet 9    No current facility-administered medications for this visit.      ALLERGIES: Demerol [meperidine], Penicillins, Sulfa  antibiotics, Sulfasalazine, Triamterene, Amlodipine besylate, Arimidex [anastrozole], Lipitor [atorvastatin], Lisinopril, Propoxyphene, Simvastatin, and Topiramate        Family History  Problem Relation Age of Onset  . Heart disease Mother    . Deep vein thrombosis Mother    . Colon cancer Mother    . Rectal cancer Mother 105  . Lung disease Mother    . Colon polyps Mother    . Heart disease Father    . COPD Father    . Rheum arthritis Father    . Osteoarthritis Father    . Colon polyps Father    . Heart disease Brother    . Pulmonary embolism Brother    . Rheum arthritis Daughter    . Esophageal cancer Neg Hx    . Stomach cancer Neg Hx        Social History         Socioeconomic History  . Marital status: Married      Spouse name: Not on file  . Number of children: Not on file  . Years of education: Not on file  . Highest education level: Not on file  Occupational History  . Not on file  Tobacco Use  . Smoking status: Never Smoker  . Smokeless tobacco: Never  Used  Substance and Sexual Activity  . Alcohol use: No  . Drug use: No  . Sexual activity: Yes      Partners: Male      Comment: [facility]  Other Topics Concern  . Not on file  Social History Narrative  . Not on file    Social Determinants of Health       Financial Resource Strain:   . Difficulty of Paying Living Expenses:   Food Insecurity:   . Worried About Charity fundraiser in the Last Year:   . Arboriculturist in the Last Year:   Transportation Needs:   . Film/video editor (Medical):   Marland Kitchen Lack of Transportation (Non-Medical):   Physical Activity:   . Days of Exercise per Week:   . Minutes of Exercise per Session:   Stress:   . Feeling of Stress :   Social Connections:   . Frequency of Communication with Friends and Family:   . Frequency of Social Gatherings with Friends and Family:   . Attends Religious Services:   . Active Member of Clubs or Organizations:   . Attends Archivist Meetings:   Marland Kitchen Marital Status:   Intimate Partner Violence:   . Fear of Current or Ex-Partner:   . Emotionally Abused:   Marland Kitchen Physically Abused:   . Sexually Abused:       Review of Systems  Constitutional: Negative.   HENT: Negative.   Eyes: Negative.   Respiratory: Negative.   Cardiovascular: Negative.   Gastrointestinal: Negative.   Endocrine: Negative.   Genitourinary: Negative.   Musculoskeletal: Negative.   Skin: Negative.   Allergic/Immunologic: Negative.   Neurological: Negative.   Hematological: Negative.   Psychiatric/Behavioral: Negative.       PHYSICAL EXAMINATION:     BP 132/78 (BP Location: Right Arm, Patient Position: Sitting, Cuff Size: Large)   Pulse 64   Temp (!) 97.5 F (36.4 C) (Temporal)   Resp 10   Ht 5' 4.5" (1.638 m)   Wt 233 lb (105.7 kg)   LMP 05/26/1993 (Within Years)   BMI 39.38 kg/m     General appearance: alert, cooperative and appears stated age Head: Normocephalic, without obvious  abnormality, atraumatic Neck: no adenopathy,  supple, symmetrical, trachea midline and thyroid normal to inspection and palpation Lungs: clear to auscultation bilaterally Breasts: normal appearance, no masses or tenderness, No nipple retraction or dimpling, No nipple discharge or bleeding, No axillary or supraclavicular adenopathy Heart: regular rate and rhythm Abdomen: vertical midline incision. Abdomen is soft, non-tender, no masses,  no organomegaly Extremities: extremities normal, atraumatic, no cyanosis or edema Skin: Skin color, texture, turgor normal. No rashes or lesions Lymph nodes: Cervical, supraclavicular, and axillary nodes normal. No abnormal inguinal nodes palpated Neurologic: Grossly normal   Pelvic: External genitalia:  no lesions              Urethra:  normal appearing urethra with no masses, tenderness or lesions              Bartholins and Skenes: normal                 Vagina: normal appearing vagina with normal color and discharge, no lesions. Second degree rectocele.               Cervix: absent                Bimanual Exam:  Uterus: absent              Adnexa: no mass, fullness, tenderness              Rectal exam: Yes.  .  Confirms.              Anus:  normal sphincter tone, no lesions   Chaperone was present for exam.   ASSESSMENT   Rectocele.  Takes Voltaren.  She denies history of colostomy.   PLAN   We discussed rectocele repair.  Risks, benefits, and alternatives have been reviewed. Patient wishes to proceed.  She will discontinue Voltaren 3 days prior to surgery.   An After Visit Summary was printed and given to the patient.          Instructions  Office Visit  08/30/2019 [place] Silva, Everardo All, MD Obstetrics and Gynecology  Rectocele Dx  Office Visit ; Referred by Neale Burly, MD Reason for Visit  Additional Documentation  Vitals:    BP 132/78 (BP Location: Right Arm, Patient Position: Sitting, Cuff Size: Large)  Pulse 64  Temp  97.5 F (36.4 C)  (Temporal)  Resp 10  Ht 5' 4.5" (1.638 m)  Wt 105.7 kg  LMP 05/26/1993 (Within Years)  BMI 39.38 kg/m  BSA 2.19 m    More Vitals  Flowsheets:    NEWS,  MEWS Score,  Anthropometrics,  Method of Visit    Encounter Info:    Billing Info,  History,  Allergies,  Detailed Report    Orthostatic Vitals Recorded in This Encounter   08/30/2019  1117     Patient Position: Sitting  BP Location: Right Arm  Cuff Size: Large  All Notes   Progress Notes by Nunzio Cobbs, MD at 08/30/2019 11:30 AM Author: Nunzio Cobbs, MD Author Type: Physician Filed: 09/03/2019 10:10 PM  Note Status: Signed Cosign: Cosign Not Required Encounter Date: 08/30/2019  Editor: Nunzio Cobbs, MD (Physician)  Prior Versions: 1. Archie Balboa CMA (Certified Psychologist, sport and exercise) at 08/30/2019 11:23 AM - Sign when Signing Visit   2. Lowella Fairy, CMA (Certified Medical Assistant) at 08/25/2019  9:05 AM - Sign when Signing Visit  GYNECOLOGY  VISIT   HPI: 64 y.o.   Married  Caucasian  female   G2P2002 with Patient's last menstrual period was 05/26/1993 (within years).   here for surgical consult.  She desires rectocele repair.  Difficulty completing bowel movements.  No fecal incontinence.    Has her second Covid vaccine 09/05/19.    She was cleared for surgery on 08/04/19 by her PCP.   She uses CPAP regularly.  A1C was about 6.1 at her preop op clearance appointment with her PCP.    GYNECOLOGIC HISTORY: Patient's last menstrual period was 05/26/1993 (within years). Contraception:  Hyst Menopausal hormone therapy: none Last mammogram: 07/04/19 - BI-RADS1, cat A density -  [facility]  Last pap smear: 2009 Neg                OB History     Gravida  2   Para  2   Term  2   Preterm      AB      Living  2      SAB      TAB      Ectopic      Multiple      Live Births                        Patient Active Problem  List    Diagnosis Date Noted  . Incarcerated incisional hernia s/p lap repair w mesh 10/02/2017 10/02/2017  . Right ovarian cyst 09/12/2017  . Heart murmur 01/01/2016  . Obesity (BMI 30-39.9) 06/27/2014  . OSA (obstructive sleep apnea) 03/24/2014  . Hyperlipidemia 01/20/2014  . DOE (dyspnea on exertion) 01/20/2014  . Chest pain 01/20/2014  . Acute medial meniscal tear 05/25/2013  . 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 Chronic back pain    . Arthritis, degenerative 09/26/2011  . Encounter for long-term (current) use of other medications 09/26/2011  . Rhinitis 07/05/2011  . HYPERLIPIDEMIA 12/21/2006  . Benign essential HTN 12/21/2006  . DIVERTICULOSIS, COLON 12/21/2006          Past Medical History:  Diagnosis Date  . Acute medial meniscal tear 05/25/2013  . Allergy    . Anxiety      regarding  upcoming breast surgery  . Arthritis, degenerative 09/26/2011  . Atypical ductal hyperplasia of left breast    . 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 Breast, lumpectomy, Left - ATYPICAL DUCTAL HYPERPLASIA, SEE COMMENT. - SURGICAL MARGINS, NEGATIVE FOR ATYPIA OR MALIGNANCY. - PREVIOUS BIOPSY SITE IDENTIFIED. - ONE LYMPH NODE, NEGATIVE FOR TUMOR (0/1), 2. Breast, excision, Left medial margin - BENIGN BREAST TISSUE, SEE COMMENT. - NEGATIVE FOR ATYPIA OR MALIGNANCY.   Marland Kitchen Chest pain 01/20/2014  . DDD (degenerative disc disease)    . Diabetes mellitus without complication ([place])    . Diverticulosis of colon    . DIVERTICULOSIS, COLON 12/21/2006    Qualifier: Diagnosis of  By: Leanne Chang MD, Bruce    . DOE (dyspnea on exertion) 01/20/2014  . Encounter for long-term (current) use of other medications 09/26/2011  . Essential hypertension 12/21/2006      Pt states she has a white coat syndrome and her BP is OK at home.     . Family history of adverse reaction to anesthesia      Anesthesia said they woke her mother up too fast and developed  A-Fib .  Marland Kitchen GERD (gastroesophageal reflux disease)    .  Gout    . Hearing loss    . Heart murmur 01/01/2016  . Hyperlipidemia    . HYPERLIPIDEMIA 12/21/2006    Qualifier: Diagnosis of  By: Leanne Chang MD, Bruce    . Hypertension    . IBS (irritable bowel syndrome)    . Incarcerated incisional hernia s/p lap repair w mesh 10/02/2017 10/02/2017  . Mild sleep apnea    . Obesity (BMI 30-39.9) 06/27/2014  . OSA (obstructive sleep apnea) 03/24/2014    Mild OSA with AHI 12.4/hr now on CPAP at 10cm H2O.  Persistent oxygen desats <88% for 11 minutes on CPAP and now on nighttime O2 at 2L via CPAP   . OSA on CPAP 06/27/2014  . PONV (postoperative nausea and vomiting)      Position carefully due to Ray-Cage back surgeries  . Right ovarian cyst 09/12/2017  . Sleep apnea      On CPAP  . Wears glasses             Past Surgical History:  Procedure Laterality Date  . ABDOMINAL HYSTERECTOMY   1994    TVH for DUB--ovaries retained    . ANTERIOR LUMBAR FUSION      . ANTERIOR LUMBAR FUSION   2018    [facility] Dr Jinny Sanders.  2 level ALIF at L4-5 and L5-S1  . BACK SURGERY   2011    lumb cyst  . BACK SURGERY   04/18/2016    Anterior and posterior spinal fusion  . BREAST SURGERY      . CERVICAL LAMINECTOMY      . COLOSTOMY   09/09/2012  . CYST REMOVAL TRUNK        LOWER BACK  . DILATION AND CURETTAGE OF UTERUS      . INSERTION OF MESH N/A 10/02/2017    Procedure: INSERTION OF MESH;  Surgeon: Michael Boston, MD;  Location: [place];  Service: General;  Laterality: N/A;  . JOINT REPLACEMENT        partial right knee  . KNEE ARTHROSCOPY      . KNEE ARTHROSCOPY Right 05/25/2013    Procedure: RIGHT ARTHROSCOPY KNEE, PARTIAL MEDIAL MENISCECTOMY, CHONDROPLASTY, PARTIAL LATERAL MENISCECTOMY;  Surgeon: Gearlean Alf, MD;  Location: [facility];  Service: Orthopedics;  Laterality: Right;  . LAPAROSCOPIC SALPINGO OOPHERECTOMY Bilateral 10/02/2017    Procedure: LAPAROSCOPIC SALPINGO OOPHORECTOMY, CYSTOSCOPY;   Surgeon: Nunzio Cobbs, MD;  Location: [facility];  Service: Gynecology;  Laterality: Bilateral;  . PARTIAL MASTECTOMY WITH NEEDLE LOCALIZATION Left 06/03/2013    Procedure: PARTIAL MASTECTOMY WITH NEEDLE LOCALIZATION;  Surgeon: [name], MD;  Location: [place];  Service: General;  Laterality: Left;  . POSTERIOR LUMBAR FUSION      . SINUS IRRIGATION      . TONSILLECTOMY      . TRIGGER FINGER RELEASE      . VENTRAL HERNIA REPAIR N/A 10/02/2017    Procedure: LAPAROSCOPIC VENTRAL WALL ASSISTED  HERNIA REPAIR WITH MESH;  Surgeon: Michael Boston, MD;  Location: [place];  Service: General;  Laterality: N/A;  . WISDOM TOOTH EXTRACTION                Current Outpatient Medications  Medication Sig Dispense Refill  . acetaminophen (PAIN RELIEVER EXTRA STRENGTH) 500 MG tablet Take 500 mg by mouth every 6 (six) hours as needed for moderate pain or headache.       . allopurinol (ZYLOPRIM) 100 MG tablet Take 2  tablets by mouth daily.      . betamethasone dipropionate 0.05 % cream APPLY TO AFFECTED AREA(S)OON SKIN TWICE DAILY AS NEEDED.      . calcium-vitamin D (OSCAL WITH D) 500-200 MG-UNIT per tablet Take 1 tablet by mouth daily with breakfast.       . diclofenac (VOLTAREN) 75 MG EC tablet Take 75 mg by mouth every evening.       . fexofenadine (ALLEGRA) 180 MG tablet Take 180 mg by mouth every evening.       . fish oil-omega-3 fatty acids 1000 MG capsule Take 1 g by mouth 2 (two) times daily.       . fluticasone (FLONASE) 50 MCG/ACT nasal spray as needed.      . gabapentin (NEURONTIN) 300 MG capsule Take 300 mg by mouth as needed.      . hydrochlorothiazide (HYDRODIURIL) 25 MG tablet Take 25 mg by mouth daily      . losartan (COZAAR) 50 MG tablet Take 25 mg by mouth daily.      . meclizine (ANTIVERT) 25 MG tablet Take 25 mg by mouth 3 (three) times daily as needed for dizziness.       . metFORMIN (GLUCOPHAGE) 500 MG tablet Take 500  mg by mouth every evening.       . methylcellulose (CITRUCEL) oral powder Take 1 packet by mouth as needed.       . mometasone (NASONEX) 50 MCG/ACT nasal spray Place 2 sprays into the nose at bedtime.       . nebivolol (BYSTOLIC) 10 MG tablet Take 10 mg by mouth every morning.      Marland Kitchen omeprazole (PRILOSEC) 20 MG capsule Take 20 mg by mouth daily as needed (for acid reflux).       . rosuvastatin (CRESTOR) 10 MG tablet Take 1 tablet by mouth 3 times a week. 15 tablet 9    No current facility-administered medications for this visit.      ALLERGIES: Demerol [meperidine], Penicillins, Sulfa antibiotics, Sulfasalazine, Triamterene, Amlodipine besylate, Arimidex [anastrozole], Lipitor [atorvastatin], Lisinopril, Propoxyphene, Simvastatin, and Topiramate        Family History  Problem Relation Age of Onset  . Heart disease Mother    . Deep vein thrombosis Mother    . Colon cancer Mother    . Rectal cancer Mother 66  . Lung disease Mother    . Colon polyps Mother    . Heart disease Father    . COPD Father    . Rheum arthritis Father    . Osteoarthritis Father    . Colon polyps Father    . Heart disease Brother    . Pulmonary embolism Brother    . Rheum arthritis Daughter    . Esophageal cancer Neg Hx    . Stomach cancer Neg Hx        Social History         Socioeconomic History  . Marital status: Married      Spouse name: Not on file  . Number of children: Not on file  . Years of education: Not on file  . Highest education level: Not on file  Occupational History  . Not on file  Tobacco Use  . Smoking status: Never Smoker  . Smokeless tobacco: Never Used  Substance and Sexual Activity  . Alcohol use: No  . Drug use: No  . Sexual activity: Yes      Partners: Male      Comment: [facility]  Other  Topics Concern  . Not on file  Social History Narrative  . Not on file    Social Determinants of Health       Financial Resource Strain:   . Difficulty of Paying Living Expenses:     Food Insecurity:   . Worried About Charity fundraiser in the Last Year:   . Arboriculturist in the Last Year:   Transportation Needs:   . Film/video editor (Medical):   Marland Kitchen Lack of Transportation (Non-Medical):   Physical Activity:   . Days of Exercise per Week:   . Minutes of Exercise per Session:   Stress:   . Feeling of Stress :   Social Connections:   . Frequency of Communication with Friends and Family:   . Frequency of Social Gatherings with Friends and Family:   . Attends Religious Services:   . Active Member of Clubs or Organizations:   . Attends Archivist Meetings:   Marland Kitchen Marital Status:   Intimate Partner Violence:   . Fear of Current or Ex-Partner:   . Emotionally Abused:   Marland Kitchen Physically Abused:   . Sexually Abused:       Review of Systems  Constitutional: Negative.   HENT: Negative.   Eyes: Negative.   Respiratory: Negative.   Cardiovascular: Negative.   Gastrointestinal: Negative.   Endocrine: Negative.   Genitourinary: Negative.   Musculoskeletal: Negative.   Skin: Negative.   Allergic/Immunologic: Negative.   Neurological: Negative.   Hematological: Negative.   Psychiatric/Behavioral: Negative.       PHYSICAL EXAMINATION:     BP 132/78 (BP Location: Right Arm, Patient Position: Sitting, Cuff Size: Large)   Pulse 64   Temp (!) 97.5 F (36.4 C) (Temporal)   Resp 10   Ht 5' 4.5" (1.638 m)   Wt 233 lb (105.7 kg)   LMP 05/26/1993 (Within Years)   BMI 39.38 kg/m     General appearance: alert, cooperative and appears stated age Head: Normocephalic, without obvious abnormality, atraumatic Neck: no adenopathy, supple, symmetrical, trachea midline and thyroid normal to inspection and palpation Lungs: clear to auscultation bilaterally Breasts: normal appearance, no masses or tenderness, No nipple retraction or dimpling, No nipple discharge or bleeding, No axillary or supraclavicular adenopathy Heart: regular rate and rhythm Abdomen:  vertical midline incision. Abdomen is soft, non-tender, no masses,  no organomegaly Extremities: extremities normal, atraumatic, no cyanosis or edema Skin: Skin color, texture, turgor normal. No rashes or lesions Lymph nodes: Cervical, supraclavicular, and axillary nodes normal. No abnormal inguinal nodes palpated Neurologic: Grossly normal   Pelvic: External genitalia:  no lesions              Urethra:  normal appearing urethra with no masses, tenderness or lesions              Bartholins and Skenes: normal                 Vagina: normal appearing vagina with normal color and discharge, no lesions. Second degree rectocele.               Cervix: absent                Bimanual Exam:  Uterus: absent              Adnexa: no mass, fullness, tenderness              Rectal exam: Yes.  .  Confirms.  Anus:  normal sphincter tone, no lesions   Chaperone was present for exam.   ASSESSMENT   Rectocele.  Takes Voltaren.  She denies history of colostomy.   PLAN   We discussed rectocele repair.  Risks, benefits, and alternatives have been reviewed. Patient wishes to proceed.  She will discontinue Voltaren 3 days prior to surgery.   An After Visit Summary was printed and given to the patient.          Instructions

## 2019-09-27 NOTE — Anesthesia Procedure Notes (Signed)
Procedure Name: Intubation Date/Time: 09/27/2019 7:35 AM Performed by: Gerald Leitz, CRNA Pre-anesthesia Checklist: Patient identified, Patient being monitored, Timeout performed, Emergency Drugs available and Suction available Patient Re-evaluated:Patient Re-evaluated prior to induction Oxygen Delivery Method: Circle system utilized Preoxygenation: Pre-oxygenation with 100% oxygen Induction Type: IV induction Ventilation: Mask ventilation without difficulty Laryngoscope Size: Mac and 3 Grade View: Grade I Tube type: Oral Tube size: 7.0 mm Number of attempts: 1 Placement Confirmation: ETT inserted through vocal cords under direct vision,  positive ETCO2 and breath sounds checked- equal and bilateral Secured at: 21 cm Tube secured with: Tape Dental Injury: Teeth and Oropharynx as per pre-operative assessment

## 2019-09-27 NOTE — Progress Notes (Signed)
Update to History and Physical  No marked change in status since office pre-op visit.   Patient examined.   OK to proceed with surgery. 

## 2019-09-27 NOTE — Discharge Instructions (Signed)
Posterior Colporrhaphy and Enterocele Repair, Care After This sheet gives you information about how to care for yourself after your procedure. Your health care provider may also give you more specific instructions. If you have problems or questions, contact your health care provider. What can I expect after the procedure? After the procedure, it is common to have:  Pain in the surgical area.  Vaginal spotting and discharge. You will need to use a sanitary pad during this time.  Fatigue. Follow these instructions at home:  Incision care   Follow instructions from your health care provider about how to take care of your incision. Make sure you: ? Wash your hands with soap and water before touching the incision area. If soap and water are not available, use hand sanitizer. ? Clean your incision as told by your health care provider. ? Leave stitches (sutures), skin glue, or adhesive strips in place. These skin closures may need to stay in place for 2 weeks or longer. If adhesive strip edges start to loosen and curl up, you may trim the loose edges. Do not remove adhesive strips completely unless your health care provider tells you to do that.  Check your incision area every day for signs of infection. Check for: ? Redness, swelling, or pain. ? Fluid or blood. ? Warmth. ? Pus or a bad smell.  Check your incision every day to make sure the incision area is not separating or opening up.  Do not take baths, swim, or use a hot tub until your health care provider approves. You may shower.  Keep the area between your vagina and rectum (perineal area) clean and dry. Make sure you clean the area after every bowel movement and each time you urinate.  Ask your health care provider if you can take a sitz bath or sit in a tub of clean, warm water. Activity  Take frequent, short walks followed by rest periods throughout the day.  Avoid activities that take a lot of effort (are strenuous).  Do  not lift anything that is heavier than 10 lb (4.5 kg), or the limit that your health care provider tells you, until he or she says that it is safe. Avoid pushing or pulling motions.  Avoid standing for long periods of time.  Do not douche, use tampons, or have sex until your health care provider says it is okay.  Return to your normal activities as told by your health care provider. Ask your health care provider what activities are safe for you.  Do not drive until your health care provider approves. To prevent constipation To prevent or treat constipation while you are taking prescription pain medicine, your health care provider may recommend that you:  Take over-the-counter or prescription medicines.  Eat foods that are high in fiber, such as fresh fruits and vegetables, whole grains, and beans.  Drink enough fluid to keep your urine clear or pale yellow.  Limit foods that are high in fat and processed sugars, such as fried and sweet foods. General instructions  Take over-the-counter and prescription medicines only as told by your health care provider.  You may be instructed to do pelvic floor exercises (kegels) as told by your health care provider.  Do not drive or use heavy machinery while taking prescription pain medicine.  Wear compression stockings as told by your health care provider. These stockings help to prevent blood clots and reduce swelling in your legs.  Keep all follow-up visits as told by your health care  provider. This is important. Contact a health care provider if:  Medicine does not help your pain.  You have frequent or urgent urination, or you are unable to completely empty your bladder.  You feel a burning sensation when urinating.  You have pus or a bad smell coming from your vaginal area.  You have redness, swelling, or increasing pain in the vaginal area. Get help right away if:  You have increased bleeding from the vaginal area.  You cannot  urinate.  You have a fever or chills.  Your incision separates or opens.  You have trouble breathing. Summary  After the procedure, it is common to have pain, fatigue, spotting, and discharge from the vagina.  Keep the area between your vagina and rectum (perineal area) clean and dry. Make sure you clean the area after every bowel movement and each time you urinate.  Follow instructions from your health care provider about any activity restrictions after the procedure. This information is not intended to replace advice given to you by your health care provider. Make sure you discuss any questions you have with your health care provider. Document Revised: 04/24/2017 Document Reviewed: 06/19/2016 Elsevier Patient Education  Pella, ALEVE, MOTRIN, IBUPROFEN UNTIL 245 TODAY  Post Anesthesia Home Care Instructions  Activity: Get plenty of rest for the remainder of the day. A responsible individual must stay with you for 24 hours following the procedure.  For the next 24 hours, DO NOT: -Drive a car -Paediatric nurse -Drink alcoholic beverages -Take any medication unless instructed by your physician -Make any legal decisions or sign important papers.  Meals: Start with liquid foods such as gelatin or soup. Progress to regular foods as tolerated. Avoid greasy, spicy, heavy foods. If nausea and/or vomiting occur, drink only clear liquids until the nausea and/or vomiting subsides. Call your physician if vomiting continues.  Special Instructions/Symptoms: Your throat may feel dry or sore from the anesthesia or the breathing tube placed in your throat during surgery. If this causes discomfort, gargle with warm salt water. The discomfort should disappear within 24 hours.  If you had a scopolamine patch placed behind your ear for the management of post- operative nausea and/or vomiting:  1. The medication in the patch is effective for 72 hours, after which it should  be removed.  Wrap patch in a tissue and discard in the trash. Wash hands thoroughly with soap and water. 2. You may remove the patch earlier than 72 hours if you experience unpleasant side effects which may include dry mouth, dizziness or visual disturbances. 3. Avoid touching the patch. Wash your hands with soap and water after contact with the patch.

## 2019-09-27 NOTE — Op Note (Signed)
OPERATIVE REPORT  PREOPERATIVE DIAGNOSES:  Rectocele, possible enterocele.  POSTOPERATIVE DIAGNOSES:  Rectocele, enterocele.  PROCEDURES:  Posterior colporrhaphy, enterocele repair.  SURGEON:  Lenard Galloway, M.D.  ASSISTANT:   Dorothy Spark, M.D.  ANESTHESIA:   LMA, local with 1% lidocaine with epinephrine, 1:100,000.  EBL:   25 cc  URINE OUTPUT:  300 cc  IV FLUIDS:   123XX123 cc LR  COMPLICATIONS:  None.  INDICATIONS FOR THE PROCEDURE:  The patient is a  gravida 2, para 2 Caucasian female, status post total vaginal hysterectomy  who presents with a second degree rectocele with possible enterocele.  The patient desires surgical  repair and a plan is made to proceed now with an a posterior colporrhaphy and possible enterocele repair.  Risks, benefits, and alternatives are reviewed with the patient,   who wishes to proceed.  FINDINGS:  Examination under anesthesia revealed   SPECIMENS:  None.  DESCRIPTION OF PROCEDURE:  The patient was reidentified in the preoperative hold area.  She received Flagyl and Ciprofloxacin for antibiotic prophylaxis, IV.   She received TED hose and PAS stockings for DVT prophylaxis.  The patient was transferred to the operating room where she was placed in the dorsal lithotomy position with Allen stirrups.  General endotracheal anesthesia was induced.  The patient's lower abdomen, vagina and perineum were then sterilely prepped and she was draped.  A Foley catheter was sterilely placed inside the bladder and left to gravity drainage  throughout the procedure and was removed at the termination of the procedure.  An examination under anesthesia was performed.  The posterior colporrhaphy and enterocele were repaired.   Allis clamps were used to mark the perineal body and then the posterior vaginal mucosa up to the level of the vaginal apex.  The perineal body and mucosa were injected locally with 1% lidocaine with epinephrine, 1:100,000.  A  triangular wedge of epithelium was excised from the perineal body and the posterior mucosa was incised vertically in the midline with a Metzenbaum scissors.  Sharp and blunt dissection were used to dissect the perirectal fascia off the vaginal mucosa bilaterally.    The enterocele was encountered.  The enterocele sac was elevated with  hemostat clamps, and the sac was entered sharply with a Metzenbaum  scissors.  There were extensive adhesions of the omentum and the small bowel to the peritoneum. Lysis of adhesions was performed sharply with the  scissors.  The enterocele sac was closed with a purse string suture of 2/0 Vicryl.  The rectocele was reduced with vertical mattress sutures of 0 Vicryl.   The rectocele reduced nicely.  Excess vaginal mucosa was trimmed and  the posterior vaginal wall was closed with a running lock suture of 2-0 Vicryl down to the hymen.  This suture was then brought behind the hymen. The 2-0 Vicryl was used to then place running sutures along the  transverse superficial perineal muscles.  This suture was then brought up the  perineum in a subcuticular fashion and the knot was tied at the hymen as for an  episiotomy-type repair.  Rectal exam was performed and there was no evidence of any suture or foreign body in the rectum.  There was good support and elevation to the anterior apical and posterior vaginal walls.     The patient was awakened and extubated, and escorted to the recovery room in stable condition.  There were no complications.  All needle, instrument, and sponge counts were correct.  Lenard Galloway,  M.D.  

## 2019-09-27 NOTE — Transfer of Care (Signed)
Immediate Anesthesia Transfer of Care Note  Patient: Ann Fernandez  Procedure(s) Performed: Procedure(s): POSTERIOR REPAIR (RECTOCELE)and  Enterocele (N/A)  Patient Location: PACU  Anesthesia Type:General  Level of Consciousness: Alert, Awake, Oriented  Airway & Oxygen Therapy: Patient Spontanous Breathing  Post-op Assessment: Report given to RN  Post vital signs: Reviewed and stable  Last Vitals:  Vitals:   09/27/19 0540 09/27/19 0918  BP: (!) 158/75 (!) 152/78  Pulse: 61 88  Resp: 18 16  Temp: 36.7 C 36.4 C  SpO2: [id number]     Complications: No apparent anesthesia complications

## 2019-09-27 NOTE — Anesthesia Postprocedure Evaluation (Signed)
Anesthesia Post Note  Patient: Ann Fernandez  Procedure(s) Performed: POSTERIOR REPAIR (RECTOCELE)and  Enterocele (N/A Vagina )     Patient location during evaluation: PACU Anesthesia Type: General Level of consciousness: awake and alert Pain management: pain level controlled Vital Signs Assessment: post-procedure vital signs reviewed and stable Respiratory status: spontaneous breathing, nonlabored ventilation, respiratory function stable and patient connected to nasal cannula oxygen Cardiovascular status: blood pressure returned to baseline and stable Postop Assessment: no apparent nausea or vomiting Anesthetic complications: no    Last Vitals:  Vitals:   09/27/19 0918 09/27/19 1130  BP: (!) 152/78 (!) 150/86  Pulse: 88 82  Resp: 16 16  Temp: 36.4 C (!) 36.4 C  SpO2: 98% 99%    Last Pain:  Vitals:   09/27/19 1130  TempSrc:   PainSc: 0-No pain                 Agueda Houpt

## 2019-10-03 ENCOUNTER — Other Ambulatory Visit: Payer: Self-pay

## 2019-10-04 ENCOUNTER — Encounter: Payer: Self-pay | Admitting: Obstetrics and Gynecology

## 2019-10-04 ENCOUNTER — Ambulatory Visit (INDEPENDENT_AMBULATORY_CARE_PROVIDER_SITE_OTHER): Payer: BC Managed Care – PPO | Admitting: Obstetrics and Gynecology

## 2019-10-04 VITALS — BP 160/84 | HR 76 | Temp 97.3°F | Ht 64.25 in | Wt 232.6 lb

## 2019-10-04 DIAGNOSIS — Z9889 Other specified postprocedural states: Secondary | ICD-10-CM

## 2019-10-04 NOTE — Progress Notes (Signed)
GYNECOLOGY  VISIT   HPI: 64 y.o.   Married  Caucasian  female   G2P2002 with Patient's last menstrual period was 05/26/1993 (within years).   here for 1 week follow up POSTERIOR REPAIR (RECTOCELE)and Enterocele (N/A Vagina ) procedure.  Took only 2 1/2 to 3 pain pills.   She was using Citrocel and Senna-S and having multiple BMs daily. Now just taking Citrocel.  Having BMs once a day.   No vaginal bleeding.   Relieved to have surgery done.   GYNECOLOGIC HISTORY: Patient's last menstrual period was 05/26/1993 (within years). Contraception:  Hyst Menopausal hormone therapy:  none Last mammogram:  07/04/19 - BI-RADS1, cat A density - [facility]  Last pap smear: 2009 Neg        OB History    Gravida  2   Para  2   Term  2   Preterm      AB      Living  2     SAB      TAB      Ectopic      Multiple      Live Births                 Patient Active Problem List   Diagnosis Date Noted  . Incarcerated incisional hernia s/p lap repair w mesh 10/02/2017 10/02/2017  . Right ovarian cyst 09/12/2017  . Heart murmur 01/01/2016  . Obesity (BMI 30-39.9) 06/27/2014  . OSA (obstructive sleep apnea) 03/24/2014  . Hyperlipidemia 01/20/2014  . DOE (dyspnea on exertion) 01/20/2014  . Chest pain 01/20/2014  . Acute medial meniscal tear 05/25/2013  . 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 Chronic back pain   . Arthritis, degenerative 09/26/2011  . Encounter for long-term (current) use of other medications 09/26/2011  . Rhinitis 07/05/2011  . HYPERLIPIDEMIA 12/21/2006  . Benign essential HTN 12/21/2006  . DIVERTICULOSIS, COLON 12/21/2006    Past Medical History:  Diagnosis Date  . Acute medial meniscal tear 05/25/2013  . Allergy   . Anxiety    regarding  upcoming breast surgery  . Arthritis, degenerative 09/26/2011  . Atypical ductal hyperplasia of left breast   . 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  Breast, lumpectomy, Left - ATYPICAL DUCTAL HYPERPLASIA, SEE COMMENT. - SURGICAL MARGINS, NEGATIVE FOR ATYPIA OR MALIGNANCY. - PREVIOUS BIOPSY SITE IDENTIFIED. - ONE LYMPH NODE, NEGATIVE FOR TUMOR (0/1), 2. Breast, excision, Left medial margin - BENIGN BREAST TISSUE, SEE COMMENT. - NEGATIVE FOR ATYPIA OR MALIGNANCY.   Marland Kitchen Chest pain 01/20/2014  . DDD (degenerative disc disease)   . Diabetes mellitus without complication ([place])   . Diverticulosis of colon   . DIVERTICULOSIS, COLON 12/21/2006   Qualifier: Diagnosis of  By: Leanne Chang MD, Bruce    . DOE (dyspnea on exertion) 01/20/2014  . Encounter for long-term (current) use of other medications 09/26/2011  . Essential hypertension 12/21/2006     Pt states she has a white coat syndrome and her BP is OK at home.     . Family history of adverse reaction to anesthesia    Anesthesia said they woke her mother up too fast and developed A-Fib .  Marland Kitchen GERD (gastroesophageal reflux disease)   . Gout   . Hearing loss   . Heart murmur 01/01/2016  . Hyperlipidemia   . HYPERLIPIDEMIA 12/21/2006   Qualifier: Diagnosis of  By: Leanne Chang MD, Bruce    . Hypertension   . IBS (  irritable bowel syndrome)   . Incarcerated incisional hernia s/p lap repair w mesh 10/02/2017 10/02/2017  . Mild sleep apnea   . Obesity (BMI 30-39.9) 06/27/2014  . OSA (obstructive sleep apnea) 03/24/2014   Mild OSA with AHI 12.4/hr now on CPAP at 10cm H2O.  Persistent oxygen desats <88% for 11 minutes on CPAP and now on nighttime O2 at 2L via CPAP   . OSA on CPAP 06/27/2014  . PONV (postoperative nausea and vomiting)    Position carefully due to Ray-Cage back surgeries  . Right ovarian cyst 09/12/2017  . Sleep apnea    On CPAP  . Wears glasses     Past Surgical History:  Procedure Laterality Date  . ABDOMINAL HYSTERECTOMY  1994   TVH for DUB--ovaries retained    . ANTERIOR LUMBAR FUSION    . ANTERIOR LUMBAR FUSION  2018   [facility] Dr Jinny Sanders.  2 level ALIF at L4-5 and L5-S1  . BACK SURGERY  2011   lumb  cyst  . BACK SURGERY  04/18/2016   Anterior and posterior spinal fusion  . BREAST SURGERY    . CERVICAL LAMINECTOMY    . COLOSTOMY  09/09/2012  . CYST REMOVAL TRUNK     LOWER BACK  . DILATION AND CURETTAGE OF UTERUS    . INSERTION OF MESH N/A 10/02/2017   Procedure: INSERTION OF MESH;  Surgeon: Michael Boston, MD;  Location: [place];  Service: General;  Laterality: N/A;  . JOINT REPLACEMENT     partial right knee  . KNEE ARTHROSCOPY    . KNEE ARTHROSCOPY Right 05/25/2013   Procedure: RIGHT ARTHROSCOPY KNEE, PARTIAL MEDIAL MENISCECTOMY, CHONDROPLASTY, PARTIAL LATERAL MENISCECTOMY;  Surgeon: Gearlean Alf, MD;  Location: [facility];  Service: Orthopedics;  Laterality: Right;  . LAPAROSCOPIC SALPINGO OOPHERECTOMY Bilateral 10/02/2017   Procedure: LAPAROSCOPIC SALPINGO OOPHORECTOMY, CYSTOSCOPY;  Surgeon: Nunzio Cobbs, MD;  Location: [facility];  Service: Gynecology;  Laterality: Bilateral;  . PARTIAL MASTECTOMY WITH NEEDLE LOCALIZATION Left 06/03/2013   Procedure: PARTIAL MASTECTOMY WITH NEEDLE LOCALIZATION;  Surgeon: [name], MD;  Location: [place];  Service: General;  Laterality: Left;  . POSTERIOR LUMBAR FUSION    . RECTOCELE REPAIR N/A 09/27/2019   Procedure: POSTERIOR REPAIR (RECTOCELE)and  Enterocele;  Surgeon: Nunzio Cobbs, MD;  Location: [facility];  Service: Gynecology;  Laterality: N/A;  . SINUS IRRIGATION    . TONSILLECTOMY    . TRIGGER FINGER RELEASE    . VENTRAL HERNIA REPAIR N/A 10/02/2017   Procedure: LAPAROSCOPIC VENTRAL WALL ASSISTED  HERNIA REPAIR WITH MESH;  Surgeon: Michael Boston, MD;  Location: [place];  Service: General;  Laterality: N/A;  . WISDOM TOOTH EXTRACTION      Current Outpatient Medications  Medication Sig Dispense Refill  . allopurinol (ZYLOPRIM) 100 MG tablet Take 2 tablets by mouth daily.    . calcium-vitamin D (OSCAL WITH D) 500-200 MG-UNIT  per tablet Take 1 tablet by mouth daily with breakfast.     . diclofenac (VOLTAREN) 75 MG EC tablet Take 75 mg by mouth every evening.     . fexofenadine (ALLEGRA) 180 MG tablet Take 180 mg by mouth every evening.     . fish oil-omega-3 fatty acids 1000 MG capsule Take 1 g by mouth 2 (two) times daily.     . fluticasone (FLONASE) 50 MCG/ACT nasal spray as needed.    . hydrochlorothiazide (HYDRODIURIL) 25 MG tablet  Take 25 mg by mouth daily    . losartan (COZAAR) 50 MG tablet Take 25 mg by mouth daily.    . meclizine (ANTIVERT) 25 MG tablet Take 25 mg by mouth 3 (three) times daily as needed for dizziness.     . metFORMIN (GLUCOPHAGE) 500 MG tablet Take 500 mg by mouth every evening.     . methylcellulose (CITRUCEL) oral powder Take 1 packet by mouth as needed.     . mometasone (NASONEX) 50 MCG/ACT nasal spray Place 2 sprays into the nose at bedtime.     . nebivolol (BYSTOLIC) 10 MG tablet Take 10 mg by mouth every morning.    Marland Kitchen omeprazole (PRILOSEC) 20 MG capsule Take 20 mg by mouth daily as needed (for acid reflux).     . rosuvastatin (CRESTOR) 10 MG tablet Take 1 tablet by mouth 3 times a week. 15 tablet 9   No current facility-administered medications for this visit.     ALLERGIES: Demerol [meperidine], Penicillins, Sulfa antibiotics, Sulfasalazine, Triamterene, Amlodipine besylate, Arimidex [anastrozole], Lipitor [atorvastatin], Lisinopril, Propoxyphene, Simvastatin, and Topiramate  Family History  Problem Relation Age of Onset  . Heart disease Mother   . Deep vein thrombosis Mother   . Colon cancer Mother   . Rectal cancer Mother 66  . Lung disease Mother   . Colon polyps Mother   . Heart disease Father   . COPD Father   . Rheum arthritis Father   . Osteoarthritis Father   . Colon polyps Father   . Heart disease Brother   . Pulmonary embolism Brother   . Rheum arthritis Daughter   . Esophageal cancer Neg Hx   . Stomach cancer Neg Hx     Social History   Socioeconomic  History  . Marital status: Married    Spouse name: Not on file  . Number of children: Not on file  . Years of education: Not on file  . Highest education level: Not on file  Occupational History  . Not on file  Tobacco Use  . Smoking status: Never Smoker  . Smokeless tobacco: Never Used  Substance and Sexual Activity  . Alcohol use: No  . Drug use: No  . Sexual activity: Yes    Partners: Male    Comment: TVH  Other Topics Concern  . Not on file  Social History Narrative  . Not on file   Social Determinants of Health   Financial Resource Strain:   . Difficulty of Paying Living Expenses:   Food Insecurity:   . Worried About Charity fundraiser in the Last Year:   . Arboriculturist in the Last Year:   Transportation Needs:   . Film/video editor (Medical):   Marland Kitchen Lack of Transportation (Non-Medical):   Physical Activity:   . Days of Exercise per Week:   . Minutes of Exercise per Session:   Stress:   . Feeling of Stress :   Social Connections:   . Frequency of Communication with Friends and Family:   . Frequency of Social Gatherings with Friends and Family:   . Attends Religious Services:   . Active Member of Clubs or Organizations:   . Attends Archivist Meetings:   Marland Kitchen Marital Status:   Intimate Partner Violence:   . Fear of Current or Ex-Partner:   . Emotionally Abused:   Marland Kitchen Physically Abused:   . Sexually Abused:     Review of Systems  All other systems reviewed and are  negative.   PHYSICAL EXAMINATION:    BP (!) 160/84 (Cuff Size: Large)   Pulse 76   Temp (!) 97.3 F (36.3 C) (Temporal)   Ht 5' 4.25" (1.632 m)   Wt 232 lb 9.6 oz (105.5 kg)   LMP 05/26/1993 (Within Years)   BMI 39.62 kg/m     General appearance: alert, cooperative and appears stated age    Pelvic: External genitalia:  no lesions              Urethra:  normal appearing urethra with no masses, tenderness or lesions                           Bimanual Exam:  Uterus:   Absent.  Vaginal suture line intact on posterior vaginal wall.              Adnexa: no mass, fullness, tenderness          Chaperone was present for exam.  ASSESSMENT  Status post enterocele and rectocele repair. Doing well post op.  PLAN  Surgical findings and procedure reviewed with patient.  Continue decreased activity for 12 weeks post op.  Nothing in the vaginal for 12 weeks.  Keep 6 week post op appointment.  Call for any problems.    An After Visit Summary was printed and given to the patient.

## 2019-10-25 ENCOUNTER — Ambulatory Visit: Payer: BC Managed Care – PPO | Admitting: Obstetrics and Gynecology

## 2019-11-08 ENCOUNTER — Other Ambulatory Visit: Payer: Self-pay

## 2019-11-08 ENCOUNTER — Ambulatory Visit (INDEPENDENT_AMBULATORY_CARE_PROVIDER_SITE_OTHER): Payer: BC Managed Care – PPO | Admitting: Obstetrics and Gynecology

## 2019-11-08 ENCOUNTER — Encounter: Payer: Self-pay | Admitting: Obstetrics and Gynecology

## 2019-11-08 VITALS — BP 136/80 | HR 70 | Temp 97.3°F | Resp 14 | Ht 64.75 in | Wt 235.8 lb

## 2019-11-08 DIAGNOSIS — Z9889 Other specified postprocedural states: Secondary | ICD-10-CM

## 2019-11-08 NOTE — Progress Notes (Signed)
GYNECOLOGY  VISIT   HPI: 64 y.o.   Married  Caucasian  female   G2P2002 with Patient's last menstrual period was 05/26/1993 (within years).   here for 6 week follow up   POSTERIOR REPAIR (RECTOCELE)and Enterocele (N/A Vagina ).  No bleeding or discharge.  BM daily.  Some LLQ discomfort which is relieved with a BM. Not feeling any more vaginal and lower abdominal pressure.   She has some chronic rhinitis and bronchitis.  Enjoying her garden.    GYNECOLOGIC HISTORY: Patient's last menstrual period was 05/26/1993 (within years). Contraception: Hyst Menopausal hormone therapy:  none Last mammogram:  07/04/19 - BI-RADS1, cat A density - [facility]   Last pap smear:2009 Neg        OB History    Gravida  2   Para  2   Term  2   Preterm      AB      Living  2     SAB      TAB      Ectopic      Multiple      Live Births                 Patient Active Problem List   Diagnosis Date Noted  . Incarcerated incisional hernia s/p lap repair w mesh 10/02/2017 10/02/2017  . Right ovarian cyst 09/12/2017  . Heart murmur 01/01/2016  . Obesity (BMI 30-39.9) 06/27/2014  . OSA (obstructive sleep apnea) 03/24/2014  . Hyperlipidemia 01/20/2014  . DOE (dyspnea on exertion) 01/20/2014  . Chest pain 01/20/2014  . Acute medial meniscal tear 05/25/2013  . 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 Chronic back pain   . Arthritis, degenerative 09/26/2011  . Encounter for long-term (current) use of other medications 09/26/2011  . Rhinitis 07/05/2011  . HYPERLIPIDEMIA 12/21/2006  . Benign essential HTN 12/21/2006  . DIVERTICULOSIS, COLON 12/21/2006    Past Medical History:  Diagnosis Date  . Acute medial meniscal tear 05/25/2013  . Allergy   . Anxiety    regarding  upcoming breast surgery  . Arthritis, degenerative 09/26/2011  . Atypical ductal hyperplasia of left breast   . 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 Breast, lumpectomy, Left - ATYPICAL DUCTAL HYPERPLASIA, SEE COMMENT. - SURGICAL MARGINS, NEGATIVE FOR ATYPIA OR MALIGNANCY. - PREVIOUS BIOPSY SITE IDENTIFIED. - ONE LYMPH NODE, NEGATIVE FOR TUMOR (0/1), 2. Breast, excision, Left medial margin - BENIGN BREAST TISSUE, SEE COMMENT. - NEGATIVE FOR ATYPIA OR MALIGNANCY.   Marland Kitchen Chest pain 01/20/2014  . DDD (degenerative disc disease)   . Diabetes mellitus without complication ([place])   . Diverticulosis of colon   . DIVERTICULOSIS, COLON 12/21/2006   Qualifier: Diagnosis of  By: Leanne Chang MD, Bruce    . DOE (dyspnea on exertion) 01/20/2014  . Encounter for long-term (current) use of other medications 09/26/2011  . Essential hypertension 12/21/2006     Pt states she has a white coat syndrome and her BP is OK at home.     . Family history of adverse reaction to anesthesia    Anesthesia said they woke her mother up too fast and developed A-Fib .  Marland Kitchen GERD (gastroesophageal reflux disease)   . Gout   . Hearing loss   . Heart murmur 01/01/2016  . Hyperlipidemia   . HYPERLIPIDEMIA 12/21/2006   Qualifier: Diagnosis of  By: Leanne Chang MD, Bruce    . Hypertension   . IBS (irritable bowel syndrome)   .  Incarcerated incisional hernia s/p lap repair w mesh 10/02/2017 10/02/2017  . Mild sleep apnea   . Obesity (BMI 30-39.9) 06/27/2014  . OSA (obstructive sleep apnea) 03/24/2014   Mild OSA with AHI 12.4/hr now on CPAP at 10cm H2O.  Persistent oxygen desats <88% for 11 minutes on CPAP and now on nighttime O2 at 2L via CPAP   . OSA on CPAP 06/27/2014  . PONV (postoperative nausea and vomiting)    Position carefully due to Ray-Cage back surgeries  . Right ovarian cyst 09/12/2017  . Sleep apnea    On CPAP  . Wears glasses     Past Surgical History:  Procedure Laterality Date  . ABDOMINAL HYSTERECTOMY  1994   TVH for DUB--ovaries retained    . ANTERIOR LUMBAR FUSION    . ANTERIOR LUMBAR FUSION  2018   [facility] Dr Jinny Sanders.  2 level ALIF at L4-5 and L5-S1  . BACK SURGERY  2011    lumb cyst  . BACK SURGERY  04/18/2016   Anterior and posterior spinal fusion  . BREAST SURGERY    . CERVICAL LAMINECTOMY    . COLOSTOMY  09/09/2012  . CYST REMOVAL TRUNK     LOWER BACK  . DILATION AND CURETTAGE OF UTERUS    . INSERTION OF MESH N/A 10/02/2017   Procedure: INSERTION OF MESH;  Surgeon: Michael Boston, MD;  Location: [place];  Service: General;  Laterality: N/A;  . JOINT REPLACEMENT     partial right knee  . KNEE ARTHROSCOPY    . KNEE ARTHROSCOPY Right 05/25/2013   Procedure: RIGHT ARTHROSCOPY KNEE, PARTIAL MEDIAL MENISCECTOMY, CHONDROPLASTY, PARTIAL LATERAL MENISCECTOMY;  Surgeon: Gearlean Alf, MD;  Location: [facility];  Service: Orthopedics;  Laterality: Right;  . LAPAROSCOPIC SALPINGO OOPHERECTOMY Bilateral 10/02/2017   Procedure: LAPAROSCOPIC SALPINGO OOPHORECTOMY, CYSTOSCOPY;  Surgeon: Nunzio Cobbs, MD;  [facility];  Service: Gynecology;  Laterality: Bilateral;  . PARTIAL MASTECTOMY WITH NEEDLE LOCALIZATION Left 06/03/2013   Procedure: PARTIAL MASTECTOMY WITH NEEDLE LOCALIZATION;  Surgeon: [name], MD;  Location: [place];  Service: General;  Laterality: Left;  . POSTERIOR LUMBAR FUSION    . RECTOCELE REPAIR N/A 09/27/2019   Procedure: POSTERIOR REPAIR (RECTOCELE)and  Enterocele;  Surgeon: Nunzio Cobbs, MD;  Location: [facility];  Service: Gynecology;  Laterality: N/A;  . SINUS IRRIGATION    . TONSILLECTOMY    . TRIGGER FINGER RELEASE    . VENTRAL HERNIA REPAIR N/A 10/02/2017   Procedure: LAPAROSCOPIC VENTRAL WALL ASSISTED  HERNIA REPAIR WITH MESH;  Surgeon: Michael Boston, MD;  Location: [place];  Service: General;  Laterality: N/A;  . WISDOM TOOTH EXTRACTION      Current Outpatient Medications  Medication Sig Dispense Refill  . allopurinol (ZYLOPRIM) 100 MG tablet Take 2 tablets by mouth daily.    . calcium-vitamin D (OSCAL WITH D) 500-200  MG-UNIT per tablet Take 1 tablet by mouth daily with breakfast.     . diclofenac (VOLTAREN) 75 MG EC tablet Take 75 mg by mouth every evening.     . fexofenadine (ALLEGRA) 180 MG tablet Take 180 mg by mouth every evening.     . fish oil-omega-3 fatty acids 1000 MG capsule Take 1 g by mouth 2 (two) times daily.     . fluticasone (FLONASE) 50 MCG/ACT nasal spray as needed.    . hydrochlorothiazide (HYDRODIURIL) 25 MG tablet Take 25 mg by mouth daily    .  losartan (COZAAR) 50 MG tablet Take 25 mg by mouth daily.    . meclizine (ANTIVERT) 25 MG tablet Take 25 mg by mouth 3 (three) times daily as needed for dizziness.     . metFORMIN (GLUCOPHAGE) 500 MG tablet Take 500 mg by mouth every evening.     . methylcellulose (CITRUCEL) oral powder Take 1 packet by mouth as needed.     . mometasone (NASONEX) 50 MCG/ACT nasal spray Place 2 sprays into the nose at bedtime.     . nebivolol (BYSTOLIC) 10 MG tablet Take 10 mg by mouth every morning.    Marland Kitchen omeprazole (PRILOSEC) 20 MG capsule Take 20 mg by mouth daily as needed (for acid reflux).     . rosuvastatin (CRESTOR) 10 MG tablet Take 1 tablet by mouth 3 times a week. 15 tablet 9   No current facility-administered medications for this visit.     ALLERGIES: Demerol [meperidine], Penicillins, Sulfa antibiotics, Sulfasalazine, Triamterene, Amlodipine besylate, Arimidex [anastrozole], Lipitor [atorvastatin], Lisinopril, Propoxyphene, Simvastatin, and Topiramate  Family History  Problem Relation Age of Onset  . Heart disease Mother   . Deep vein thrombosis Mother   . Colon cancer Mother   . Rectal cancer Mother 41  . Lung disease Mother   . Colon polyps Mother   . Heart disease Father   . COPD Father   . Rheum arthritis Father   . Osteoarthritis Father   . Colon polyps Father   . Heart disease Brother   . Pulmonary embolism Brother   . Rheum arthritis Daughter   . Esophageal cancer Neg Hx   . Stomach cancer Neg Hx     Social History    Socioeconomic History  . Marital status: Married    Spouse name: Not on file  . Number of children: Not on file  . Years of education: Not on file  . Highest education level: Not on file  Occupational History  . Not on file  Tobacco Use  . Smoking status: Never Smoker  . Smokeless tobacco: Never Used  Vaping Use  . Vaping Use: Never used  Substance and Sexual Activity  . Alcohol use: No  . Drug use: No  . Sexual activity: Yes    Partners: Male    Comment: TVH  Other Topics Concern  . Not on file  Social History Narrative  . Not on file   Social Determinants of Health   Financial Resource Strain:   . Difficulty of Paying Living Expenses:   Food Insecurity:   . Worried About Charity fundraiser in the Last Year:   . Arboriculturist in the Last Year:   Transportation Needs:   . Film/video editor (Medical):   Marland Kitchen Lack of Transportation (Non-Medical):   Physical Activity:   . Days of Exercise per Week:   . Minutes of Exercise per Session:   Stress:   . Feeling of Stress :   Social Connections:   . Frequency of Communication with Friends and Family:   . Frequency of Social Gatherings with Friends and Family:   . Attends Religious Services:   . Active Member of Clubs or Organizations:   . Attends Archivist Meetings:   Marland Kitchen Marital Status:   Intimate Partner Violence:   . Fear of Current or Ex-Partner:   . Emotionally Abused:   Marland Kitchen Physically Abused:   . Sexually Abused:     Review of Systems  All other systems reviewed and are negative.  PHYSICAL EXAMINATION:    BP 136/80 (BP Location: Right Arm, Patient Position: Sitting, Cuff Size: Large)   Pulse 70   Temp (!) 97.3 F (36.3 C) (Skin)   Resp 14   Ht 5' 4.75" (1.645 m)   Wt 235 lb 12.8 oz (107 kg)   LMP 05/26/1993 (Within Years)   BMI 39.54 kg/m     General appearance: alert, cooperative and appears stated age  Pelvic: External genitalia:  no lesions              Urethra:  normal  appearing urethra with no masses, tenderness or lesions              Bartholins and Skenes: normal                 Vagina: normal appearing vagina with normal color and discharge, no lesions.   Good anterior, apical and posterior vaginal support.              Cervix:   absent                Bimanual Exam:  Uterus:  Absent.                Adnexa: no mass, fullness, tenderness             Chaperone was present for exam.  ASSESSMENT  Status post enterocele and rectocele repair. Doing well post op. Atypical ductal hyperplasia of breast.  PLAN  Continue decreased activity (no lifting over 10 pounds, no exercise, and no sexual activity) for 6 weeks.  FU for final post op visit in 6 weeks.    An After Visit Summary was printed and given to the patient.

## 2019-12-14 ENCOUNTER — Other Ambulatory Visit: Payer: Self-pay

## 2019-12-14 ENCOUNTER — Ambulatory Visit (INDEPENDENT_AMBULATORY_CARE_PROVIDER_SITE_OTHER): Payer: BC Managed Care – PPO | Admitting: Obstetrics and Gynecology

## 2019-12-14 ENCOUNTER — Encounter: Payer: Self-pay | Admitting: Obstetrics and Gynecology

## 2019-12-14 VITALS — BP 148/82 | HR 80 | Ht 64.75 in | Wt 234.1 lb

## 2019-12-14 DIAGNOSIS — Z9889 Other specified postprocedural states: Secondary | ICD-10-CM

## 2019-12-14 NOTE — Progress Notes (Signed)
GYNECOLOGY  VISIT   HPI: 64 y.o.   Married  Caucasian  female   G2P2002 with Patient's last menstrual period was 05/26/1993 (within years).   here for 12 weeks status post POSTERIOR REPAIR (RECTOCELE)and Enterocele (N/A Vagina ).  Feeling good.  Doing more around the house but not lifting.  Is fatigued at times.   Occasionally has lazy bowels.  Uses Citrocel and sometimes stool softener.   No vaginal bleeding or spotting.     GYNECOLOGIC HISTORY: Patient's last menstrual period was 05/26/1993 (within years). Contraception:  Hyst Menopausal hormone therapy:  none Last mammogram: 07/04/19 - BI-RADS1, cat A density - [facility]  Last pap smear:  2009 Neg        OB History    Gravida  2   Para  2   Term  2   Preterm      AB      Living  2     SAB      TAB      Ectopic      Multiple      Live Births                 Patient Active Problem List   Diagnosis Date Noted  . Incarcerated incisional hernia s/p lap repair w mesh 10/02/2017 10/02/2017  . Right ovarian cyst 09/12/2017  . Heart murmur 01/01/2016  . Obesity (BMI 30-39.9) 06/27/2014  . OSA (obstructive sleep apnea) 03/24/2014  . Hyperlipidemia 01/20/2014  . DOE (dyspnea on exertion) 01/20/2014  . Chest pain 01/20/2014  . Acute medial meniscal tear 05/25/2013  . 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 Chronic back pain   . Arthritis, degenerative 09/26/2011  . Encounter for long-term (current) use of other medications 09/26/2011  . Rhinitis 07/05/2011  . HYPERLIPIDEMIA 12/21/2006  . Benign essential HTN 12/21/2006  . DIVERTICULOSIS, COLON 12/21/2006    Past Medical History:  Diagnosis Date  . Acute medial meniscal tear 05/25/2013  . Allergy   . Anxiety    regarding  upcoming breast surgery  . Arthritis, degenerative 09/26/2011  . Atypical ductal hyperplasia of left breast   . 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 Breast,  lumpectomy, Left - ATYPICAL DUCTAL HYPERPLASIA, SEE COMMENT. - SURGICAL MARGINS, NEGATIVE FOR ATYPIA OR MALIGNANCY. - PREVIOUS BIOPSY SITE IDENTIFIED. - ONE LYMPH NODE, NEGATIVE FOR TUMOR (0/1), 2. Breast, excision, Left medial margin - BENIGN BREAST TISSUE, SEE COMMENT. - NEGATIVE FOR ATYPIA OR MALIGNANCY.   Marland Kitchen Chest pain 01/20/2014  . DDD (degenerative disc disease)   . Diabetes mellitus without complication ([place])   . Diverticulosis of colon   . DIVERTICULOSIS, COLON 12/21/2006   Qualifier: Diagnosis of  By: Leanne Chang MD, Bruce    . DOE (dyspnea on exertion) 01/20/2014  . Encounter for long-term (current) use of other medications 09/26/2011  . Essential hypertension 12/21/2006     Pt states she has a white coat syndrome and her BP is OK at home.     . Family history of adverse reaction to anesthesia    Anesthesia said they woke her mother up too fast and developed A-Fib .  Marland Kitchen GERD (gastroesophageal reflux disease)   . Gout   . Hearing loss   . Heart murmur 01/01/2016  . Hyperlipidemia   . HYPERLIPIDEMIA 12/21/2006   Qualifier: Diagnosis of  By: Leanne Chang MD, Bruce    . Hypertension   . IBS (irritable bowel syndrome)   .  Incarcerated incisional hernia s/p lap repair w mesh 10/02/2017 10/02/2017  . Mild sleep apnea   . Obesity (BMI 30-39.9) 06/27/2014  . OSA (obstructive sleep apnea) 03/24/2014   Mild OSA with AHI 12.4/hr now on CPAP at 10cm H2O.  Persistent oxygen desats <88% for 11 minutes on CPAP and now on nighttime O2 at 2L via CPAP   . OSA on CPAP 06/27/2014  . PONV (postoperative nausea and vomiting)    Position carefully due to Ray-Cage back surgeries  . Right ovarian cyst 09/12/2017  . Sleep apnea    On CPAP  . Wears glasses     Past Surgical History:  Procedure Laterality Date  . ABDOMINAL HYSTERECTOMY  1994   TVH for DUB--ovaries retained    . ANTERIOR LUMBAR FUSION    . ANTERIOR LUMBAR FUSION  2018   [facility] Dr Jinny Sanders.  2 level ALIF at L4-5 and L5-S1  . BACK SURGERY  2011   lumb cyst  .  BACK SURGERY  04/18/2016   Anterior and posterior spinal fusion  . BREAST SURGERY    . CERVICAL LAMINECTOMY    . COLONOSCOPY  09/09/2012  . CYST REMOVAL TRUNK     LOWER BACK  . DILATION AND CURETTAGE OF UTERUS    . INSERTION OF MESH N/A 10/02/2017   Procedure: INSERTION OF MESH;  Surgeon: Michael Boston, MD;  Location: [place];  Service: General;  Laterality: N/A;  . JOINT REPLACEMENT     partial right knee  . KNEE ARTHROSCOPY    . KNEE ARTHROSCOPY Right 05/25/2013   Procedure: RIGHT ARTHROSCOPY KNEE, PARTIAL MEDIAL MENISCECTOMY, CHONDROPLASTY, PARTIAL LATERAL MENISCECTOMY;  Surgeon: Gearlean Alf, MD;  Location: [facility];  Service: Orthopedics;  Laterality: Right;  . LAPAROSCOPIC SALPINGO OOPHERECTOMY Bilateral 10/02/2017   Procedure: LAPAROSCOPIC SALPINGO OOPHORECTOMY, CYSTOSCOPY;  Surgeon: Nunzio Cobbs, MD;  Location: [facility];  Service: Gynecology;  Laterality: Bilateral;  . PARTIAL MASTECTOMY WITH NEEDLE LOCALIZATION Left 06/03/2013   Procedure: PARTIAL MASTECTOMY WITH NEEDLE LOCALIZATION;  Surgeon: [name], MD;  Location: [place];  Service: General;  Laterality: Left;  . POSTERIOR LUMBAR FUSION    . RECTOCELE REPAIR N/A 09/27/2019   Procedure: POSTERIOR REPAIR (RECTOCELE)and  Enterocele;  Surgeon: Nunzio Cobbs, MD;  Location[facility];  Service: Gynecology;  Laterality: N/A;  . SINUS IRRIGATION    . TONSILLECTOMY    . TRIGGER FINGER RELEASE    . VENTRAL HERNIA REPAIR N/A 10/02/2017   Procedure: LAPAROSCOPIC VENTRAL WALL ASSISTED  HERNIA REPAIR WITH MESH;  Surgeon: Michael Boston, MD;  Location: [place];  Service: General;  Laterality: N/A;  . WISDOM TOOTH EXTRACTION      Current Outpatient Medications  Medication Sig Dispense Refill  . allopurinol (ZYLOPRIM) 100 MG tablet Take 2 tablets by mouth daily.    . calcium-vitamin D (OSCAL WITH D) 500-200 MG-UNIT per  tablet Take 1 tablet by mouth daily with breakfast.     . diclofenac (VOLTAREN) 75 MG EC tablet Take 75 mg by mouth every evening.     . fexofenadine (ALLEGRA) 180 MG tablet Take 180 mg by mouth every evening.     . fish oil-omega-3 fatty acids 1000 MG capsule Take 1 g by mouth 2 (two) times daily.     . fluticasone (FLONASE) 50 MCG/ACT nasal spray as needed.    . hydrochlorothiazide (HYDRODIURIL) 25 MG tablet Take 25 mg by mouth daily    .  losartan (COZAAR) 50 MG tablet Take 25 mg by mouth daily.    . meclizine (ANTIVERT) 25 MG tablet Take 25 mg by mouth 3 (three) times daily as needed for dizziness.     . metFORMIN (GLUCOPHAGE) 500 MG tablet Take 500 mg by mouth every evening.     . methylcellulose (CITRUCEL) oral powder Take 1 packet by mouth as needed.     . mometasone (NASONEX) 50 MCG/ACT nasal spray Place 2 sprays into the nose at bedtime.     . nebivolol (BYSTOLIC) 10 MG tablet Take 10 mg by mouth every morning.    Marland Kitchen omeprazole (PRILOSEC) 20 MG capsule Take 20 mg by mouth daily as needed (for acid reflux).     . rosuvastatin (CRESTOR) 10 MG tablet Take 1 tablet by mouth 3 times a week. 15 tablet 9   No current facility-administered medications for this visit.     ALLERGIES: Demerol [meperidine], Penicillins, Sulfa antibiotics, Sulfasalazine, Triamterene, Amlodipine besylate, Arimidex [anastrozole], Lipitor [atorvastatin], Lisinopril, Propoxyphene, Simvastatin, and Topiramate  Family History  Problem Relation Age of Onset  . Heart disease Mother   . Deep vein thrombosis Mother   . Colon cancer Mother   . Rectal cancer Mother 61  . Lung disease Mother   . Colon polyps Mother   . Heart disease Father   . COPD Father   . Rheum arthritis Father   . Osteoarthritis Father   . Colon polyps Father   . Heart disease Brother   . Pulmonary embolism Brother   . Rheum arthritis Daughter   . Esophageal cancer Neg Hx   . Stomach cancer Neg Hx     Social History   Socioeconomic  History  . Marital status: Married    Spouse name: Not on file  . Number of children: Not on file  . Years of education: Not on file  . Highest education level: Not on file  Occupational History  . Not on file  Tobacco Use  . Smoking status: Never Smoker  . Smokeless tobacco: Never Used  Vaping Use  . Vaping Use: Never used  Substance and Sexual Activity  . Alcohol use: No  . Drug use: No  . Sexual activity: Yes    Partners: Male    Comment: TVH  Other Topics Concern  . Not on file  Social History Narrative  . Not on file   Social Determinants of Health   Financial Resource Strain:   . Difficulty of Paying Living Expenses:   Food Insecurity:   . Worried About Charity fundraiser in the Last Year:   . Arboriculturist in the Last Year:   Transportation Needs:   . Film/video editor (Medical):   Marland Kitchen Lack of Transportation (Non-Medical):   Physical Activity:   . Days of Exercise per Week:   . Minutes of Exercise per Session:   Stress:   . Feeling of Stress :   Social Connections:   . Frequency of Communication with Friends and Family:   . Frequency of Social Gatherings with Friends and Family:   . Attends Religious Services:   . Active Member of Clubs or Organizations:   . Attends Archivist Meetings:   Marland Kitchen Marital Status:   Intimate Partner Violence:   . Fear of Current or Ex-Partner:   . Emotionally Abused:   Marland Kitchen Physically Abused:   . Sexually Abused:     Review of Systems  Constitutional: Positive for fatigue.  All other  systems reviewed and are negative.   PHYSICAL EXAMINATION:    BP (!) 148/82 (BP Location: Right Arm, Patient Position: Sitting, Cuff Size: Large)   Pulse 80   Ht 5' 4.75" (1.645 m)   Wt 234 lb 1.6 oz (106.2 kg)   LMP 05/26/1993 (Within Years)   BMI 39.26 kg/m     General appearance: alert, cooperative and appears stated age  Pelvic: External genitalia:  no lesions              Urethra:  normal appearing urethra with no  masses, tenderness or lesions              Bartholins and Skenes: normal                 Vagina: normal appearing vagina with normal color and discharge, no lesions.  Good support.              Cervix: absent                Bimanual Exam:  Uterus:  absent              Adnexa: no mass, fullness, tenderness           Chaperone was present for exam.  ASSESSMENT  Doing well post op from enterocele/rectocele repair.  Hx atypical ductal hyperplasia of the breast.   PLAN  OK to resume normal activity.  Ok for Replens for vaginal hydration.  Not vaginal estrogen candidate.  Fu for annual exam and prn.

## 2020-01-09 DIAGNOSIS — R7303 Prediabetes: Secondary | ICD-10-CM | POA: Insufficient documentation

## 2020-01-09 DIAGNOSIS — K5900 Constipation, unspecified: Secondary | ICD-10-CM | POA: Insufficient documentation

## 2020-03-02 ENCOUNTER — Other Ambulatory Visit: Payer: Self-pay | Admitting: Student

## 2020-04-10 ENCOUNTER — Other Ambulatory Visit: Payer: Self-pay | Admitting: Cardiology

## 2020-05-09 ENCOUNTER — Other Ambulatory Visit: Payer: Self-pay | Admitting: Cardiology

## 2020-08-13 ENCOUNTER — Ambulatory Visit: Payer: BC Managed Care – PPO | Admitting: Obstetrics and Gynecology

## 2020-09-11 NOTE — Progress Notes (Signed)
65 y.o. G25P2002 Married Caucasian female here for annual exam.    Using Stool softener and Citrucel.  Doing well with rectal function following her rectocele/enterocele repair.  Some vaginal dryness.   Status post partial knee replacement.  Still dealing with some pain in her left lower extremity.  Walking 2.5 miles per day.   Received her Covid booster.  PCP:   Dr. Sherrie Sport  Patient's last menstrual period was 05/26/1993 (within years).           Sexually active: yes  The current method of family planning is status post hysterectomy.    Exercising: yes. Walking and stretches  Smoker:  no  Health Maintenance: Pap: 2009 Neg History of abnormal Pap:  Yes, Hx of cryotherapy to cervix MMG: 07-09-20 3D/Neg/Birads1--[facility] Colonoscopy: 01-13-19 polyp;next due 12/2023 BMD: 11-24-18  Result :normal TDaP:  06-25-16 Gardasil:   no QQV:ZDGLOVF blood in past Hep C:donated blood in past Screening Labs: PCP does blood work    reports that she has never smoked. She has never used smokeless tobacco. She reports that she does not drink alcohol and does not use drugs.  Past Medical History:  Diagnosis Date  . Acute medial meniscal tear 05/25/2013  . Allergy   . Anxiety    regarding  upcoming breast surgery  . Arthritis, degenerative 09/26/2011  . Atypical ductal hyperplasia of left breast   . 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 Breast, lumpectomy, Left - ATYPICAL DUCTAL HYPERPLASIA, SEE COMMENT. - SURGICAL MARGINS, NEGATIVE FOR ATYPIA OR MALIGNANCY. - PREVIOUS BIOPSY SITE IDENTIFIED. - ONE LYMPH NODE, NEGATIVE FOR TUMOR (0/1), 2. Breast, excision, Left medial margin - BENIGN BREAST TISSUE, SEE COMMENT. - NEGATIVE FOR ATYPIA OR MALIGNANCY.   Marland Kitchen Chest pain 01/20/2014  . DDD (degenerative disc disease)   . Diabetes mellitus without complication ([place])   . Diverticulosis of colon   . DIVERTICULOSIS, COLON 12/21/2006   Qualifier: Diagnosis of   By: Leanne Chang MD, Bruce    . DOE (dyspnea on exertion) 01/20/2014  . Encounter for long-term (current) use of other medications 09/26/2011  . Essential hypertension 12/21/2006     Pt states she has a white coat syndrome and her BP is OK at home.     . Family history of adverse reaction to anesthesia    Anesthesia said they woke her mother up too fast and developed A-Fib .  Marland Kitchen GERD (gastroesophageal reflux disease)   . Gout   . Hearing loss   . Heart murmur 01/01/2016  . Hyperlipidemia   . HYPERLIPIDEMIA 12/21/2006   Qualifier: Diagnosis of  By: Leanne Chang MD, Bruce    . Hypertension   . IBS (irritable bowel syndrome)   . Incarcerated incisional hernia s/p lap repair w mesh 10/02/2017 10/02/2017  . Mild sleep apnea   . Obesity (BMI 30-39.9) 06/27/2014  . OSA (obstructive sleep apnea) 03/24/2014   Mild OSA with AHI 12.4/hr now on CPAP at 10cm H2O.  Persistent oxygen desats <88% for 11 minutes on CPAP and now on nighttime O2 at 2L via CPAP   . OSA on CPAP 06/27/2014  . PONV (postoperative nausea and vomiting)    Position carefully due to Ray-Cage back surgeries  . Right ovarian cyst 09/12/2017  . Sleep apnea    On CPAP  . Wears glasses     Past Surgical History:  Procedure Laterality Date  . ABDOMINAL HYSTERECTOMY  1994   TVH for DUB--ovaries retained    .  ANTERIOR LUMBAR FUSION    . ANTERIOR LUMBAR FUSION  2018   [facility] Dr Jinny Sanders.  2 level ALIF at L4-5 and L5-S1  . BACK SURGERY  2011   lumb cyst  . BACK SURGERY  04/18/2016   Anterior and posterior spinal fusion  . BREAST SURGERY    . CERVICAL LAMINECTOMY    . COLONOSCOPY  09/09/2012  . CYST REMOVAL TRUNK     LOWER BACK  . DILATION AND CURETTAGE OF UTERUS    . INSERTION OF MESH N/A 10/02/2017   Procedure: INSERTION OF MESH;  Surgeon: Michael Boston, MD;  Location: [place];  Service: General;  Laterality: N/A;  . JOINT REPLACEMENT     partial right knee  . KNEE ARTHROSCOPY    . KNEE ARTHROSCOPY Right 05/25/2013   Procedure:  RIGHT ARTHROSCOPY KNEE, PARTIAL MEDIAL MENISCECTOMY, CHONDROPLASTY, PARTIAL LATERAL MENISCECTOMY;  Surgeon: Gearlean Alf, MD;  Location: [facility];  Service: Orthopedics;  Laterality: Right;  . KNEE SURGERY     left knee   . LAPAROSCOPIC SALPINGO OOPHERECTOMY Bilateral 10/02/2017   Procedure: LAPAROSCOPIC SALPINGO OOPHORECTOMY, CYSTOSCOPY;  Surgeon: Nunzio Cobbs, MD;  Location: [facility];  Service: Gynecology;  Laterality: Bilateral;  . PARTIAL MASTECTOMY WITH NEEDLE LOCALIZATION Left 06/03/2013   Procedure: PARTIAL MASTECTOMY WITH NEEDLE LOCALIZATION;  Surgeon: [name], MD;  Location: [place];  Service: General;  Laterality: Left;  . POSTERIOR LUMBAR FUSION    . RECTOCELE REPAIR N/A 09/27/2019   Procedure: POSTERIOR REPAIR (RECTOCELE)and  Enterocele;  Surgeon: Nunzio Cobbs, MD;  [facility];  Service: Gynecology;  Laterality: N/A;  . SINUS IRRIGATION    . TONSILLECTOMY    . TRIGGER FINGER RELEASE    . VENTRAL HERNIA REPAIR N/A 10/02/2017   Procedure: LAPAROSCOPIC VENTRAL WALL ASSISTED  HERNIA REPAIR WITH MESH;  Surgeon: Michael Boston, MD;  Location: [place];  Service: General;  Laterality: N/A;  . WISDOM TOOTH EXTRACTION      Current Outpatient Medications  Medication Sig Dispense Refill  . allopurinol (ZYLOPRIM) 100 MG tablet Take 2 tablets by mouth daily.    . calcium-vitamin D (OSCAL WITH D) 500-200 MG-UNIT per tablet Take 1 tablet by mouth daily with breakfast.     . diclofenac (VOLTAREN) 75 MG EC tablet Take 75 mg by mouth every evening.     . fexofenadine (ALLEGRA) 180 MG tablet Take 180 mg by mouth every evening.    . fish oil-omega-3 fatty acids 1000 MG capsule Take 1 g by mouth 2 (two) times daily.     . fluticasone (FLONASE) 50 MCG/ACT nasal spray as needed.    . hydrochlorothiazide (HYDRODIURIL) 25 MG tablet Take 25 mg by mouth daily    . losartan (COZAAR) 50 MG tablet  Take 25 mg by mouth daily.    . meclizine (ANTIVERT) 25 MG tablet Take 25 mg by mouth 3 (three) times daily as needed for dizziness.     . metFORMIN (GLUCOPHAGE) 500 MG tablet Take 500 mg by mouth every evening.     . methylcellulose oral powder Take 1 packet by mouth as needed.     . mometasone (NASONEX) 50 MCG/ACT nasal spray Place 2 sprays into the nose at bedtime.     . nebivolol (BYSTOLIC) 10 MG tablet Take 10 mg by mouth every morning.    Marland Kitchen omeprazole (PRILOSEC) 20 MG capsule Take 20 mg by mouth daily as  needed (for acid reflux).     . rosuvastatin (CRESTOR) 10 MG tablet TAKE (1) TABLET THREE TIMES A WEEK. Please make overdue appt with Dr. Meda Coffee before anymore refills. Thank you 3rd and Final Attempt 7 tablet 0   No current facility-administered medications for this visit.    Family History  Problem Relation Age of Onset  . Heart disease Mother   . Deep vein thrombosis Mother   . Colon cancer Mother   . Rectal cancer Mother 34  . Lung disease Mother   . Colon polyps Mother   . Heart disease Father   . COPD Father   . Rheum arthritis Father   . Osteoarthritis Father   . Colon polyps Father   . Heart disease Brother   . Pulmonary embolism Brother   . Rheum arthritis Daughter   . Esophageal cancer Neg Hx   . Stomach cancer Neg Hx     Review of Systems  All other systems reviewed and are negative.   Exam:   BP 130/84   Ht 5' 4.5" (1.638 m)   Wt 230 lb (104.3 kg)   LMP 05/26/1993 (Within Years)   BMI 38.87 kg/m     General appearance: alert, cooperative and appears stated age Head: normocephalic, without obvious abnormality, atraumatic Neck: no adenopathy, supple, symmetrical, trachea midline and thyroid normal to inspection and palpation Lungs: clear to auscultation bilaterally Breasts: normal appearance, no masses or tenderness, No nipple retraction or dimpling, No nipple discharge or bleeding, No axillary adenopathy Heart: regular rate and rhythm Abdomen: soft,  non-tender; no masses, no organomegaly Extremities: extremities normal, atraumatic, no cyanosis or edema Skin: skin color, texture, turgor normal. No rashes or lesions Lymph nodes: cervical, supraclavicular, and axillary nodes normal. Neurologic: grossly normal  Pelvic: External genitalia:  no lesions              No abnormal inguinal nodes palpated.              Urethra:  normal appearing urethra with no masses, tenderness or lesions              Bartholins and Skenes: normal                 Vagina: normal appearing vagina with normal color and discharge, no lesions              Cervix: absent              Pap taken: No. Bimanual Exam:  Uterus: absent              Adnexa: no mass, fullness, tenderness              Rectal exam: Yes.  .  Confirms.              Anus:  normal sphincter tone, no lesions  Chaperone was present for exam.  Assessment:   Well woman visit with normal exam. Status post TVHfor abnormal uterine bleeding, endometriosis, and fibroids.  Status post laparoscopic BSO, LOA. Benign serous cystadenofibroma.  Remote history of cryotherapy. Status post rectocele and enterocele repair.  History of atypical breast ductal hyperplasia. Status post partial left mastectomy.  Family history of rectal cancer. mother.  FH DVT/PE. Mother with DVT and hx malignancy. Brother with CAD at a young age.  Obesity. Prediabetes per patient.  On Metformin.  Sleep apnea.   Plan: Mammogram screening discussed. Self breast awareness reviewed. Pap and HR HPV as above. Guidelines for Calcium, Vitamin  D, regular exercise program including cardiovascular and weight bearing exercise.   Follow up annually and prn.

## 2020-09-12 ENCOUNTER — Ambulatory Visit (INDEPENDENT_AMBULATORY_CARE_PROVIDER_SITE_OTHER): Payer: BC Managed Care – PPO | Admitting: Obstetrics and Gynecology

## 2020-09-12 ENCOUNTER — Encounter: Payer: Self-pay | Admitting: Obstetrics and Gynecology

## 2020-09-12 ENCOUNTER — Other Ambulatory Visit: Payer: Self-pay

## 2020-09-12 VITALS — BP 130/84 | Ht 64.5 in | Wt 230.0 lb

## 2020-09-12 DIAGNOSIS — Z01419 Encounter for gynecological examination (general) (routine) without abnormal findings: Secondary | ICD-10-CM | POA: Diagnosis not present

## 2020-09-12 NOTE — Patient Instructions (Signed)

## 2020-10-09 ENCOUNTER — Ambulatory Visit (INDEPENDENT_AMBULATORY_CARE_PROVIDER_SITE_OTHER): Payer: BC Managed Care – PPO | Admitting: Cardiology

## 2020-10-09 ENCOUNTER — Encounter: Payer: Self-pay | Admitting: Cardiology

## 2020-10-09 ENCOUNTER — Other Ambulatory Visit: Payer: Self-pay

## 2020-10-09 VITALS — BP 142/82 | HR 72 | Ht 65.0 in | Wt 230.8 lb

## 2020-10-09 DIAGNOSIS — E669 Obesity, unspecified: Secondary | ICD-10-CM | POA: Diagnosis not present

## 2020-10-09 DIAGNOSIS — G4733 Obstructive sleep apnea (adult) (pediatric): Secondary | ICD-10-CM

## 2020-10-09 DIAGNOSIS — E7800 Pure hypercholesterolemia, unspecified: Secondary | ICD-10-CM

## 2020-10-09 DIAGNOSIS — I10 Essential (primary) hypertension: Secondary | ICD-10-CM | POA: Diagnosis not present

## 2020-10-09 LAB — BASIC METABOLIC PANEL
BUN/Creatinine Ratio: 20 (ref 12–28)
BUN: 15 mg/dL (ref 8–27)
CO2: 25 mmol/L (ref 20–29)
Calcium: 10.2 mg/dL (ref 8.7–10.3)
Chloride: 101 mmol/L (ref 96–106)
Creatinine, Ser: 0.75 mg/dL (ref 0.57–1.00)
Glucose: 92 mg/dL (ref 65–99)
Potassium: 4.7 mmol/L (ref 3.5–5.2)
Sodium: 141 mmol/L (ref 134–144)
eGFR: 89 mL/min/{1.73_m2} (ref >59)

## 2020-10-09 MED ORDER — NEBIVOLOL HCL 10 MG PO TABS: 10.0000 mg | ORAL_TABLET | Freq: Every morning | ORAL | 3 refills | Status: DC

## 2020-10-09 MED ORDER — HYDROCHLOROTHIAZIDE 25 MG PO TABS: ORAL_TABLET | ORAL | 3 refills | Status: DC

## 2020-10-09 MED ORDER — LOSARTAN POTASSIUM 50 MG PO TABS: 25.0000 mg | ORAL_TABLET | Freq: Every day | ORAL | 3 refills | Status: DC

## 2020-10-09 MED ORDER — ROSUVASTATIN CALCIUM 10 MG PO TABS: ORAL_TABLET | ORAL | 3 refills | Status: DC

## 2020-10-09 NOTE — Progress Notes (Signed)
Cardiology Office Note:    Date:  10/11/2020   ID:  COLTON ENGDAHL, DOB 12/10/1955, MRN [id number]  PCP:  Neale Burly, MD   [facility] Providers Cardiologist:  Ena Dawley, MD (Inactive) Sleep Medicine:  Fransico Him, MD {   Referring MD: Neale Burly, MD    History of Present Illness:    Ann Fernandez is a 64 y.o. female with a hx of mild OSA, HTN, HLD, and obesity who was previously followed by Dr. Meda Coffee who now returns to clinic for follow-up.  Last saw Dr. Meda Coffee in 2020 where she was struggling with weight loss. Otherwise was doing well from a CV standpoint. No medication changes made since that time.   Today, the patient overall feels well. Continues to have significant LE cramping with crestor. Has failed atorvastatin and simvastatin in the past. She is interested in an alternative agent. Otherwise, blood pressures running 130-140s at home. No chest pain, SOB, palpitations, lightheadedness, dizziness, or syncope. Patient is active but has some joint pain with activity but no chest pain or SOB. Walks 1.69miles/day without exertional symptoms. Has lost about 40JWJ on trulicity but continues to struggle with weight loss.   Past Medical History:  Diagnosis Date  . Acute medial meniscal tear 05/25/2013  . Allergy   . Anxiety    regarding  upcoming breast surgery  . Arthritis, degenerative 09/26/2011  . 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 Breast, lumpectomy, Left - ATYPICAL DUCTAL HYPERPLASIA, SEE COMMENT. - SURGICAL MARGINS, NEGATIVE FOR ATYPIA OR MALIGNANCY. - PREVIOUS BIOPSY SITE IDENTIFIED. - ONE LYMPH NODE, NEGATIVE FOR TUMOR (0/1), 2. Breast, excision, Left medial margin - BENIGN BREAST TISSUE, SEE COMMENT. - NEGATIVE FOR ATYPIA OR MALIGNANCY.   . DDD (degenerative disc disease)   . Diabetes mellitus without complication ([place])   . DIVERTICULOSIS, COLON 12/21/2006   Qualifier: Diagnosis of  By: Leanne Chang  MD, Bruce    . DOE (dyspnea on exertion) 01/20/2014  . Encounter for long-term (current) use of other medications 09/26/2011  . Essential hypertension 12/21/2006     Pt states she has a white coat syndrome and her BP is OK at home.     . Family history of adverse reaction to anesthesia    Anesthesia said they woke her mother up too fast and developed A-Fib .  Marland Kitchen GERD (gastroesophageal reflux disease)   . Gout   . Hearing loss   . HYPERLIPIDEMIA 12/21/2006   Qualifier: Diagnosis of  By: Leanne Chang MD, Bruce    . IBS (irritable bowel syndrome)   . Incarcerated incisional hernia s/p lap repair w mesh 10/02/2017 10/02/2017  . Obesity (BMI 30-39.9) 06/27/2014  . OSA (obstructive sleep apnea) 03/24/2014   Mild OSA with AHI 12.4/hr now on CPAP at 10cm H2O.  Persistent oxygen desats <88% for 11 minutes on CPAP and now on nighttime O2 at 2L via CPAP   . PONV (postoperative nausea and vomiting)    Position carefully due to [facility] back surgeries  . Right ovarian cyst 09/12/2017  . Wears glasses     Past Surgical History:  Procedure Laterality Date  . ABDOMINAL HYSTERECTOMY  1994   TVH for DUB--ovaries retained    . ANTERIOR LUMBAR FUSION    . ANTERIOR LUMBAR FUSION  2018   [facility] Dr Jinny Sanders.  2 level ALIF at L4-5 and L5-S1  . BACK SURGERY  2011   lumb cyst  . BACK SURGERY  04/18/2016  Anterior and posterior spinal fusion  . BREAST SURGERY    . CERVICAL LAMINECTOMY    . COLONOSCOPY  09/09/2012  . CYST REMOVAL TRUNK     LOWER BACK  . DILATION AND CURETTAGE OF UTERUS    . INSERTION OF MESH N/A 10/02/2017   Procedure: INSERTION OF MESH;  Surgeon: Michael Boston, MD;  Location: [place];  Service: General;  Laterality: N/A;  . JOINT REPLACEMENT     partial right knee  . KNEE ARTHROSCOPY    . KNEE ARTHROSCOPY Right 05/25/2013   Procedure: RIGHT ARTHROSCOPY KNEE, PARTIAL MEDIAL MENISCECTOMY, CHONDROPLASTY, PARTIAL LATERAL MENISCECTOMY;  Surgeon: Gearlean Alf, MD;  Location: [facility];   Service: Orthopedics;  Laterality: Right;  . KNEE SURGERY     left knee   . LAPAROSCOPIC SALPINGO OOPHERECTOMY Bilateral 10/02/2017   Procedure: LAPAROSCOPIC SALPINGO OOPHORECTOMY, CYSTOSCOPY;  Surgeon: Nunzio Cobbs, MD;  Location: [facility];  Service: Gynecology;  Laterality: Bilateral;  . PARTIAL MASTECTOMY WITH NEEDLE LOCALIZATION Left 06/03/2013   Procedure: PARTIAL MASTECTOMY WITH NEEDLE LOCALIZATION;  Surgeon: [name], MD;  Location: [place];  Service: General;  Laterality: Left;  . POSTERIOR LUMBAR FUSION    . RECTOCELE REPAIR N/A 09/27/2019   Procedure: POSTERIOR REPAIR (RECTOCELE)and  Enterocele;  Surgeon: Nunzio Cobbs, MD;  Location: [facility];  Service: Gynecology;  Laterality: N/A;  . SINUS IRRIGATION    . TONSILLECTOMY    . TRIGGER FINGER RELEASE    . VENTRAL HERNIA REPAIR N/A 10/02/2017   Procedure: LAPAROSCOPIC VENTRAL WALL ASSISTED  HERNIA REPAIR WITH MESH;  Surgeon: Michael Boston, MD;  Location: [place];  Service: General;  Laterality: N/A;  . WISDOM TOOTH EXTRACTION      Current Medications: Current Meds  Medication Sig  . allopurinol (ZYLOPRIM) 100 MG tablet Take 2 tablets by mouth daily.  . calcium-vitamin D (OSCAL WITH D) 500-200 MG-UNIT per tablet Take 1 tablet by mouth daily with breakfast.   . Dulaglutide (TRULICITY) 1.5 0000000 SOPN Inject 1.5 mg into the skin once a week.  . fexofenadine (ALLEGRA) 180 MG tablet Take 180 mg by mouth every evening.  . fish oil-omega-3 fatty acids 1000 MG capsule Take 1 g by mouth 2 (two) times daily.   . fluticasone (FLONASE) 50 MCG/ACT nasal spray as needed.  . hydrochlorothiazide (HYDRODIURIL) 25 MG tablet Take 25 mg by mouth daily  . losartan (COZAAR) 50 MG tablet Take 1 tablet (50 mg total) by mouth daily.  . meclizine (ANTIVERT) 25 MG tablet Take 25 mg by mouth 3 (three) times daily as needed for dizziness.   . meloxicam  (MOBIC) 15 MG tablet Take 15 mg by mouth daily.  . metFORMIN (GLUCOPHAGE) 500 MG tablet Take 500 mg by mouth every evening.   . methylcellulose oral powder Take 1 packet by mouth as needed.   . mometasone (NASONEX) 50 MCG/ACT nasal spray Place 2 sprays into the nose at bedtime.   . nebivolol (BYSTOLIC) 10 MG tablet Take 1 tablet (10 mg total) by mouth every morning.  Marland Kitchen omeprazole (PRILOSEC) 20 MG capsule Take 20 mg by mouth daily as needed (for acid reflux).   . rosuvastatin (CRESTOR) 10 MG tablet TAKE (1) TABLET THREE TIMES A WEEK  . [DISCONTINUED] losartan (COZAAR) 50 MG tablet Take 0.5 tablets (25 mg total) by mouth daily.     Allergies:   Demerol [meperidine], Penicillins, Sulfa antibiotics, Sulfasalazine, Triamterene, Amlodipine  besylate, Arimidex [anastrozole], Lipitor [atorvastatin], Lisinopril, Propoxyphene, Simvastatin, and Topiramate   Social History   Socioeconomic History  . Marital status: Married    Spouse name: Not on file  . Number of children: Not on file  . Years of education: Not on file  . Highest education level: Not on file  Occupational History  . Not on file  Tobacco Use  . Smoking status: Never Smoker  . Smokeless tobacco: Never Used  Vaping Use  . Vaping Use: Never used  Substance and Sexual Activity  . Alcohol use: No  . Drug use: No  . Sexual activity: Yes    Partners: Male    Comment: TVH  Other Topics Concern  . Not on file  Social History Narrative  . Not on file   Social Determinants of Health   Financial Resource Strain: Not on file  Food Insecurity: Not on file  Transportation Needs: Not on file  Physical Activity: Not on file  Stress: Not on file  Social Connections: Not on file     Family History: The patient's family history includes COPD in her father; Colon cancer in her mother; Colon polyps in her father and mother; Deep vein thrombosis in her mother; Heart disease in her brother, father, and mother; Lung disease in her mother;  Osteoarthritis in her father; Pulmonary embolism in her brother; Rectal cancer (age of onset: 60) in her mother; Rheum arthritis in her daughter and father. There is no history of Esophageal cancer or Stomach cancer.  ROS:   Please see the history of present illness.    Review of Systems  Constitutional: Negative for chills and fever.  HENT: Negative for hearing loss.   Eyes: Negative for blurred vision.  Respiratory: Negative for shortness of breath.   Cardiovascular: Negative for chest pain, palpitations, orthopnea, claudication, leg swelling and PND.  Gastrointestinal: Negative for melena, nausea and vomiting.  Genitourinary: Negative for dysuria.  Musculoskeletal: Positive for back pain and joint pain.  Neurological: Negative for dizziness and loss of consciousness.  Endo/Heme/Allergies: Negative for polydipsia.  Psychiatric/Behavioral: Negative for substance abuse.    EKGs/Labs/Other Studies Reviewed:    The following studies were reviewed today: TTE Jan 12, 2016: Study Conclusions  - Left ventricle: The cavity size was normal. Systolic function was  normal. The estimated ejection fraction was in the range of 55%  to 60%. Wall motion was normal; there were no regional wall  motion abnormalities. Features are consistent with a pseudonormal  left ventricular filling pattern, with concomitant abnormal  relaxation and increased filling pressure (grade 2 diastolic  dysfunction). Doppler parameters are consistent with high  ventricular filling pressure.  - Aortic valve: Mildly calcified annulus. Trileaflet; normal  thickness leaflets.  - Mitral valve: Calcified annulus. There was trivial regurgitation.  EKG:  EKG is  ordered today.  The ekg ordered today demonstrates NSR with HR 69  Recent Labs: 10/09/2020: BUN 15; Creatinine, Ser 0.75; Potassium 4.7; Sodium 141  Recent Lipid Panel    Component Value Date/Time   CHOL 158 01/25/2019 0900   TRIG 204 (H) 01/25/2019  0900   HDL 36 (L) 01/25/2019 0900   CHOLHDL 4.4 01/25/2019 0900   CHOLHDL 4.4 10/12/2015 1114   VLDL 44 (H) 10/12/2015 1114   LDLCALC 87 01/25/2019 0900   LDLDIRECT 172.8 06/29/2012 1003      Physical Exam:    VS:  BP 138/88   Pulse 69   Ht 5\' 5"  (1.651 m)   Wt 230 lb (104.3 kg)  LMP 05/26/1993 (Within Years)   SpO2 98%   BMI 38.27 kg/m     Wt Readings from Last 3 Encounters:  10/11/20 230 lb (104.3 kg)  10/09/20 230 lb 12.8 oz (104.7 kg)  09/12/20 230 lb (104.3 kg)     GEN:  Well nourished, well developed in no acute distress HEENT: Normal NECK: No JVD; No carotid bruits CARDIAC: RRR, no murmurs, rubs, gallops RESPIRATORY:  Clear to auscultation without rales, wheezing or rhonchi  ABDOMEN: Soft, non-tender, non-distended MUSCULOSKELETAL:  No edema; No deformity  SKIN: Warm and dry NEUROLOGIC:  Alert and oriented x 3 PSYCHIATRIC:  Normal affect   ASSESSMENT:    1. Obesity (BMI 30-39.9)   2. Statin intolerance   3. Pure hypercholesterolemia   4. Other hyperlipidemia   5. Elevated LFTs   6. Benign essential HTN   7. Medication management   8. OSA (obstructive sleep apnea)    PLAN:    In order of problems listed above:  #Obesity: BMI 38. Working hard on weight loss right now. Has adhered to healthy diet and is increasing exercise. Started on trulicity.  -Continue diet and lifestyle modifications -Will look into ozempic and wegovy -Discussed intermittent fasting and mediterranean diet -Can consider weight loss clinic in the future  #OSA: Followed by Dr. Radford Pax. -Continue CPAP  #HTN: Running 130-140s at home. -Continue HCTZ 25mg  daily -Continue bystolic 10mg  daily -Increase losartan to 50mg  daily -BMET next week  #HLD: #Statin Intolerance: Goal <100. LDL 81 in 12/2019. Having significant cramping with crestor and has failed lipitor and simva in the past. Likely a good candidate for PCSK9i. -Refer to lipid clinic -Continue crestor 10mg  3x/week for  now -Repeat lipids, LFTs next week while fasting   Medication Adjustments/Labs and Tests Ordered: Current medicines are reviewed at length with the patient today.  Concerns regarding medicines are outlined above.  Orders Placed This Encounter  Procedures  . Lipid Profile  . Basic metabolic panel  . Hepatic function panel  . AMB Referral to [facility] Pharm-D  . EKG 12-Lead   Meds ordered this encounter  Medications  . losartan (COZAAR) 50 MG tablet    Sig: Take 1 tablet (50 mg total) by mouth daily.    Dispense:  90 tablet    Refill:  2    Dose increase    Patient Instructions  Medication Instructions:   INCREASE YOUR LOSARTAN TO 50 MG BY MOUTH DAILY  *If you need a refill on your cardiac medications before your next appointment, please call your pharmacy*   Lab Work:  IN ONE WEEK---WILL CHECK BMET, LFTs, AND LIPIDS--PLEASE COME FASTING TO THIS LAB APPOINTMENT  If you have labs (blood work) drawn today and your tests are completely normal, you will receive your results only by: Marland Kitchen MyChart Message (if you have MyChart) OR . A paper copy in the mail If you have any lab test that is abnormal or we need to change your treatment, we will call you to review the results.   You have been referred to [place] OFFICE TO SEE OUR PHARMACIST FOR FURTHER MANAGEMENT OF YOUR CHOLESTEROL AND STATIN INTOLERANCE   Follow-Up: At [facility], you and your health needs are our priority.  As part of our continuing mission to provide you with exceptional heart care, we have created designated Provider Care Teams.  These Care Teams include your primary Cardiologist (physician) and Advanced Practice Providers (APPs -  Physician Assistants and Nurse Practitioners) who all  work together to provide you with the care you need, when you need it.  We recommend signing up for the patient portal called "MyChart".  Sign up information is provided on this After Visit Summary.  MyChart  is used to connect with patients for Virtual Visits (Telemedicine).  Patients are able to view lab/test results, encounter notes, upcoming appointments, etc.  Non-urgent messages can be sent to your provider as well.   To learn more about what you can do with MyChart, go to [email or web address].    Your next appointment:   1 year(s)  The format for your next appointment:   In Person  Provider:   Gwyndolyn Kaufman, MD        Signed, Freada Bergeron, MD  10/11/2020 10:44 AM    [place]

## 2020-10-09 NOTE — Patient Instructions (Signed)
Medication Instructions:  Your physician recommends that you continue on your current medications as directed. Please refer to the Current Medication list given to you today.  *If you need a refill on your cardiac medications before your next appointment, please call your pharmacy*   Lab Work: TODAY: BMET IN [place]: Fasting lipids and ALT If you have labs (blood work) drawn today and your tests are completely normal, you will receive your results only by: Marland Kitchen MyChart Message (if you have MyChart) OR . A paper copy in the mail If you have any lab test that is abnormal or we need to change your treatment, we will call you to review the results.  Follow-Up: At [facility], you and your health needs are our priority.  As part of our continuing mission to provide you with exceptional heart care, we have created designated Provider Care Teams.  These Care Teams include your primary Cardiologist (physician) and Advanced Practice Providers (APPs -  Physician Assistants and Nurse Practitioners) who all work together to provide you with the care you need, when you need it.    Your next appointment:   1 year(s)  The format for your next appointment:   In Person  Provider:

## 2020-10-09 NOTE — Progress Notes (Signed)
Cardiology Office Note:    Date:  10/09/2020   ID:  Ann Fernandez, DOB 1956-03-19, MRN [id number]  PCP:  Neale Burly, MD  Cardiologist:  Fransico Him, MD   Referring MD: Neale Burly, MD   Reason for visit: OSA and HTN  History of Present Illness:    Ann Fernandez is a 65 y.o. female with a hx of mild OSA , hypertension, hyperlipidemia and obesity who is on CPAP.    She is here today for followup of her OSA, HTN, HLD and obesity and is doing well.  She denies any chest pain or pressure, SOB, DOE, PND, orthopnea, LE edema, dizziness, palpitations or syncope. She is compliant with her meds and is tolerating meds with no SE.    She is doing well with her CPAP device and thinks that she has gotten used to it.  She tolerates the mask and feels the pressure is adequate.  Since going on CPAP she feels rested in the am and has no significant daytime sleepiness.  She denies any significant mouth or nasal dryness or nasal congestion.  She does not think that he snores.    Past Medical History:  Diagnosis Date  . Acute medial meniscal tear 05/25/2013  . Allergy   . Anxiety    regarding  upcoming breast surgery  . Arthritis, degenerative 09/26/2011  . 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 Breast, lumpectomy, Left - ATYPICAL DUCTAL HYPERPLASIA, SEE COMMENT. - SURGICAL MARGINS, NEGATIVE FOR ATYPIA OR MALIGNANCY. - PREVIOUS BIOPSY SITE IDENTIFIED. - ONE LYMPH NODE, NEGATIVE FOR TUMOR (0/1), 2. Breast, excision, Left medial margin - BENIGN BREAST TISSUE, SEE COMMENT. - NEGATIVE FOR ATYPIA OR MALIGNANCY.   . DDD (degenerative disc disease)   . Diabetes mellitus without complication ([place])   . DIVERTICULOSIS, COLON 12/21/2006   Qualifier: Diagnosis of  By: Leanne Chang MD, Bruce    . DOE (dyspnea on exertion) 01/20/2014  . Encounter for long-term (current) use of other medications 09/26/2011  . Essential hypertension 12/21/2006     Pt states she  has a white coat syndrome and her BP is OK at home.     . Family history of adverse reaction to anesthesia    Anesthesia said they woke her mother up too fast and developed A-Fib .  Marland Kitchen GERD (gastroesophageal reflux disease)   . Gout   . Hearing loss   . HYPERLIPIDEMIA 12/21/2006   Qualifier: Diagnosis of  By: Leanne Chang MD, Bruce    . IBS (irritable bowel syndrome)   . Incarcerated incisional hernia s/p lap repair w mesh 10/02/2017 10/02/2017  . Obesity (BMI 30-39.9) 06/27/2014  . OSA (obstructive sleep apnea) 03/24/2014   Mild OSA with AHI 12.4/hr now on CPAP at 10cm H2O.  Persistent oxygen desats <88% for 11 minutes on CPAP and now on nighttime O2 at 2L via CPAP   . PONV (postoperative nausea and vomiting)    Position carefully due to Ray-Cage back surgeries  . Right ovarian cyst 09/12/2017  . Wears glasses     Past Surgical History:  Procedure Laterality Date  . ABDOMINAL HYSTERECTOMY  1994   TVH for DUB--ovaries retained    . ANTERIOR LUMBAR FUSION    . ANTERIOR LUMBAR FUSION  2018   [facility] Dr Jinny Sanders.  2 level ALIF at L4-5 and L5-S1  . BACK SURGERY  2011   lumb cyst  . BACK SURGERY  04/18/2016   Anterior and posterior  spinal fusion  . BREAST SURGERY    . CERVICAL LAMINECTOMY    . COLONOSCOPY  09/09/2012  . CYST REMOVAL TRUNK     LOWER BACK  . DILATION AND CURETTAGE OF UTERUS    . INSERTION OF MESH N/A 10/02/2017   Procedure: INSERTION OF MESH;  Surgeon: Michael Boston, MD;  Location: [place];  Service: General;  Laterality: N/A;  . JOINT REPLACEMENT     partial right knee  . KNEE ARTHROSCOPY    . KNEE ARTHROSCOPY Right 05/25/2013   Procedure: RIGHT ARTHROSCOPY KNEE, PARTIAL MEDIAL MENISCECTOMY, CHONDROPLASTY, PARTIAL LATERAL MENISCECTOMY;  Surgeon: Gearlean Alf, MD;  Location: [facility];  Service: Orthopedics;  Laterality: Right;  . KNEE SURGERY     left knee   . LAPAROSCOPIC SALPINGO OOPHERECTOMY Bilateral 10/02/2017   Procedure: LAPAROSCOPIC SALPINGO  OOPHORECTOMY, CYSTOSCOPY;  Surgeon: Nunzio Cobbs, MD;  Location: [facility];  Service: Gynecology;  Laterality: Bilateral;  . PARTIAL MASTECTOMY WITH NEEDLE LOCALIZATION Left 06/03/2013   Procedure: PARTIAL MASTECTOMY WITH NEEDLE LOCALIZATION;  Surgeon: [name], MD;  Location: [place];  Service: General;  Laterality: Left;  . POSTERIOR LUMBAR FUSION    . RECTOCELE REPAIR N/A 09/27/2019   Procedure: POSTERIOR REPAIR (RECTOCELE)and  Enterocele;  Surgeon: Nunzio Cobbs, MD;  Location: [facility];  Service: Gynecology;  Laterality: N/A;  . SINUS IRRIGATION    . TONSILLECTOMY    . TRIGGER FINGER RELEASE    . VENTRAL HERNIA REPAIR N/A 10/02/2017   Procedure: LAPAROSCOPIC VENTRAL WALL ASSISTED  HERNIA REPAIR WITH MESH;  Surgeon: Michael Boston, MD;  Location: [place];  Service: General;  Laterality: N/A;  . WISDOM TOOTH EXTRACTION      Current Medications: Current Meds  Medication Sig  . allopurinol (ZYLOPRIM) 100 MG tablet Take 2 tablets by mouth daily.  . calcium-vitamin D (OSCAL WITH D) 500-200 MG-UNIT per tablet Take 1 tablet by mouth daily with breakfast.   . fexofenadine (ALLEGRA) 180 MG tablet Take 180 mg by mouth every evening.  . fish oil-omega-3 fatty acids 1000 MG capsule Take 1 g by mouth 2 (two) times daily.   . fluticasone (FLONASE) 50 MCG/ACT nasal spray as needed.  . hydrochlorothiazide (HYDRODIURIL) 25 MG tablet Take 25 mg by mouth daily  . losartan (COZAAR) 50 MG tablet Take 25 mg by mouth daily.  . meclizine (ANTIVERT) 25 MG tablet Take 25 mg by mouth 3 (three) times daily as needed for dizziness.   . metFORMIN (GLUCOPHAGE) 500 MG tablet Take 500 mg by mouth every evening.   . methylcellulose oral powder Take 1 packet by mouth as needed.   . mometasone (NASONEX) 50 MCG/ACT nasal spray Place 2 sprays into the nose at bedtime.   . nebivolol (BYSTOLIC) 10 MG tablet Take 10 mg by mouth  every morning.  Marland Kitchen omeprazole (PRILOSEC) 20 MG capsule Take 20 mg by mouth daily as needed (for acid reflux).   . rosuvastatin (CRESTOR) 10 MG tablet TAKE (1) TABLET THREE TIMES A WEEK. Please make overdue appt with Dr. Meda Coffee before anymore refills. Thank you 3rd and Final Attempt     Allergies:   Demerol [meperidine], Penicillins, Sulfa antibiotics, Sulfasalazine, Triamterene, Amlodipine besylate, Arimidex [anastrozole], Lipitor [atorvastatin], Lisinopril, Propoxyphene, Simvastatin, and Topiramate   Social History   Socioeconomic History  . Marital status: Married    Spouse name: Not on file  . Number of children: Not on file  .  Years of education: Not on file  . Highest education level: Not on file  Occupational History  . Not on file  Tobacco Use  . Smoking status: Never Smoker  . Smokeless tobacco: Never Used  Vaping Use  . Vaping Use: Never used  Substance and Sexual Activity  . Alcohol use: No  . Drug use: No  . Sexual activity: Yes    Partners: Male    Comment: TVH  Other Topics Concern  . Not on file  Social History Narrative  . Not on file   Social Determinants of Health   Financial Resource Strain: Not on file  Food Insecurity: Not on file  Transportation Needs: Not on file  Physical Activity: Not on file  Stress: Not on file  Social Connections: Not on file     Family History: The patient's family history includes COPD in her father; Colon cancer in her mother; Colon polyps in her father and mother; Deep vein thrombosis in her mother; Heart disease in her brother, father, and mother; Lung disease in her mother; Osteoarthritis in her father; Pulmonary embolism in her brother; Rectal cancer (age of onset: 3) in her mother; Rheum arthritis in her daughter and father. There is no history of Esophageal cancer or Stomach cancer.  ROS:   Please see the history of present illness.    ROS  All other systems reviewed and negative.   EKGs/Labs/Other Studies  Reviewed:    The following studies were reviewed today: PAP download   EKG:  EKG is not ordered today.    Recent Labs: No results found for requested labs within last 8760 hours.   Recent Lipid Panel    Component Value Date/Time   CHOL 158 01/25/2019 0900   TRIG 204 (H) 01/25/2019 0900   HDL 36 (L) 01/25/2019 0900   CHOLHDL 4.4 01/25/2019 0900   CHOLHDL 4.4 10/12/2015 1114   VLDL 44 (H) 10/12/2015 1114   LDLCALC 87 01/25/2019 0900   LDLDIRECT 172.8 06/29/2012 1003    Physical Exam:    VS:  BP (!) 142/82   Pulse 72   Ht 5\' 5"  (1.651 m)   Wt 230 lb 12.8 oz (104.7 kg)   LMP 05/26/1993 (Within Years)   SpO2 97%   BMI 38.41 kg/m     Wt Readings from Last 3 Encounters:  10/09/20 230 lb 12.8 oz (104.7 kg)  09/12/20 230 lb (104.3 kg)  12/14/19 234 lb 1.6 oz (106.2 kg)     GEN: Well nourished, well developed in no acute distress HEENT: Normal NECK: No JVD; No carotid bruits LYMPHATICS: No lymphadenopathy CARDIAC:RRR, no murmurs, rubs, gallops RESPIRATORY:  Clear to auscultation without rales, wheezing or rhonchi  ABDOMEN: Soft, non-tender, non-distended MUSCULOSKELETAL:  No edema; No deformity  SKIN: Warm and dry NEUROLOGIC:  Alert and oriented x 3 PSYCHIATRIC:  Normal affect    ASSESSMENT:    1. OSA (obstructive sleep apnea)   2. Benign essential HTN   3. Obesity (BMI 30-39.9)   4. Pure hypercholesterolemia      PLAN:    In order of problems listed above:  OSA - The patient is tolerating PAP therapy well without any problems. The PAP download was reviewed today and showed an AHI of 1/hr on 8 cm H2O with 100% compliance in using more than 4 hours nightly.  The patient has been using and benefiting from PAP use and will continue to benefit from therapy.   HTN -Her BP is borderline controlled on exam  today -She says at home BP is 138/77mmHg -she will continue prescription drug management with HCTZ 25mg  daily, Losartan 25mg  daily and Bystolic 10mg   daily>>refills have been done for 1 year -check BMET today  Obesity -I have encouraged her to try to cut back on carbs and portions and continue to walk  HLD -LDL goal < 100 -her LDL was 81 in Aug 2021 -continue Crestor 10mg  3 times weekly -repeat FLP and ALT in Aug 2022  Follow up in 1 year.  Medication Adjustments/Labs and Tests Ordered: Current medicines are reviewed at length with the patient today.  Concerns regarding medicines are outlined above.  No orders of the defined types were placed in this encounter.  No orders of the defined types were placed in this encounter.  Signed, Fransico Him, MD  10/09/2020 9:16 AM    [place]

## 2020-10-09 NOTE — Addendum Note (Signed)
Addended by: Antonieta Iba on: 10/09/2020 09:25 AM   Modules accepted: Orders

## 2020-10-11 ENCOUNTER — Ambulatory Visit (INDEPENDENT_AMBULATORY_CARE_PROVIDER_SITE_OTHER): Payer: 59 | Admitting: Cardiology

## 2020-10-11 ENCOUNTER — Encounter: Payer: Self-pay | Admitting: Cardiology

## 2020-10-11 ENCOUNTER — Ambulatory Visit: Payer: BC Managed Care – PPO | Admitting: Cardiology

## 2020-10-11 ENCOUNTER — Other Ambulatory Visit: Payer: Self-pay

## 2020-10-11 VITALS — BP 138/88 | HR 69 | Ht 65.0 in | Wt 230.0 lb

## 2020-10-11 DIAGNOSIS — E669 Obesity, unspecified: Secondary | ICD-10-CM

## 2020-10-11 DIAGNOSIS — E7849 Other hyperlipidemia: Secondary | ICD-10-CM | POA: Diagnosis not present

## 2020-10-11 DIAGNOSIS — E7800 Pure hypercholesterolemia, unspecified: Secondary | ICD-10-CM

## 2020-10-11 DIAGNOSIS — G4733 Obstructive sleep apnea (adult) (pediatric): Secondary | ICD-10-CM

## 2020-10-11 DIAGNOSIS — Z789 Other specified health status: Secondary | ICD-10-CM | POA: Diagnosis not present

## 2020-10-11 DIAGNOSIS — I10 Essential (primary) hypertension: Secondary | ICD-10-CM

## 2020-10-11 DIAGNOSIS — Z79899 Other long term (current) drug therapy: Secondary | ICD-10-CM

## 2020-10-11 DIAGNOSIS — R7989 Other specified abnormal findings of blood chemistry: Secondary | ICD-10-CM | POA: Diagnosis not present

## 2020-10-11 MED ORDER — LOSARTAN POTASSIUM 50 MG PO TABS
50.0000 mg | ORAL_TABLET | Freq: Every day | ORAL | 2 refills | Status: DC
Start: 2020-10-11 — End: 2021-09-18

## 2020-10-11 NOTE — Patient Instructions (Signed)
Medication Instructions:   INCREASE YOUR LOSARTAN TO 50 MG BY MOUTH DAILY  *If you need a refill on your cardiac medications before your next appointment, please call your pharmacy*   Lab Work:  IN ONE WEEK---WILL CHECK BMET, LFTs, AND LIPIDS--PLEASE COME FASTING TO THIS LAB APPOINTMENT  If you have labs (blood work) drawn today and your tests are completely normal, you will receive your results only by: Marland Kitchen MyChart Message (if you have MyChart) OR . A paper copy in the mail If you have any lab test that is abnormal or we need to change your treatment, we will call you to review the results.   You have been referred to [place] OFFICE TO SEE OUR PHARMACIST FOR FURTHER MANAGEMENT OF YOUR CHOLESTEROL AND STATIN INTOLERANCE   Follow-Up: At [facility], you and your health needs are our priority.  As part of our continuing mission to provide you with exceptional heart care, we have created designated Provider Care Teams.  These Care Teams include your primary Cardiologist (physician) and Advanced Practice Providers (APPs -  Physician Assistants and Nurse Practitioners) who all work together to provide you with the care you need, when you need it.  We recommend signing up for the patient portal called "MyChart".  Sign up information is provided on this After Visit Summary.  MyChart is used to connect with patients for Virtual Visits (Telemedicine).  Patients are able to view lab/test results, encounter notes, upcoming appointments, etc.  Non-urgent messages can be sent to your provider as well.   To learn more about what you can do with MyChart, go to [email or web address].    Your next appointment:   1 year(s)  The format for your next appointment:   In Person  Provider:   Gwyndolyn Kaufman, MD

## 2020-10-15 ENCOUNTER — Telehealth: Payer: Self-pay | Admitting: Pharmacist

## 2020-10-15 NOTE — Telephone Encounter (Signed)
Per Dr Johney Frame, patient interested in GLP for weight loss.  Called patient and left message on machine.  Note: as I was leaving message, saw that patient has Trulicity on med list.

## 2020-10-15 NOTE — Telephone Encounter (Signed)
Patient called back.  Is interested in d/c Trulicity and starting Ozempic and titrating up.  Scheduledquick appt on Thursday morning for trianing since patient is coming for lab work

## 2020-10-18 ENCOUNTER — Ambulatory Visit (INDEPENDENT_AMBULATORY_CARE_PROVIDER_SITE_OTHER): Payer: 59 | Admitting: Pharmacist

## 2020-10-18 ENCOUNTER — Other Ambulatory Visit: Payer: Self-pay

## 2020-10-18 ENCOUNTER — Encounter: Payer: Self-pay | Admitting: Pharmacist

## 2020-10-18 ENCOUNTER — Other Ambulatory Visit: Payer: BC Managed Care – PPO

## 2020-10-18 VITALS — BP 158/90 | HR 63 | Wt 231.0 lb

## 2020-10-18 DIAGNOSIS — I10 Essential (primary) hypertension: Secondary | ICD-10-CM

## 2020-10-18 DIAGNOSIS — R7989 Other specified abnormal findings of blood chemistry: Secondary | ICD-10-CM

## 2020-10-18 DIAGNOSIS — R7302 Impaired glucose tolerance (oral): Secondary | ICD-10-CM | POA: Diagnosis not present

## 2020-10-18 DIAGNOSIS — Z789 Other specified health status: Secondary | ICD-10-CM

## 2020-10-18 DIAGNOSIS — E7800 Pure hypercholesterolemia, unspecified: Secondary | ICD-10-CM

## 2020-10-18 DIAGNOSIS — E7849 Other hyperlipidemia: Secondary | ICD-10-CM

## 2020-10-18 DIAGNOSIS — Z79899 Other long term (current) drug therapy: Secondary | ICD-10-CM

## 2020-10-18 LAB — LIPID PANEL
Chol/HDL Ratio: 4.3 ratio (ref 0.0–4.4)
Cholesterol, Total: 162 mg/dL (ref 100–199)
HDL: 38 mg/dL — ABNORMAL LOW (ref >39)
LDL Chol Calc (NIH): 87 mg/dL (ref 0–99)
Triglycerides: 221 mg/dL — ABNORMAL HIGH (ref 0–149)
VLDL Cholesterol Cal: 37 mg/dL (ref 5–40)

## 2020-10-18 LAB — HEPATIC FUNCTION PANEL
ALT: 43 IU/L — ABNORMAL HIGH (ref 0–32)
AST: 38 IU/L (ref 0–40)
Albumin: 4.2 g/dL (ref 3.8–4.8)
Alkaline Phosphatase: 60 IU/L (ref 44–121)
Bilirubin Total: 0.5 mg/dL (ref 0.0–1.2)
Bilirubin, Direct: 0.14 mg/dL (ref 0.00–0.40)
Total Protein: 6 g/dL (ref 6.0–8.5)

## 2020-10-18 LAB — BASIC METABOLIC PANEL
BUN/Creatinine Ratio: 19 (ref 12–28)
BUN: 14 mg/dL (ref 8–27)
CO2: 26 mmol/L (ref 20–29)
Calcium: 9.8 mg/dL (ref 8.7–10.3)
Chloride: 100 mmol/L (ref 96–106)
Creatinine, Ser: 0.74 mg/dL (ref 0.57–1.00)
Glucose: 103 mg/dL — ABNORMAL HIGH (ref 65–99)
Potassium: 4.3 mmol/L (ref 3.5–5.2)
Sodium: 140 mmol/L (ref 134–144)
eGFR: 90 mL/min/{1.73_m2} (ref >59)

## 2020-10-18 MED ORDER — OZEMPIC (0.25 OR 0.5 MG/DOSE) 2 MG/1.5ML ~~LOC~~ SOPN: 0.5000 mg | PEN_INJECTOR | SUBCUTANEOUS | 0 refills | Status: DC

## 2020-10-18 NOTE — Progress Notes (Signed)
Patient ID: Ann Fernandez                 DOB: [birth year]                    MRN: [id number]     HPI: Ann Fernandez is a 65 y.o. female patient referred to pharmacy clinic by Dr Johney Frame to initiate weight loss therapy with GLP1-RA. PMH is significant for obesity complicated by chronic medical conditions including HTN, Pre DM, and HLD . Most recent BMI 38.44.   Current weight management medications: Trulicity  Previously tried meds: Trulicity  Current meds that may affect weight: Trulicity  Baseline weight/BMI: 231.0#  38.44kg/m2  Insurance payor: [employer]   Diet:  -Breakfast: 2 scrambled eggs, wheat toasy -Lunch: salad, 1/2 sandwich, small cookie, apple, banana, berries -Dinner: grilled chicken, grilled fish, baked potato, vegetables, salad -Snacks: grapes -Drinks: homemade sweet tea, water, sugar free soft drink  Labs: A1c 6.2  On 10/29/19.  LDL 81, HDL 41, Trigs 194, TC 155 on rosuvastatin 74m (12/29/19) Lab Results  Component Value Date   HGBA1C 6.0 (H) 09/24/2017    Wt Readings from Last 1 Encounters:  10/18/20 231 lb (104.8 kg)    BP Readings from Last 1 Encounters:  10/11/20 138/88   Pulse Readings from Last 1 Encounters:  10/11/20 69       Component Value Date/Time   CHOL 158 01/25/2019 0900   TRIG 204 (H) 01/25/2019 0900   HDL 36 (L) 01/25/2019 0900   CHOLHDL 4.4 01/25/2019 0900   CHOLHDL 4.4 10/12/2015 1114   VLDL 44 (H) 10/12/2015 1114   LDLCALC 87 01/25/2019 0900   LDLDIRECT 172.8 06/29/2012 1003    Past Medical History:  Diagnosis Date  . Acute medial meniscal tear 05/25/2013  . Allergy   . Anxiety    regarding  upcoming breast surgery  . Arthritis, degenerative 09/26/2011  . 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 Breast, lumpectomy, Left - ATYPICAL DUCTAL HYPERPLASIA, SEE COMMENT. - SURGICAL MARGINS, NEGATIVE FOR ATYPIA OR MALIGNANCY. - PREVIOUS BIOPSY SITE IDENTIFIED. - ONE LYMPH NODE,  NEGATIVE FOR TUMOR (0/1), 2. Breast, excision, Left medial margin - BENIGN BREAST TISSUE, SEE COMMENT. - NEGATIVE FOR ATYPIA OR MALIGNANCY.   . DDD (degenerative disc disease)   . Diabetes mellitus without complication ([place]   . DIVERTICULOSIS, COLON 12/21/2006   Qualifier: Diagnosis of  By: SLeanne ChangMD, Ann Fernandez    . DOE (dyspnea on exertion) 01/20/2014  . Encounter for long-term (current) use of other medications 09/26/2011  . Essential hypertension 12/21/2006     Pt states she has a white coat syndrome and her BP is OK at home.     . Family history of adverse reaction to anesthesia    Anesthesia said they woke her mother up too fast and developed A-Fib .  .Marland KitchenGERD (gastroesophageal reflux disease)   . Gout   . Hearing loss   . HYPERLIPIDEMIA 12/21/2006   Qualifier: Diagnosis of  By: SLeanne ChangMD, Ann Fernandez    . IBS (irritable bowel syndrome)   . Incarcerated incisional hernia s/p lap repair w mesh 10/02/2017 10/02/2017  . Obesity (BMI 30-39.9) 06/27/2014  . OSA (obstructive sleep apnea) 03/24/2014   Mild OSA with AHI 12.4/hr now on CPAP at 10cm H2O.  Persistent oxygen desats <88% for 11 minutes on CPAP and now on nighttime O2 at 2L via CPAP   . PONV (postoperative nausea and vomiting)  Position carefully due to Ray-Cage back surgeries  . Right ovarian cyst 09/12/2017  . Wears glasses     Current Outpatient Medications on File Prior to Visit  Medication Sig Dispense Refill  . allopurinol (ZYLOPRIM) 100 MG tablet Take 2 tablets by mouth daily.    . calcium-vitamin D (OSCAL WITH D) 500-200 MG-UNIT per tablet Take 1 tablet by mouth daily with breakfast.     . Dulaglutide (TRULICITY) 1.5 JT/7.0VX SOPN Inject 1.5 mg into the skin once a week.    . fexofenadine (ALLEGRA) 180 MG tablet Take 180 mg by mouth every evening.    . fish oil-omega-3 fatty acids 1000 MG capsule Take 1 g by mouth 2 (two) times daily.     . fluticasone (FLONASE) 50 MCG/ACT nasal spray as needed.    . hydrochlorothiazide (HYDRODIURIL)  25 MG tablet Take 25 mg by mouth daily 90 tablet 3  . losartan (COZAAR) 50 MG tablet Take 1 tablet (50 mg total) by mouth daily. 90 tablet 2  . meclizine (ANTIVERT) 25 MG tablet Take 25 mg by mouth 3 (three) times daily as needed for dizziness.     . meloxicam (MOBIC) 15 MG tablet Take 15 mg by mouth daily.    . metFORMIN (GLUCOPHAGE) 500 MG tablet Take 500 mg by mouth every evening.     . methylcellulose oral powder Take 1 packet by mouth as needed.     . mometasone (NASONEX) 50 MCG/ACT nasal spray Place 2 sprays into the nose at bedtime.     . nebivolol (BYSTOLIC) 10 MG tablet Take 1 tablet (10 mg total) by mouth every morning. 90 tablet 3  . omeprazole (PRILOSEC) 20 MG capsule Take 20 mg by mouth daily as needed (for acid reflux).     . rosuvastatin (CRESTOR) 10 MG tablet TAKE (1) TABLET THREE TIMES A WEEK 36 tablet 3   No current facility-administered medications on file prior to visit.    Allergies  Allergen Reactions  . Demerol [Meperidine] Other (See Comments)    AKA Demerol; reaction: severe sweating, changes in blood pressure, whole body got red sweating, very hot AKA Demerol; reaction: severe sweating, changes in blood pressure, whole body got red AKA Demerol; reaction: severe sweating, changes in blood pressure, whole body got red sweating, very hot  . Penicillins Hives and Other (See Comments)    As a child Has patient had a PCN reaction causing immediate rash, facial/tongue/throat swelling, SOB or lightheadedness with hypotension: Unknown Has patient had a PCN reaction causing severe rash involving mucus membranes or skin necrosis: Unknown Has patient had a PCN reaction that required hospitalization: Unknown Has patient had a PCN reaction occurring within the last 10 years: No If all of the above answers are "NO", then may proceed with Cephalosporin use.  Tolerates Ancef  . Sulfa Antibiotics Diarrhea and Other (See Comments)    Insomnia; extremely dry eyes  .  Sulfasalazine Diarrhea and Other (See Comments)    Insomnia; extremely dry eyes  . Triamterene Hives  . Amlodipine Besylate Other (See Comments)    REACTION: edema  . Arimidex [Anastrozole] Other (See Comments)    Causing patient to go into Congestive Heart Failure  . Lipitor [Atorvastatin] Other (See Comments)    Leg pain   . Lisinopril Cough  . Propoxyphene Other (See Comments)    Insomnia; extremely dry eyes  . Simvastatin Other (See Comments)    REACTION: made legs hurt  . Topiramate     Nose bleed  Assessment/Plan:  1. Weight loss -  Patient has not met weight loss goals on Trulicity 9.2EF once weekly and is looking for other options.  Will switch to Ozempic 0.38m once weekly with goal of titrating up to 131mand [facility] Patient has state health plan which should cover when she is Medicare eligible.  Patient has not met goal of at least 5% of body weight loss with comprehensive lifestyle modifications alone in the past 3-6 months. Pharmacotherapy is appropriate to pursue as augmentation. . Confirmed patient not pregnant and no personal or family history of medullary thyroid carcinoma (MTC) or Multiple Endocrine Neoplasia syndrome type 2 (MEN 2).   Gave patient Ozempic sample and trained how to use in room.    Advised patient on common side effects including nausea, diarrhea, dyspepsia, decreased appetite, and fatigue. Counseled patient on reducing meal size and how to titrate medication to minimize side effects. Counseled patient to call if intolerable side effects or if experiencing dehydration, abdominal pain, or dizziness. Patient will adhere to dietary modifications and will target at least 150 minutes of moderate intensity exercise weekly.   Follow up in 4 weeks to reweigh.  Stop Trulicity Start Ozempic 0.57m22mnce weekly.  ChrKarren CobbleharmD, BCACP, CDCLatimerPPHesperia[address]reLeithC [id number]: [phone]: [phone]/2022 5:09 PM

## 2020-10-18 NOTE — Patient Instructions (Addendum)
It was nice meeting you today!  We are going to stop Trulicity at this time and start a medication called Ozempic  You will inject 0.5mg  once a week  Continue to eat healthy and avoid sweets and processed foods  We will have you come back in about 4 weeks to check your weight and increase dose as tolerated  Karren Cobble, PharmD, BCACP, [place], [place] [address] [place], [place] [address] Phone: [phone]; Fax: [phone] 10/18/2020 8:20 AM

## 2020-10-29 NOTE — Progress Notes (Signed)
Patient ID: Ann Fernandez                 DOB: 07-11-1955                    MRN: [id number]     HPI: Ann Fernandez is a 65 y.o. female patient referred to lipid clinic by Dr. Johney Frame. PMH is significant for mild OSA, HTN, HLD, Pre-DM, and obesity. At last visit with Dr. Johney Frame, pt reported significant LE cramping with rosuvastatin and had failed atorvastatin and simvastatin.   Patient presents today in good spirits. Reports self-discontinuing rosuvastatin 10 mg 3x/weekly for the past 2 weeks due to LE cramping which has been occurring the last 9 months. Reports history of myalgia with atorvastatin, simvastatin, and pravastatin. Additionally, reports tolerating Ozempic 0.5 mg weekly well with no complaints. Denies GI issues. Reports third injection scheduled for this Thursday, June 9th.   Current Medications: rosuvastatin 10 mg 3x/week (not taking), fish oil 1 g BID  Intolerances: atorvastatin, simvastatin, and pravastatin 40 mg daily (myalgias)  Risk Factors: HTN, HLD, Fhx heart disease, hx of angina LDL goal: <100 mg/dL  Diet:  -Breakfast: 2 scrambled eggs, wheat toast -Lunch: salad, 1/2 sandwich, small cookie, apple, banana, berries -Dinner: grilled chicken, grilled fish, baked potato, vegetables, salad -Snacks: grapes -Drinks: homemade sweet tea, water, sugar free soft drink  Exercise: Walks 1.62miles/day without exertional symptoms.  Family History: COPD in her father; Colon cancer in her mother; Colon polyps in her father and mother; Deep vein thrombosis in her mother; Heart disease in her brother (open heart surgery at age 82), father (age of onset: 69), and mother (age of onset: 76); Lung disease in her mother; Osteoarthritis in her father; Pulmonary embolism in her brother; Rectal cancer (age of onset: 47) in her mother; Rheum arthritis in her daughter and father  Social History: denies tobacco use  Labs: 10/18/20: LDL 87, HDL 38, Trigs 221, TC 162 (rosuvastatin 10 mg  2-3x/week) 12/29/19: LDL 81, HDL 41, Trigs 194, TC 155 (rosuvastatin 10mg  3x/week)  Past Medical History:  Diagnosis Date  . Acute medial meniscal tear 05/25/2013  . Allergy   . Anxiety    regarding  upcoming breast surgery  . Arthritis, degenerative 09/26/2011  . 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 Breast, lumpectomy, Left - ATYPICAL DUCTAL HYPERPLASIA, SEE COMMENT. - SURGICAL MARGINS, NEGATIVE FOR ATYPIA OR MALIGNANCY. - PREVIOUS BIOPSY SITE IDENTIFIED. - ONE LYMPH NODE, NEGATIVE FOR TUMOR (0/1), 2. Breast, excision, Left medial margin - BENIGN BREAST TISSUE, SEE COMMENT. - NEGATIVE FOR ATYPIA OR MALIGNANCY.   . DDD (degenerative disc disease)   . Diabetes mellitus without complication ([place])   . DIVERTICULOSIS, COLON 12/21/2006   Qualifier: Diagnosis of  By: Leanne Chang MD, Bruce    . DOE (dyspnea on exertion) 01/20/2014  . Encounter for long-term (current) use of other medications 09/26/2011  . Essential hypertension 12/21/2006     Pt states she has a white coat syndrome and her BP is OK at home.     . Family history of adverse reaction to anesthesia    Anesthesia said they woke her mother up too fast and developed A-Fib .  Marland Kitchen GERD (gastroesophageal reflux disease)   . Gout   . Hearing loss   . HYPERLIPIDEMIA 12/21/2006   Qualifier: Diagnosis of  By: Leanne Chang MD, Bruce    . IBS (irritable bowel syndrome)   . Incarcerated incisional hernia s/p  lap repair w mesh 10/02/2017 10/02/2017  . Obesity (BMI 30-39.9) 06/27/2014  . OSA (obstructive sleep apnea) 03/24/2014   Mild OSA with AHI 12.4/hr now on CPAP at 10cm H2O.  Persistent oxygen desats <88% for 11 minutes on CPAP and now on nighttime O2 at 2L via CPAP   . PONV (postoperative nausea and vomiting)    Position carefully due to Ray-Cage back surgeries  . Right ovarian cyst 09/12/2017  . Wears glasses     Current Outpatient Medications on File Prior to Visit  Medication Sig Dispense Refill   . allopurinol (ZYLOPRIM) 100 MG tablet Take 2 tablets by mouth daily.    . calcium-vitamin D (OSCAL WITH D) 500-200 MG-UNIT per tablet Take 1 tablet by mouth daily with breakfast.     . fexofenadine (ALLEGRA) 180 MG tablet Take 180 mg by mouth every evening.    . fish oil-omega-3 fatty acids 1000 MG capsule Take 1 g by mouth 2 (two) times daily.     . fluticasone (FLONASE) 50 MCG/ACT nasal spray as needed.    . hydrochlorothiazide (HYDRODIURIL) 25 MG tablet Take 25 mg by mouth daily 90 tablet 3  . losartan (COZAAR) 50 MG tablet Take 1 tablet (50 mg total) by mouth daily. 90 tablet 2  . meclizine (ANTIVERT) 25 MG tablet Take 25 mg by mouth 3 (three) times daily as needed for dizziness.     . meloxicam (MOBIC) 15 MG tablet Take 15 mg by mouth daily.    . metFORMIN (GLUCOPHAGE) 500 MG tablet Take 500 mg by mouth every evening.     . methylcellulose oral powder Take 1 packet by mouth as needed.     . mometasone (NASONEX) 50 MCG/ACT nasal spray Place 2 sprays into the nose at bedtime.     . nebivolol (BYSTOLIC) 10 MG tablet Take 1 tablet (10 mg total) by mouth every morning. 90 tablet 3  . omeprazole (PRILOSEC) 20 MG capsule Take 20 mg by mouth daily as needed (for acid reflux).     . rosuvastatin (CRESTOR) 10 MG tablet TAKE (1) TABLET THREE TIMES A WEEK 36 tablet 3  . Semaglutide,0.25 or 0.5MG /DOS, (OZEMPIC, 0.25 OR 0.5 MG/DOSE,) 2 MG/1.5ML SOPN Inject 0.5 mg into the skin once a week. 1.5 mL 0   No current facility-administered medications on file prior to visit.    Allergies  Allergen Reactions  . Demerol [Meperidine] Other (See Comments)    AKA Demerol; reaction: severe sweating, changes in blood pressure, whole body got red sweating, very hot AKA Demerol; reaction: severe sweating, changes in blood pressure, whole body got red AKA Demerol; reaction: severe sweating, changes in blood pressure, whole body got red sweating, very hot  . Penicillins Hives and Other (See Comments)    As a  child Has patient had a PCN reaction causing immediate rash, facial/tongue/throat swelling, SOB or lightheadedness with hypotension: Unknown Has patient had a PCN reaction causing severe rash involving mucus membranes or skin necrosis: Unknown Has patient had a PCN reaction that required hospitalization: Unknown Has patient had a PCN reaction occurring within the last 10 years: No If all of the above answers are "NO", then may proceed with Cephalosporin use.  Tolerates Ancef  . Sulfa Antibiotics Diarrhea and Other (See Comments)    Insomnia; extremely dry eyes  . Sulfasalazine Diarrhea and Other (See Comments)    Insomnia; extremely dry eyes  . Triamterene Hives  . Amlodipine Besylate Other (See Comments)    REACTION: edema  .  Arimidex [Anastrozole] Other (See Comments)    Causing patient to go into Congestive Heart Failure  . Lipitor [Atorvastatin] Other (See Comments)    Leg pain   . Lisinopril Cough  . Propoxyphene Other (See Comments)    Insomnia; extremely dry eyes  . Simvastatin Other (See Comments)    REACTION: made legs hurt  . Topiramate     Nose bleed    Assessment/Plan:  1. Hyperlipidemia - LDL at goal <100 mg/dL, however intolerant to multiple statins including rosuvastatin 10 mg 3x/weekly, atorvastatin, simvastatin, and pravastatin. Discussed clinical benefits and potential side effects of PCSK9-inhibitors. Demonstrated proper injection technique with teach back method. PA approved and will start Praluent 75 mg every 2 weeks. Provided pharmacy with savings card information (BIN# [id number], PCN# [id number], GRP# [id number], ID# [id number]). Discontinued rosuvastatin secondary to myalgias. Encouraged patient to aim for a diet full of vegetables, fruit and lean meats (chicken, [place], fish) and to limit carbs (bread, pasta, sugar, rice) and red meat consumption. Encouraged patient to exercise 20-30 minutes daily with the goal of 150 minutes per week. Patient verbalized understanding.  Will recheck fasting lipid panel and LFTs in 3 months.   Lorel Monaco, PharmD, [facility] [address] [place], [place] [address] Phone: [phone]; Fax: [phone]

## 2020-10-30 ENCOUNTER — Other Ambulatory Visit: Payer: Self-pay

## 2020-10-30 ENCOUNTER — Ambulatory Visit (INDEPENDENT_AMBULATORY_CARE_PROVIDER_SITE_OTHER): Payer: BC Managed Care – PPO | Admitting: Pharmacist

## 2020-10-30 VITALS — Wt 232.8 lb

## 2020-10-30 DIAGNOSIS — E7849 Other hyperlipidemia: Secondary | ICD-10-CM

## 2020-10-30 MED ORDER — PRALUENT 75 MG/ML ~~LOC~~ SOAJ: 75.0000 mg | SUBCUTANEOUS | 11 refills | Status: DC

## 2020-10-30 NOTE — Patient Instructions (Signed)
Nice to see you today!  Keep up the good work with diet and exercise. Aim for a diet full of vegetables, fruit and lean meats (chicken, [place], fish). Try to limit carbs (bread, pasta, sugar, rice) and red meat consumption.  Your goal LDL is <100 mg/dL, you're currently at 87 mg/dL  Medication Changes:  Will send information to your insurance company regarding starting a PCSK-9 inhibitor injection. Will call you with the results soon.  Please give [place] a call at [phone] with any questions or concerns.   For PCSK9i, inject once every other week (any day of the week that works for you) into the fatty skin of stomach, upper outer thigh or back of the arm. Clean the site with soap and warm water or an alcohol pad. Keep the medication in the fridge until you are ready to give your dose, then take it out and let warm up to room temperature for 30-60 mins.

## 2020-11-08 ENCOUNTER — Telehealth: Payer: Self-pay | Admitting: Pharmacist

## 2020-11-08 DIAGNOSIS — R7302 Impaired glucose tolerance (oral): Secondary | ICD-10-CM

## 2020-11-08 MED ORDER — OZEMPIC (0.25 OR 0.5 MG/DOSE) 2 MG/1.5ML ~~LOC~~ SOPN: 0.5000 mg | PEN_INJECTOR | SUBCUTANEOUS | 0 refills | Status: DC

## 2020-11-08 NOTE — Telephone Encounter (Signed)
Called pt to follow up with Ozempic. After much discussion, pt states she has given 4 of the 0.25mg  weekly injections despite being advised to start on the 0.5mg  dose at 5/26 visit since she was transitioning over from Trulicity. She stated she was doing well on the 0.5mg  injections at her 6/7 lipid visit, now stating she has never given a 0.5mg  dose. Tolerating Ozempic well, states she has lost 2 lbs.  Will increase to 0.5mg  weekly x 4 weeks since pt did not do this last month as instructed. Rx sent to pharmacy along with copay card [facility] [id number], PCN [id number], GRP [id number], ID [id number]). Will call pt in 1 month to further titrate dose.

## 2020-11-08 NOTE — Addendum Note (Signed)
Addended by: Kunta Hilleary E on: 11/08/2020 04:35 PM   Modules accepted: Orders

## 2020-11-22 ENCOUNTER — Ambulatory Visit: Payer: BC Managed Care – PPO

## 2020-12-11 ENCOUNTER — Telehealth: Payer: Self-pay | Admitting: Student-PharmD

## 2020-12-11 NOTE — Telephone Encounter (Signed)
Called patient to follow up with Ozempic and see how she is doing after increasing to 0.5 mg weekly dose. She should be due to titrate to the 1 mg dose but we will need to send in Rx for this. Left message asking patient to return call.

## 2020-12-13 MED ORDER — OZEMPIC (1 MG/DOSE) 4 MG/3ML ~~LOC~~ SOPN: 1.0000 mg | PEN_INJECTOR | SUBCUTANEOUS | 11 refills | Status: DC

## 2020-12-13 NOTE — Telephone Encounter (Signed)
Patient called and LVM requesting refill. Spoke with patient who is ready to go to the 1mg  dose. She states she thinks medication is working. Rx for ozempic 1mg  sent to pharmacy. Will follow up in 4 weeks. At that point will see if her insurance will pay for [facility] 1.7mg  weekly.

## 2020-12-31 ENCOUNTER — Other Ambulatory Visit: Payer: Self-pay

## 2020-12-31 ENCOUNTER — Other Ambulatory Visit: Payer: BC Managed Care – PPO | Admitting: *Deleted

## 2020-12-31 DIAGNOSIS — E7800 Pure hypercholesterolemia, unspecified: Secondary | ICD-10-CM

## 2020-12-31 DIAGNOSIS — I10 Essential (primary) hypertension: Secondary | ICD-10-CM

## 2020-12-31 LAB — LIPID PANEL
Chol/HDL Ratio: 3.9 ratio (ref 0.0–4.4)
Cholesterol, Total: 140 mg/dL (ref 100–199)
HDL: 36 mg/dL — ABNORMAL LOW (ref >39)
LDL Chol Calc (NIH): 61 mg/dL (ref 0–99)
Triglycerides: 270 mg/dL — ABNORMAL HIGH (ref 0–149)
VLDL Cholesterol Cal: 43 mg/dL — ABNORMAL HIGH (ref 5–40)

## 2020-12-31 LAB — ALT: ALT: 35 IU/L — ABNORMAL HIGH (ref 0–32)

## 2021-01-02 ENCOUNTER — Telehealth: Payer: Self-pay

## 2021-01-02 MED ORDER — FENOFIBRATE 145 MG PO TABS
145.0000 mg | ORAL_TABLET | Freq: Every day | ORAL | 3 refills | Status: DC
Start: 2021-01-02 — End: 2021-12-30

## 2021-01-02 NOTE — Telephone Encounter (Signed)
The patient has been notified of the result and verbalized understanding.  All questions (if any) were answered. Antonieta Iba, RN 01/02/2021 9:37 AM  Patient confirms that she was fasting for the lab work. I have advised her on decreasing her intake of carbs and sugars. She does not drink alcohol. She will start on fenofibrate 145 mg daily.

## 2021-01-02 NOTE — Telephone Encounter (Signed)
-----   Message from Leeroy Bock, [place] sent at 01/01/2021  4:48 PM EDT ----- LDL at goal since starting Praluent, TG remain elevated. Would confirm pt was fasting when labs were drawn and that she's watching intake of carbs, sugar and alcohol. If so, recommend adding fenofibrate '145mg'$  once daily to lower TG ~50% and continue on Praluent.

## 2021-01-09 NOTE — Progress Notes (Signed)
Patient ID: TAKERRIA PUMA                 DOB: 08/06/1955                    MRN: [id number]     HPI: Ann Fernandez is a 66 y.o. female patient referred to pharmacy clinic by Johney Frame to initiate weight loss therapy with GLP1-RA. PMH is significant for obesity complicated by chronic medical conditions including HTN, Pre DM, and HLD. Most recent BMI 36.71, previous BMI 38.44.  Patient presents today for her 3 month follow up. Currently on Ozempic '1mg'$  weekly. PA for [facility] 1.7 has been submitted, awaiting response. She currently has the state health plan BCBS, but will turn 69 in Sept and has to choose medicare plan or keep BCBS and pay out of pocket for an RX plan.   She feels like the medication is helping her. She gets fuller faster and thinks she is eating less. Has lost 11 lb. BP high in office today, but per pt always high in the office. At home BP is 120-130/80-85. Will get updated lipids and A1C today. Patient has been started on Praluent since last visit. Fenofibrate was started just a few days ago for elevated TG.   Current weight management medications: ozempic '1mg'$   Previously tried meds: Trulicity  Current meds that may affect weight: none  Baseline weight/BMI: 231.0#  38.44kg/m2  Insurance payor: BCBS state health plan  Diet:  -Breakfast: 2 scrambled eggs, sarah lee 45 cal bread and tomatoes -Lunch: salad, 1/2 sandwich, apple, banana, berries, tuna salad, pickled rellish, small amount of mayo -Dinner: grilled chicken, grilled fish, baked potato, vegetables, salad -Snacks: grapes (if she wants someone sweet- eats a pack of peanuts, a few M&M, raisins) -Drinks: homemade sweet tea (once a day), water, sugar free soft drink  Labs: A1c 6.2  On 10/29/19.  LDL 81, HDL 41, Trigs 194, TC 155 on rosuvastatin '10mg'$  (12/29/19) 12/31/20 TC 140, TG 270, HDL 36, LDL 61   Exercise:  stretches, walking (1 mile at a time x 2-3 times per day) 5 days a week, garden  Family History:  Family  History  Problem Relation Age of Onset   Heart disease Mother    Deep vein thrombosis Mother    Colon cancer Mother    Rectal cancer Mother 45   Lung disease Mother    Colon polyps Mother    Heart disease Father    COPD Father    Rheum arthritis Father    Osteoarthritis Father    Colon polyps Father    Heart disease Brother    Pulmonary embolism Brother    Rheum arthritis Daughter    Esophageal cancer Neg Hx    Stomach cancer Neg Hx      Social History:  Social History   Socioeconomic History   Marital status: Married    Spouse name: Not on file   Number of children: Not on file   Years of education: Not on file   Highest education level: Not on file  Occupational History   Not on file  Tobacco Use   Smoking status: Never   Smokeless tobacco: Never  Vaping Use   Vaping Use: Never used  Substance and Sexual Activity   Alcohol use: No   Drug use: No   Sexual activity: Yes    Partners: Male    Comment: [facility]  Other Topics Concern   Not on file  Social  History Narrative   Not on file   Social Determinants of Health   Financial Resource Strain: Not on file  Food Insecurity: Not on file  Transportation Needs: Not on file  Physical Activity: Not on file  Stress: Not on file  Social Connections: Not on file  Intimate Partner Violence: Not on file      Labs: Lab Results  Component Value Date   HGBA1C 6.0 (H) 09/24/2017    Wt Readings from Last 1 Encounters:  10/30/20 232 lb 12.8 oz (105.6 kg)    BP Readings from Last 1 Encounters:  10/18/20 (!) 158/90   Pulse Readings from Last 1 Encounters:  10/18/20 63       Component Value Date/Time   CHOL 140 12/31/2020 0815   TRIG 270 (H) 12/31/2020 0815   HDL 36 (L) 12/31/2020 0815   CHOLHDL 3.9 12/31/2020 0815   CHOLHDL 4.4 10/12/2015 1114   VLDL 44 (H) 10/12/2015 1114   LDLCALC 61 12/31/2020 0815   LDLDIRECT 172.8 06/29/2012 1003    Past Medical History:  Diagnosis Date   Acute medial meniscal  tear 05/25/2013   Allergy    Anxiety    regarding  upcoming breast surgery   Arthritis, degenerative 09/26/2011   Atypical ductal hyperplasia, 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 Breast, lumpectomy, Left - ATYPICAL DUCTAL HYPERPLASIA, SEE COMMENT. - SURGICAL MARGINS, NEGATIVE FOR ATYPIA OR MALIGNANCY. - PREVIOUS BIOPSY SITE IDENTIFIED. - ONE LYMPH NODE, NEGATIVE FOR TUMOR (0/1), 2. Breast, excision, Left medial margin - BENIGN BREAST TISSUE, SEE COMMENT. - NEGATIVE FOR ATYPIA OR MALIGNANCY.    DDD (degenerative disc disease)    Diabetes mellitus without complication ([place])    DIVERTICULOSIS, COLON 12/21/2006   Qualifier: Diagnosis of  By: Leanne Chang MD, Bruce     DOE (dyspnea on exertion) 01/20/2014   Encounter for long-term (current) use of other medications 09/26/2011   Essential hypertension 12/21/2006     Pt states she has a white coat syndrome and her BP is OK at home.      Family history of adverse reaction to anesthesia    Anesthesia said they woke her mother up too fast and developed A-Fib .   GERD (gastroesophageal reflux disease)    Gout    Hearing loss    HYPERLIPIDEMIA 12/21/2006   Qualifier: Diagnosis of  By: Leanne Chang MD, Bruce     IBS (irritable bowel syndrome)    Incarcerated incisional hernia s/p lap repair w mesh 10/02/2017 10/02/2017   Obesity (BMI 30-39.9) 06/27/2014   OSA (obstructive sleep apnea) 03/24/2014   Mild OSA with AHI 12.4/hr now on CPAP at 10cm H2O.  Persistent oxygen desats <88% for 11 minutes on CPAP and now on nighttime O2 at 2L via CPAP    PONV (postoperative nausea and vomiting)    Position carefully due to Ray-Cage back surgeries   Right ovarian cyst 09/12/2017   Wears glasses     Current Outpatient Medications on File Prior to Visit  Medication Sig Dispense Refill   Alirocumab (PRALUENT) 75 MG/ML SOAJ Inject 75 mg into the skin every 14 (fourteen) days. 2 mL 11   allopurinol (ZYLOPRIM) 100 MG tablet Take 2 tablets by mouth daily.      calcium-vitamin D (OSCAL WITH D) 500-200 MG-UNIT per tablet Take 1 tablet by mouth daily with breakfast.      fenofibrate (TRICOR) 145 MG tablet Take 1 tablet (145 mg total) by mouth daily. 90 tablet 3  fexofenadine (ALLEGRA) 180 MG tablet Take 180 mg by mouth every evening.     fish oil-omega-3 fatty acids 1000 MG capsule Take 1 g by mouth 2 (two) times daily.      fluticasone (FLONASE) 50 MCG/ACT nasal spray as needed.     hydrochlorothiazide (HYDRODIURIL) 25 MG tablet Take 25 mg by mouth daily 90 tablet 3   losartan (COZAAR) 50 MG tablet Take 1 tablet (50 mg total) by mouth daily. 90 tablet 2   meclizine (ANTIVERT) 25 MG tablet Take 25 mg by mouth 3 (three) times daily as needed for dizziness.      meloxicam (MOBIC) 15 MG tablet Take 15 mg by mouth daily.     metFORMIN (GLUCOPHAGE) 500 MG tablet Take 500 mg by mouth every evening.      methylcellulose oral powder Take 1 packet by mouth as needed.      mometasone (NASONEX) 50 MCG/ACT nasal spray Place 2 sprays into the nose at bedtime.      nebivolol (BYSTOLIC) 10 MG tablet Take 1 tablet (10 mg total) by mouth every morning. 90 tablet 3   omeprazole (PRILOSEC) 20 MG capsule Take 20 mg by mouth daily as needed (for acid reflux).      Semaglutide, 1 MG/DOSE, (OZEMPIC, 1 MG/DOSE,) 4 MG/3ML SOPN Inject 1 mg into the skin once a week. 3 mL 11   No current facility-administered medications on file prior to visit.    Allergies  Allergen Reactions   Demerol [Meperidine] Other (See Comments)    AKA Demerol; reaction: severe sweating, changes in blood pressure, whole body got red sweating, very hot AKA Demerol; reaction: severe sweating, changes in blood pressure, whole body got red AKA Demerol; reaction: severe sweating, changes in blood pressure, whole body got red sweating, very hot   Penicillins Hives and Other (See Comments)    As a child Has patient had a PCN reaction causing immediate rash, facial/tongue/throat swelling, SOB or  lightheadedness with hypotension: Unknown Has patient had a PCN reaction causing severe rash involving mucus membranes or skin necrosis: Unknown Has patient had a PCN reaction that required hospitalization: Unknown Has patient had a PCN reaction occurring within the last 10 years: No If all of the above answers are "NO", then may proceed with Cephalosporin use.  Tolerates Ancef   Sulfa Antibiotics Diarrhea and Other (See Comments)    Insomnia; extremely dry eyes   Sulfasalazine Diarrhea and Other (See Comments)    Insomnia; extremely dry eyes   Triamterene Hives   Amlodipine Besylate Other (See Comments)    REACTION: edema   Arimidex [Anastrozole] Other (See Comments)    Causing patient to go into Congestive Heart Failure   Lipitor [Atorvastatin] Other (See Comments)    Leg pain    Lisinopril Cough   Propoxyphene Other (See Comments)    Insomnia; extremely dry eyes   Simvastatin Other (See Comments)    REACTION: made legs hurt   Topiramate     Nose bleed     Assessment/Plan:  1. Weight loss - Patient has lost 4.7% of her body weight thus far. Still titrating ozempic. Awaiting to see if insurance will pay for [facility]. If not, will see if they will pay for Ozempic '2mg'$ . We did discuss that medicare will not pay for [facility] and the typical costs of Ozempic and Praluent on Medicare. Patient will let us know when she makes a decision about insurance and will get [place] the information so we can do the prior  authorizations.  Congratulated her on her weight loss and good exercise program. Encouraged her to stop drinking sugary beverage intake. She does make her own sweet tea (1/2-3/4 cup of sugar per gallon of tea).   Follow up in 3 months.  Thank you,  Ramond Dial, Pharm.D, BCPS, CPP [place]  [id number] [place], [place] [address]  Phone: [phone]; Fax: [phone]

## 2021-01-10 ENCOUNTER — Ambulatory Visit (INDEPENDENT_AMBULATORY_CARE_PROVIDER_SITE_OTHER): Payer: BC Managed Care – PPO | Admitting: Pharmacist

## 2021-01-10 ENCOUNTER — Other Ambulatory Visit: Payer: Self-pay

## 2021-01-10 ENCOUNTER — Telehealth: Payer: Self-pay | Admitting: Pharmacist

## 2021-01-10 VITALS — BP 150/82 | Wt 220.6 lb

## 2021-01-10 DIAGNOSIS — E7800 Pure hypercholesterolemia, unspecified: Secondary | ICD-10-CM | POA: Diagnosis not present

## 2021-01-10 DIAGNOSIS — R7303 Prediabetes: Secondary | ICD-10-CM

## 2021-01-10 DIAGNOSIS — E669 Obesity, unspecified: Secondary | ICD-10-CM

## 2021-01-10 DIAGNOSIS — Z6836 Body mass index (BMI) 36.0-36.9, adult: Secondary | ICD-10-CM | POA: Diagnosis not present

## 2021-01-10 LAB — HEMOGLOBIN A1C
Est. average glucose Bld gHb Est-mCnc: 128 mg/dL
Hgb A1c MFr Bld: 6.1 % — ABNORMAL HIGH (ref 4.8–5.6)

## 2021-01-10 NOTE — Telephone Encounter (Signed)
I asked lab to cancel the lipid panel and direct LDL since pt had lipid panel drawn 10 days ago. A1C was re-ordered as this lab was still needed.

## 2021-01-10 NOTE — Patient Instructions (Addendum)
Keep up the great work!  I will let you know when I hear back about Wegovy or if we can increase Ozempic to '2mg'$   Call me at [phone] with any questions  Keep an eye om BP. Goal is <130/80  Call us when you get new insurance

## 2021-01-11 MED ORDER — WEGOVY 1.7 MG/0.75ML ~~LOC~~ SOAJ: 1.7000 mg | SUBCUTANEOUS | 0 refills | Status: DC

## 2021-01-11 NOTE — Telephone Encounter (Addendum)
Called patient and let her know that Mancel Parsons was approved. Gave her instructions on how to get a copay card for [facility]. Stop Ozempic. I will give her a call in a month to follow up. A1C improved from 6.2 to 6.1

## 2021-01-11 NOTE — Telephone Encounter (Signed)
PA for [facility] approved. Rx sent to [facility]. Patient can get copay card from [email or web address]://[email or web address].[email or web address]/wegovy/savings-card.[email or web address]

## 2021-02-08 ENCOUNTER — Telehealth: Payer: Self-pay | Admitting: Pharmacist

## 2021-02-08 MED ORDER — OZEMPIC (2 MG/DOSE) 8 MG/3ML ~~LOC~~ SOPN: 2.0000 mg | PEN_INJECTOR | SUBCUTANEOUS | 3 refills | Status: DC

## 2021-02-08 NOTE — Telephone Encounter (Signed)
[employer] medicare PPO with state health plan Rx BIN: [id number] PCN: [id number] GRP: [id number] ID [id number]  Spoke to patient. She has taken 4 injections of wegovy 1.'7mg'$ . She is doing pretty good on it. Feels like she only lost a few pounds. Nauseous and constipated on and off. Willing to continue therapy. She is now on the state health plan [employer] medicare plan. They do actually cover wegovy at a tier 3. Ozempic '2mg'$  is a tier 2 with no PA needed. Will change to Ozempic '2mg'$  to save $.  She is also on praluent, but Repatha is the preferred agent. Will submit PA for Repatha.  Rx to go to [facility].

## 2021-02-12 MED ORDER — REPATHA SURECLICK 140 MG/ML ~~LOC~~ SOAJ: 1.0000 "pen " | SUBCUTANEOUS | 11 refills | Status: DC

## 2021-02-12 NOTE — Telephone Encounter (Signed)
Repatha approved through 08/11/21. Pt switching to Repatha due to it being on a lower tier than Praluent with new insurance. Patient made aware

## 2021-03-05 ENCOUNTER — Telehealth: Payer: Self-pay | Admitting: Pharmacist

## 2021-03-05 NOTE — Telephone Encounter (Signed)
Calling patient to see how she is doing on Ozempic 2mg  weekly. She states she was not able to get 2mg , still on backorder. She has 1 more 1mg  injection. She will either get the 1mg  refilled until the 2mg  is available or she will let me know is she decides to go back to [facility] 1.7 if we need to do the PA (has the [address] health plan-tier 3).   She is doing Silver sneakers video twice a day

## 2021-04-03 NOTE — Progress Notes (Signed)
Patient ID: Ann Fernandez                 DOB: [birth year]                    MRN: [id number]     HPI: Ann Fernandez is a 65 y.o. female patient of Dr Johney Frame referred to PharmD for GLP1-RA therapy for weight loss. PMH is significant for obesity complicated by chronic medical conditions including HTN, pre-DM, and HLD. Most recent BMI 36.78, previous BMI 38.44. Previously took [place] however when she changed to [place] was cheaper (Tier 2 - $40/1 month or $80/3 months) compared to [facility] (Tier 3 - $64/1 month or $128/3 months).  Patient presents today for her 6 month follow up. Has been taking Ozempic 1mg  weekly since 2mg  dose has been on backorder recently. Reports she just picked up a 1 month supply of the 2mg  dose yesterday and will give her first injection today. Reports tolerating Ozempic well. Decreased portion sizes as she feels full faster. Has lost 11 lbs since starting Ozempic. Clinic BP tends to run higher than home BP which is 130/70-80. Recently changed from [place] to [place] based on formulary coverage and was also started on fenofibrate within the past few months for elevated TG. Previously experienced myalgias on atorvastatin, simvastatin, and pravastatin 40mg  daily. Has cut back on sweet tea, has a glass about 3 days a week now (more in summer). Drinks water when she goes out to restaurants.  Current weight management medications: Ozempic 1mg  weekly  Previously tried meds: Trulicity  Current meds that may affect weight: none  Baseline weight/BMI: 231 lbs, 38.44kg/m2  Insurance payor: [employer] state health plan - Medicare  Diet:  -Breakfast: 2 scrambled eggs, sarah lee 45 cal bread and tomatoes -Lunch: salad, 1/2 sandwich, apple, banana, berries, tuna salad, pickled rellish, small amount of mayo -Dinner: grilled chicken, grilled fish, baked potato, vegetables, salad -Snacks: grapes (if she wants someone sweet- eats a pack of peanuts, a few M&Ms,  raisins) -Drinks: homemade sweet tea (once a day), water, sugar free soft drink  Labs: A1c 6.2  On 10/29/19.  LDL 81, HDL 41, Trigs 194, TC 155 on rosuvastatin 10mg  (12/29/19) 12/31/20 TC 140, TG 270, HDL 36, LDL 61   Exercise: Walks 6,000 steps a day, hand weights, [employer]  Family History:  Family History  Problem Relation Age of Onset   Heart disease Mother    Deep vein thrombosis Mother    Colon cancer Mother    Rectal cancer Mother 66   Lung disease Mother    Colon polyps Mother    Heart disease Father    COPD Father    Rheum arthritis Father    Osteoarthritis Father    Colon polyps Father    Heart disease Brother    Pulmonary embolism Brother    Rheum arthritis Daughter    Esophageal cancer Neg Hx    Stomach cancer Neg Hx      Social History:  Social History   Socioeconomic History   Marital status: Married    Spouse name: Not on file   Number of children: Not on file   Years of education: Not on file   Highest education level: Not on file  Occupational History   Not on file  Tobacco Use   Smoking status: Never   Smokeless tobacco: Never  Vaping Use   Vaping Use: Never used  Substance and Sexual  Activity   Alcohol use: No   Drug use: No   Sexual activity: Yes    Partners: Male    Comment: TVH  Other Topics Concern   Not on file  Social History Narrative   Not on file   Social Determinants of Health   Financial Resource Strain: Not on file  Food Insecurity: Not on file  Transportation Needs: Not on file  Physical Activity: Not on file  Stress: Not on file  Social Connections: Not on file  Intimate Partner Violence: Not on file      Labs: Lab Results  Component Value Date   HGBA1C 6.1 (H) 01/10/2021    Wt Readings from Last 1 Encounters:  01/10/21 220 lb 9.6 oz (100.1 kg)    BP Readings from Last 1 Encounters:  01/10/21 (!) 150/82   Pulse Readings from Last 1 Encounters:  10/18/20 63       Component Value Date/Time    CHOL 140 12/31/2020 0815   TRIG 270 (H) 12/31/2020 0815   HDL 36 (L) 12/31/2020 0815   CHOLHDL 3.9 12/31/2020 0815   CHOLHDL 4.4 10/12/2015 1114   VLDL 44 (H) 10/12/2015 1114   LDLCALC 61 12/31/2020 0815   LDLDIRECT 172.8 06/29/2012 1003    Past Medical History:  Diagnosis Date   Acute medial meniscal tear 05/25/2013   Allergy    Anxiety    regarding  upcoming breast surgery   Arthritis, degenerative 09/26/2011   Atypical ductal hyperplasia, 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 Breast, lumpectomy, Left - ATYPICAL DUCTAL HYPERPLASIA, SEE COMMENT. - SURGICAL MARGINS, NEGATIVE FOR ATYPIA OR MALIGNANCY. - PREVIOUS BIOPSY SITE IDENTIFIED. - ONE LYMPH NODE, NEGATIVE FOR TUMOR (0/1), 2. Breast, excision, Left medial margin - BENIGN BREAST TISSUE, SEE COMMENT. - NEGATIVE FOR ATYPIA OR MALIGNANCY.    DDD (degenerative disc disease)    Diabetes mellitus without complication ([place])    DIVERTICULOSIS, COLON 12/21/2006   Qualifier: Diagnosis of  By: Leanne Chang MD, Bruce     DOE (dyspnea on exertion) 01/20/2014   Encounter for long-term (current) use of other medications 09/26/2011   Essential hypertension 12/21/2006     Pt states she has a white coat syndrome and her BP is OK at home.      Family history of adverse reaction to anesthesia    Anesthesia said they woke her mother up too fast and developed A-Fib .   GERD (gastroesophageal reflux disease)    Gout    Hearing loss    HYPERLIPIDEMIA 12/21/2006   Qualifier: Diagnosis of  By: Leanne Chang MD, Bruce     IBS (irritable bowel syndrome)    Incarcerated incisional hernia s/p lap repair w mesh 10/02/2017 10/02/2017   Obesity (BMI 30-39.9) 06/27/2014   OSA (obstructive sleep apnea) 03/24/2014   Mild OSA with AHI 12.4/hr now on CPAP at 10cm H2O.  Persistent oxygen desats <88% for 11 minutes on CPAP and now on nighttime O2 at 2L via CPAP    PONV (postoperative nausea and vomiting)    Position carefully due to Ray-Cage back surgeries    Right ovarian cyst 09/12/2017   Wears glasses     Current Outpatient Medications on File Prior to Visit  Medication Sig Dispense Refill   allopurinol (ZYLOPRIM) 100 MG tablet Take 2 tablets by mouth daily.     calcium-vitamin D (OSCAL WITH D) 500-200 MG-UNIT per tablet Take 1 tablet by mouth daily with breakfast.      Evolocumab (REPATHA  SURECLICK) 644 MG/ML SOAJ Inject 1 pen into the skin every 14 (fourteen) days. 2 mL 11   fenofibrate (TRICOR) 145 MG tablet Take 1 tablet (145 mg total) by mouth daily. 90 tablet 3   fexofenadine (ALLEGRA) 180 MG tablet Take 180 mg by mouth every evening.     fish oil-omega-3 fatty acids 1000 MG capsule Take 1 g by mouth 2 (two) times daily.      fluticasone (FLONASE) 50 MCG/ACT nasal spray as needed.     hydrochlorothiazide (HYDRODIURIL) 25 MG tablet Take 25 mg by mouth daily 90 tablet 3   losartan (COZAAR) 50 MG tablet Take 1 tablet (50 mg total) by mouth daily. 90 tablet 2   meclizine (ANTIVERT) 25 MG tablet Take 25 mg by mouth 3 (three) times daily as needed for dizziness.      meloxicam (MOBIC) 15 MG tablet Take 15 mg by mouth daily.     metFORMIN (GLUCOPHAGE) 500 MG tablet Take 500 mg by mouth every evening.      methylcellulose oral powder Take 1 packet by mouth as needed.      mometasone (NASONEX) 50 MCG/ACT nasal spray Place 2 sprays into the nose at bedtime.      nebivolol (BYSTOLIC) 10 MG tablet Take 1 tablet (10 mg total) by mouth every morning. 90 tablet 3   omeprazole (PRILOSEC) 20 MG capsule Take 20 mg by mouth daily as needed (for acid reflux).      Semaglutide, 2 MG/DOSE, (OZEMPIC, 2 MG/DOSE,) 8 MG/3ML SOPN Inject 2 mg into the skin once a week. 9 mL 3   No current facility-administered medications on file prior to visit.    Allergies  Allergen Reactions   Demerol [Meperidine] Other (See Comments)    AKA Demerol; reaction: severe sweating, changes in blood pressure, whole body got red sweating, very hot AKA Demerol; reaction: severe  sweating, changes in blood pressure, whole body got red AKA Demerol; reaction: severe sweating, changes in blood pressure, whole body got red sweating, very hot   Penicillins Hives and Other (See Comments)    As a child Has patient had a PCN reaction causing immediate rash, facial/tongue/throat swelling, SOB or lightheadedness with hypotension: Unknown Has patient had a PCN reaction causing severe rash involving mucus membranes or skin necrosis: Unknown Has patient had a PCN reaction that required hospitalization: Unknown Has patient had a PCN reaction occurring within the last 10 years: No If all of the above answers are "NO", then may proceed with Cephalosporin use.  Tolerates Ancef   Sulfa Antibiotics Diarrhea and Other (See Comments)    Insomnia; extremely dry eyes   Sulfasalazine Diarrhea and Other (See Comments)    Insomnia; extremely dry eyes   Triamterene Hives   Amlodipine Besylate Other (See Comments)    REACTION: edema   Arimidex [Anastrozole] Other (See Comments)    Causing patient to go into Congestive Heart Failure   Lipitor [Atorvastatin] Other (See Comments)    Leg pain    Lisinopril Cough   Propoxyphene Other (See Comments)    Insomnia; extremely dry eyes   Simvastatin Other (See Comments)    REACTION: made legs hurt   Topiramate     Nose bleed     Assessment/Plan:  1. Weight loss - Patient has lost 11 lbs so far on Ozempic (~5%). Picked up rx for higher dose of Ozempic 2mg  weekly, however this strength has still generally been on backorder. If pharmacy is unable to refill 2mg  dose regularly, she  is aware to pick up refill of 1mg  dose instead. Hoping shortage of 2mg  dose is resolved by the end of the year. Pt will continue with regular exercise and heart healthy diet. Rechecking A1c today on Ozempic.  2. Hyperlipidemia - Rechecking lipids today as pt was changed from [place] to [place] a few months ago due to formulary change and was started on fenofibrate for  elevated TG. Intolerant to 3 statins. LDL goal < 100 for primary prevention.  3. Hypertension - BP reading in clinic a bit elevated above goal < 130/71mmHg, however pt reports white coat HTN. She monitors BP at home and reports readings are right around 130/80. Will continue losartan 5mg  daily, HCTZ 25mg  daily, and Bystolic 10mg  daily.  F/u with PharmD as needed.  Jiaire Rosebrook E. Markon Jares, PharmD, BCACP, [place] [address], [place], [place] [address] Phone: [phone]; Fax: [phone] 04/04/2021 9:10 AM

## 2021-04-04 ENCOUNTER — Ambulatory Visit (INDEPENDENT_AMBULATORY_CARE_PROVIDER_SITE_OTHER): Payer: Medicare PPO | Admitting: Pharmacist

## 2021-04-04 ENCOUNTER — Other Ambulatory Visit: Payer: Self-pay

## 2021-04-04 VITALS — BP 142/84 | HR 73 | Ht 65.0 in | Wt 221.0 lb

## 2021-04-04 DIAGNOSIS — R7303 Prediabetes: Secondary | ICD-10-CM | POA: Diagnosis not present

## 2021-04-04 DIAGNOSIS — Z6836 Body mass index (BMI) 36.0-36.9, adult: Secondary | ICD-10-CM

## 2021-04-04 DIAGNOSIS — E669 Obesity, unspecified: Secondary | ICD-10-CM

## 2021-04-04 DIAGNOSIS — I10 Essential (primary) hypertension: Secondary | ICD-10-CM

## 2021-04-04 DIAGNOSIS — E7849 Other hyperlipidemia: Secondary | ICD-10-CM

## 2021-04-04 LAB — LIPID PANEL
Chol/HDL Ratio: 3.4 ratio (ref 0.0–4.4)
Cholesterol, Total: 152 mg/dL (ref 100–199)
HDL: 45 mg/dL (ref >39)
LDL Chol Calc (NIH): 86 mg/dL (ref 0–99)
Triglycerides: 114 mg/dL (ref 0–149)
VLDL Cholesterol Cal: 21 mg/dL (ref 5–40)

## 2021-04-04 LAB — HEMOGLOBIN A1C
Est. average glucose Bld gHb Est-mCnc: 120 mg/dL
Hgb A1c MFr Bld: 5.8 % — ABNORMAL HIGH (ref 4.8–5.6)

## 2021-04-04 NOTE — Patient Instructions (Addendum)
It was nice to see you today  We'll check your cholesterol and A1c today  Continue taking your current medications  Call Dominic Rhome, PharmD with any concerns about your Ozempic or cholesterol medications [phone]

## 2021-07-17 ENCOUNTER — Encounter: Payer: Self-pay | Admitting: Obstetrics and Gynecology

## 2021-09-12 ENCOUNTER — Other Ambulatory Visit: Payer: Self-pay | Admitting: Cardiology

## 2021-09-16 NOTE — Progress Notes (Signed)
66 y.o. G24P2002 Married Caucasian female here for annual exam.   ? ?Patient wants to discuss need for next BMD. ?Asking about calcium intake.  ? ?No concerns about bladder or bowel function.  ? ?On Ozempic and had some constipation symptoms.  ?Using Citrocel and Benefiber and occasional stool softener.  ?Hx enterocele/rectocele surgery. ? ?Notes some burning pain in her feet.  Asking my opinion.  ?Used Gabapentin in the past following her back surgery.  Not on it now.  ? ?PCP:   Stoney Bang, MD ? ?Patient's last menstrual period was 05/26/1993 (exact date).     ?  ?    ?Sexually active: Yes.    ?The current method of family planning is status post hysterectomy.    ?Exercising: Yes.     Silver sneakers, pilates, walking   ?Smoker:  no ? ?Health Maintenance: ?Pap:  2009 Neg ?History of abnormal Pap:  yes, Hx of cryotherapy to cervix ?MMG:  07-17-21 Neg/BiRads1 ?Colonoscopy:  01-13-19 polyp;next due 12/2023. Mom with hx of colon cancer ?BMD: 11-24-18  Result :Normal ?TDaP:  06-25-16 ?Gardasil:   n/a ?LZJ:QBHALPF blood in the past ?Hep C: donated blood in the past ?Screening Labs:  PCP ? ? reports that she has never smoked. She has never used smokeless tobacco. She reports that she does not drink alcohol and does not use drugs. ? ?Past Medical History:  ?Diagnosis Date  ? Acute medial meniscal tear 05/25/2013  ? Allergy   ? Anxiety   ? regarding  upcoming breast surgery  ? Arthritis, degenerative 09/26/2011  ? 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 Left breast 2015 FINAL DIAGNOSIS Diagnosis 1. Breast, lumpectomy, Left - ATYPICAL DUCTAL HYPERPLASIA, SEE COMMENT. - SURGICAL MARGINS, NEGATIVE FOR ATYPIA OR MALIGNANCY. - PREVIOUS BIOPSY SITE IDENTIFIED. - ONE LYMPH NODE, NEGATIVE FOR TUMOR (0/1), 2. Breast, excision, Left medial margin - BENIGN BREAST TISSUE, SEE COMMENT. - NEGATIVE FOR ATYPIA OR MALIGNANCY.   ? DDD (degenerative disc disease)   ? Diabetes mellitus without complication ([place])   ? DIVERTICULOSIS, COLON  12/21/2006  ? Qualifier: Diagnosis of  By: Leanne Chang MD, Bruce    ? DOE (dyspnea on exertion) 01/20/2014  ? Encounter for long-term (current) use of other medications 09/26/2011  ? Essential hypertension 12/21/2006  ?   Pt states she has a white coat syndrome and her BP is OK at home.     ? Family history of adverse reaction to anesthesia   ? Anesthesia said they woke her mother up too fast and developed A-Fib .  ? GERD (gastroesophageal reflux disease)   ? Gout   ? Hearing loss   ? HYPERLIPIDEMIA 12/21/2006  ? Qualifier: Diagnosis of  By: Leanne Chang MD, Bruce    ? IBS (irritable bowel syndrome)   ? Incarcerated incisional hernia s/p lap repair w mesh 10/02/2017 10/02/2017  ? Obesity (BMI 30-39.9) 06/27/2014  ? OSA (obstructive sleep apnea) 03/24/2014  ? Mild OSA with AHI 12.4/hr now on CPAP at 10cm H2O.  Persistent oxygen desats <88% for 11 minutes on CPAP and now on nighttime O2 at 2L via CPAP   ? PONV (postoperative nausea and vomiting)   ? Position carefully due to Ray-Cage back surgeries  ? Right ovarian cyst 09/12/2017  ? Wears glasses   ? ? ?Past Surgical History:  ?Procedure Laterality Date  ? ABDOMINAL HYSTERECTOMY  1994  ? TVH for DUB--ovaries retained    ? ANTERIOR LUMBAR FUSION    ? ANTERIOR LUMBAR FUSION  2018  ?  [facility] Dr Jinny Sanders.  2 level ALIF at L4-5 and L5-S1  ? BACK SURGERY  2011  ? lumb cyst  ? BACK SURGERY  04/18/2016  ? Anterior and posterior spinal fusion  ? BREAST SURGERY    ? CERVICAL LAMINECTOMY    ? COLONOSCOPY  09/09/2012  ? CYST REMOVAL TRUNK    ? LOWER BACK  ? DILATION AND CURETTAGE OF UTERUS    ? INSERTION OF MESH N/A 10/02/2017  ? Procedure: INSERTION OF MESH;  Surgeon: Michael Boston, MD;  Location: [facility];  Service: General;  Laterality: N/A;  ? JOINT REPLACEMENT    ? partial right knee  ? KNEE ARTHROSCOPY    ? KNEE ARTHROSCOPY Right 05/25/2013  ? Procedure: RIGHT ARTHROSCOPY KNEE, PARTIAL MEDIAL MENISCECTOMY, CHONDROPLASTY, PARTIAL LATERAL MENISCECTOMY;  Surgeon: Gearlean Alf, MD;   Location: [facility];  Service: Orthopedics;  Laterality: Right;  ? KNEE SURGERY    ? left knee   ? LAPAROSCOPIC SALPINGO OOPHERECTOMY Bilateral 10/02/2017  ? Procedure: LAPAROSCOPIC SALPINGO OOPHORECTOMY, CYSTOSCOPY;  Surgeon: Nunzio Cobbs, MD;  Location: [facility];  Service: Gynecology;  Laterality: Bilateral;  ? PARTIAL MASTECTOMY WITH NEEDLE LOCALIZATION Left 06/03/2013  ? Procedure: PARTIAL MASTECTOMY WITH NEEDLE LOCALIZATION;  Surgeon: [name], MD;  Location: [facility];  Service: General;  Laterality: Left;  ? POSTERIOR LUMBAR FUSION    ? RECTOCELE REPAIR N/A 09/27/2019  ? Procedure: POSTERIOR REPAIR (RECTOCELE)and  Enterocele;  Surgeon: Nunzio Cobbs, MD;  [facility];  Service: Gynecology;  Laterality: N/A;  ? SINUS IRRIGATION    ? TONSILLECTOMY    ? TRIGGER FINGER RELEASE    ? VENTRAL HERNIA REPAIR N/A 10/02/2017  ? Procedure: LAPAROSCOPIC VENTRAL WALL ASSISTED  HERNIA REPAIR WITH MESH;  Surgeon: Michael Boston, MD;  Location: [facility];  Service: General;  Laterality: N/A;  ? WISDOM TOOTH EXTRACTION    ? ? ?Current Outpatient Medications  ?Medication Sig Dispense Refill  ? allopurinol (ZYLOPRIM) 100 MG tablet Take 2 tablets by mouth daily.    ? calcium-vitamin D (OSCAL WITH D) 500-200 MG-UNIT per tablet Take 1 tablet by mouth daily with breakfast.     ? Evolocumab (REPATHA SURECLICK) 329 MG/ML SOAJ Inject 1 pen into the skin every 14 (fourteen) days. 2 mL 11  ? fenofibrate (TRICOR) 145 MG tablet Take 1 tablet (145 mg total) by mouth daily. 90 tablet 3  ? fexofenadine (ALLEGRA) 180 MG tablet Take 180 mg by mouth every evening.    ? fish oil-omega-3 fatty acids 1000 MG capsule Take 1 g by mouth 2 (two) times daily.     ? fluticasone (FLONASE) 50 MCG/ACT nasal spray as needed.    ? hydrochlorothiazide (HYDRODIURIL) 25 MG tablet TAKE ONE TABLET BY MOUTH ONCE DAILY. 90 tablet 0  ? losartan (COZAAR) 25 MG tablet  SMARTSIG:unspecified By Mouth    ? meclizine (ANTIVERT) 25 MG tablet Take 25 mg by mouth 3 (three) times daily as needed for dizziness.     ? meloxicam (MOBIC) 15 MG tablet Take 15 mg by mouth daily.    ? metFORMIN (GLUCOPHAGE) 500 MG tablet Take 500 mg by mouth every evening.     ? methylcellulose oral powder Take 1 packet by mouth as needed.     ? mometasone (NASONEX) 50 MCG/ACT nasal spray Place 2 sprays into the nose at bedtime.     ? nebivolol (BYSTOLIC) 10 MG tablet Take  1 tablet (10 mg total) by mouth every morning. 90 tablet 3  ? omeprazole (PRILOSEC) 20 MG capsule Take 20 mg by mouth daily as needed (for acid reflux).     ? Semaglutide, 2 MG/DOSE, (OZEMPIC, 2 MG/DOSE,) 8 MG/3ML SOPN Inject 2 mg into the skin once a week. 9 mL 3  ? ?No current facility-administered medications for this visit.  ? ? ?Family History  ?Problem Relation Age of Onset  ? Heart disease Mother   ? Deep vein thrombosis Mother   ? Colon cancer Mother   ? Rectal cancer Mother 54  ? Lung disease Mother   ? Colon polyps Mother   ? Heart disease Father   ? COPD Father   ? Rheum arthritis Father   ? Osteoarthritis Father   ? Colon polyps Father   ? Heart disease Brother   ? Pulmonary embolism Brother   ? Rheum arthritis Daughter   ? Esophageal cancer Neg Hx   ? Stomach cancer Neg Hx   ? ? ?Review of Systems  ?All other systems reviewed and are negative. ? ?Exam:   ?BP 122/80   Pulse 74   Ht 5' 4.5" (1.638 m)   Wt 220 lb (99.8 kg)   LMP 05/26/1993 (Exact Date)   SpO2 99%   BMI 37.18 kg/m?     ?General appearance: alert, cooperative and appears stated age ?Head: normocephalic, without obvious abnormality, atraumatic ?Neck: no adenopathy, supple, symmetrical, trachea midline and thyroid normal to inspection and palpation ?Lungs: clear to auscultation bilaterally ?Breasts: normal appearance, no masses or tenderness, No nipple retraction or dimpling, No nipple discharge or bleeding, No axillary adenopathy ?Heart: regular rate and  rhythm ?Abdomen: soft, non-tender; no masses, no organomegaly ?Extremities: extremities normal, atraumatic, no cyanosis or edema ?Skin: skin color, texture, turgor normal. No rashes or lesions ?Lymph nodes: cervical, sup

## 2021-09-18 ENCOUNTER — Encounter: Payer: Self-pay | Admitting: Obstetrics and Gynecology

## 2021-09-18 ENCOUNTER — Ambulatory Visit (INDEPENDENT_AMBULATORY_CARE_PROVIDER_SITE_OTHER): Payer: Medicare PPO | Admitting: Obstetrics and Gynecology

## 2021-09-18 VITALS — BP 122/80 | HR 74 | Ht 64.5 in | Wt 220.0 lb

## 2021-09-18 DIAGNOSIS — Z719 Counseling, unspecified: Secondary | ICD-10-CM | POA: Diagnosis not present

## 2021-09-18 DIAGNOSIS — K5900 Constipation, unspecified: Secondary | ICD-10-CM

## 2021-09-18 DIAGNOSIS — R202 Paresthesia of skin: Secondary | ICD-10-CM

## 2021-09-18 DIAGNOSIS — Z01419 Encounter for gynecological examination (general) (routine) without abnormal findings: Secondary | ICD-10-CM | POA: Diagnosis not present

## 2021-09-18 NOTE — Patient Instructions (Addendum)
EXERCISE AND DIET:  We recommended that you start or continue a regular exercise program for good health. Regular exercise means any activity that makes your heart beat faster and makes you sweat.  We recommend exercising at least 30 minutes per day at least 3 days a week, preferably 4 or 5.  We also recommend a diet low in fat and sugar.  Inactivity, poor dietary choices and obesity can cause diabetes, heart attack, stroke, and kidney damage, among others.   ? ?ALCOHOL AND SMOKING:  Women should limit their alcohol intake to no more than 7 drinks/beers/glasses of wine (combined, not each!) per week. Moderation of alcohol intake to this level decreases your risk of breast cancer and liver damage. And of course, no recreational drugs are part of a healthy lifestyle.  And absolutely no smoking or even second hand smoke. Most people know smoking can cause heart and lung diseases, but did you know it also contributes to weakening of your bones? Aging of your skin?  Yellowing of your teeth and nails? ? ?CALCIUM AND VITAMIN D:  Adequate intake of calcium and Vitamin D are recommended.  The recommendations for exact amounts of these supplements seem to change often, but generally speaking 600 mg of calcium (either carbonate or citrate) and 800 units of Vitamin D per day seems prudent. Certain women may benefit from higher intake of Vitamin D.  If you are among these women, your doctor will have told you during your visit.   ? ?PAP SMEARS:  Pap smears, to check for cervical cancer or precancers,  have traditionally been done yearly, although recent scientific advances have shown that most women can have pap smears less often.  However, every woman still should have a physical exam from her gynecologist every year. It will include a breast check, inspection of the vulva and vagina to check for abnormal growths or skin changes, a visual exam of the cervix, and then an exam to evaluate the size and shape of the uterus and  ovaries.  And after 66 years of age, a rectal exam is indicated to check for rectal cancers. We will also provide age appropriate advice regarding health maintenance, like when you should have certain vaccines, screening for sexually transmitted diseases, bone density testing, colonoscopy, mammograms, etc.  ? ?MAMMOGRAMS:  All women over 45 years old should have a yearly mammogram. Many facilities now offer a "3D" mammogram, which may cost around $50 extra out of pocket. If possible,  we recommend you accept the option to have the 3D mammogram performed.  It both reduces the number of women who will be called back for extra views which then turn out to be normal, and it is better than the routine mammogram at detecting truly abnormal areas.   ? ?COLONOSCOPY:  Colonoscopy to screen for colon cancer is recommended for all women at age 18.  We know, you hate the idea of the prep.  We agree, BUT, having colon cancer and not knowing it is worse!!  Colon cancer so often starts as a polyp that can be seen and removed at colonscopy, which can quite literally save your life!  And if your first colonoscopy is normal and you have no family history of colon cancer, most women don't have to have it again for 10 years.  Once every ten years, you can do something that may end up saving your life, right?  We will be happy to help you get it scheduled when you are ready.  Be sure to check your insurance coverage so you understand how much it will cost.  It may be covered as a preventative service at no cost, but you should check your particular policy.   ? ?Calcium Content in Foods ?Calcium is the most abundant mineral in the body. Most of the body's calcium supply is stored in bones and teeth. Calcium helps many parts of the body function normally, including: ?Blood and blood vessels. ?Nerves. ?Hormones. ?Muscles. ?Bones and teeth. ?When your calcium stores are low, you may be at risk for low bone mass, bone loss, and broken bones  (fractures). When you get enough calcium, it helps to support strong bones and teeth throughout your life. ?Calcium is especially important for: ?Children during growth spurts. ?Girls during adolescence. ?Women who are pregnant or breastfeeding. ?Women after their menstrual cycle stops (postmenopause). ?Women whose menstrual cycle has stopped due to anorexia nervosa or regular intense exercise. ?People who cannot eat or digest dairy products. ?Vegans. ?Recommended daily amounts of calcium: ?Women (ages 107 to 81): 1,000 mg per day. ?Women (ages 50 and older): 1,200 mg per day. ?Men (ages 80 to 88): 1,000 mg per day. ?Men (ages 86 and older): 1,200 mg per day. ?Women (ages 60 to 49): 1,300 mg per day. ?Men (ages 73 to 67): 1,300 mg per day. ?General information ?Eat foods that are high in calcium. Try to get most of your calcium from food. ?Some people may benefit from taking calcium supplements. Check with your health care provider or diet and nutrition specialist (dietitian) before starting any calcium supplements. Calcium supplements may interact with certain medicines. Too much calcium may cause other health problems, such as constipation and kidney stones. ?For the body to absorb calcium, it needs vitamin D. Sources of vitamin D include: ?Skin exposure to direct sunlight. ?Foods, such as egg yolks, liver, mushrooms, saltwater fish, and fortified milk. ?Vitamin D supplements. Check with your health care provider or dietitian before starting any vitamin D supplements. ?What foods are high in calcium? ? ?Foods that are high in calcium contain more than 100 milligrams per serving. ?Fruits ?Fortified orange juice or other fruit juice, 300 mg per 8 oz serving. ?Vegetables ?Collard greens, 360 mg per 8 oz serving. ?Kale, 100 mg per 8 oz serving. ?Bok choy, 160 mg per 8 oz serving. ?Grains ?Fortified ready-to-eat cereals, 100 to 1,000 mg per 8 oz serving. ?Fortified frozen waffles, 200 mg in 2 waffles. ?Oatmeal, 140 mg in  1 cup. ?Meats and other proteins ?Sardines, canned with bones, 325 mg per 3 oz serving. ?Salmon, canned with bones, 180 mg per 3 oz serving. ?Canned shrimp, 125 mg per 3 oz serving. ?Baked beans, 160 mg per 4 oz serving. ?Tofu, firm, made with calcium sulfate, 253 mg per 4 oz serving. ?Dairy ?Yogurt, plain, low-fat, 310 mg per 6 oz serving. ?Nonfat milk, 300 mg per 8 oz serving. ?American cheese, 195 mg per 1 oz serving. ?Cheddar cheese, 205 mg per 1 oz serving. ?Cottage cheese 2%, 105 mg per 4 oz serving. ?Fortified soy, rice, or almond milk, 300 mg per 8 oz serving. ?Mozzarella, part skim, 210 mg per 1 oz serving. ?The items listed above may not be a complete list of foods high in calcium. Actual amounts of calcium may be different depending on processing. Contact a dietitian for more information. ?What foods are lower in calcium? ?Foods that are lower in calcium contain 50 mg or less per serving. ?Fruits ?Apple, about 6 mg. ?Banana, about 12 mg. ?  Vegetables ?Lettuce, 19 mg per 2 oz serving. ?Tomato, about 11 mg. ?Grains ?Rice, 4 mg per 6 oz serving. ?Boiled potatoes, 14 mg per 8 oz serving. ?White bread, 6 mg per slice. ?Meats and other proteins ?Egg, 27 mg per 2 oz serving. ?Red meat, 7 mg per 4 oz serving. ?Chicken, 17 mg per 4 oz serving. ?Fish, cod, or trout, 20 mg per 4 oz serving. ?Dairy ?Cream cheese, regular, 14 mg per 1 Tbsp serving. ?Brie cheese, 50 mg per 1 oz serving. ?Parmesan cheese, 70 mg per 1 Tbsp serving. ?The items listed above may not be a complete list of foods lower in calcium. Actual amounts of calcium may be different depending on processing. Contact a dietitian for more information. ?Summary ?Calcium is an important mineral in the body because it affects many functions. Getting enough calcium helps support strong bones and teeth throughout your life. ?Try to get most of your calcium from food. ?Calcium supplements may interact with certain medicines. Check with your health care provider  or dietitian before starting any calcium supplements. ?This information is not intended to replace advice given to you by your health care provider. Make sure you discuss any questions you have with your h

## 2021-12-11 ENCOUNTER — Other Ambulatory Visit: Payer: Self-pay | Admitting: Cardiology

## 2021-12-17 ENCOUNTER — Ambulatory Visit: Payer: Medicare PPO | Admitting: Cardiology

## 2021-12-20 NOTE — Progress Notes (Addendum)
Cardiology Office Note:    Date:  12/23/2021   ID:  ANNEL ZUNKER, DOB [birth year], MRN [id number]  PCP:  Neale Burly, MD   [facility] Providers Cardiologist:  Ena Dawley, MD Sleep Medicine:  Fransico Him, MD  {   Referring MD: Neale Burly, MD    History of Present Illness:    Ann Fernandez is a 66 y.o. female with a hx of mild OSA, HTN, HLD, and obesity who was previously followed by Dr. Meda Coffee who now returns to clinic for follow-up.  Was last seen in clinic on 09/2020 where she was doing well from a CV standpoint but was trying to continue to lose weight. We started her on GLP-1 agonist.  Today, the patient states overall is doing well. She states the blood pressure is well controlled at home but is always elevated in the office due to white coat syndrome. No chest pain, SOB, orthopnea or PND. Occasional lightheadedness with position changes. No palpitations. Tolerating ozempic well without issues. Wt down from 241>216lbs.   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 Breast, lumpectomy, Left - ATYPICAL DUCTAL HYPERPLASIA, SEE COMMENT. - SURGICAL MARGINS, NEGATIVE FOR ATYPIA OR MALIGNANCY. - PREVIOUS BIOPSY SITE IDENTIFIED. - ONE LYMPH NODE, NEGATIVE FOR TUMOR (0/1), 2. Breast, excision, Left medial margin - BENIGN BREAST TISSUE, SEE COMMENT. - NEGATIVE FOR ATYPIA OR MALIGNANCY.    DDD (degenerative disc disease)    Diabetes mellitus without complication ([place])    DIVERTICULOSIS, COLON 12/21/2006   Qualifier: Diagnosis of  By: Leanne Chang MD, Bruce     DOE (dyspnea on exertion) 01/20/2014   Encounter for long-term (current) use of other medications 09/26/2011    Essential hypertension 12/21/2006     Pt states she has a white coat syndrome and her BP is OK at home.      Family history of adverse reaction to anesthesia    Anesthesia said they woke her mother up too fast and developed A-Fib .   GERD (gastroesophageal reflux disease)    Gout    Hearing loss    HYPERLIPIDEMIA 12/21/2006   Qualifier: Diagnosis of  By: Leanne Chang MD, Bruce     IBS (irritable bowel syndrome)    Incarcerated incisional hernia s/p lap repair w mesh 10/02/2017 10/02/2017   Obesity (BMI 30-39.9) 06/27/2014   OSA (obstructive sleep apnea) 03/24/2014   Mild OSA with AHI 12.4/hr now on CPAP at 10cm H2O.  Persistent oxygen desats <88% for 11 minutes on CPAP and now on nighttime O2 at 2L via CPAP    PONV (postoperative nausea and vomiting)    Position carefully due to [facility] back surgeries   Right ovarian cyst 09/12/2017   Wears glasses     Past Surgical History:  Procedure Laterality Date   ABDOMINAL HYSTERECTOMY  1994   TVH for DUB--ovaries retained     ANTERIOR LUMBAR FUSION     ANTERIOR LUMBAR FUSION  2018   [facility] Dr Jinny Sanders.  2 level ALIF at L4-5 and L5-S1   BACK SURGERY  2011   lumb cyst   BACK SURGERY  04/18/2016   Anterior and posterior spinal fusion  BREAST SURGERY     CERVICAL LAMINECTOMY     COLONOSCOPY  09/09/2012   CYST REMOVAL TRUNK     LOWER BACK   DILATION AND CURETTAGE OF UTERUS     INSERTION OF MESH N/A 10/02/2017   Procedure: INSERTION OF MESH;  Surgeon: Michael Boston, MD;  Location: [place];  Service: General;  Laterality: N/A;   JOINT REPLACEMENT     partial right knee   KNEE ARTHROSCOPY     KNEE ARTHROSCOPY Right 05/25/2013   Procedure: RIGHT ARTHROSCOPY KNEE, PARTIAL MEDIAL MENISCECTOMY, CHONDROPLASTY, PARTIAL LATERAL MENISCECTOMY;  Surgeon: Gearlean Alf, MD;  Location: [facility];  Service: Orthopedics;  Laterality: Right;   KNEE SURGERY     left knee    LAPAROSCOPIC SALPINGO OOPHERECTOMY Bilateral 10/02/2017   Procedure:  LAPAROSCOPIC SALPINGO OOPHORECTOMY, CYSTOSCOPY;  Surgeon: Nunzio Cobbs, MD;  Location: [place];  Service: Gynecology;  Laterality: Bilateral;   PARTIAL MASTECTOMY WITH NEEDLE LOCALIZATION Left 06/03/2013   Procedure: PARTIAL MASTECTOMY WITH NEEDLE LOCALIZATION;  Surgeon: [name], MD;  Location: [facility];  Service: General;  Laterality: Left;   POSTERIOR LUMBAR FUSION     RECTOCELE REPAIR N/A 09/27/2019   Procedure: POSTERIOR REPAIR (RECTOCELE)and  Enterocele;  Surgeon: Nunzio Cobbs, MD;  Location: [facility];  Service: Gynecology;  Laterality: N/A;   SINUS IRRIGATION     TONSILLECTOMY     TRIGGER FINGER RELEASE     VENTRAL HERNIA REPAIR N/A 10/02/2017   Procedure: LAPAROSCOPIC VENTRAL WALL ASSISTED  HERNIA REPAIR WITH MESH;  Surgeon: Michael Boston, MD;  Location: [place];  Service: General;  Laterality: N/A;   WISDOM TOOTH EXTRACTION      Current Medications: Current Meds  Medication Sig   allopurinol (ZYLOPRIM) 100 MG tablet Take 2 tablets by mouth daily.   calcium-vitamin D (OSCAL WITH D) 500-200 MG-UNIT per tablet Take 1 tablet by mouth daily with breakfast.    Evolocumab (REPATHA SURECLICK) 976 MG/ML SOAJ Inject 1 pen into the skin every 14 (fourteen) days.   fenofibrate (TRICOR) 145 MG tablet Take 1 tablet (145 mg total) by mouth daily.   fexofenadine (ALLEGRA) 180 MG tablet Take 180 mg by mouth every evening.   fish oil-omega-3 fatty acids 1000 MG capsule Take 1 g by mouth 2 (two) times daily.    fluticasone (FLONASE) 50 MCG/ACT nasal spray as needed.   hydrochlorothiazide (HYDRODIURIL) 25 MG tablet TAKE ONE TABLET BY MOUTH ONCE DAILY.   losartan (COZAAR) 25 MG tablet Take 25 mg by mouth daily.   meclizine (ANTIVERT) 25 MG tablet Take 25 mg by mouth 3 (three) times daily as needed for dizziness.    meloxicam (MOBIC) 15 MG tablet Take 15 mg by mouth daily.   metFORMIN (GLUCOPHAGE) 500  MG tablet Take 500 mg by mouth every evening.    methylcellulose oral powder Take 1 packet by mouth as needed.    mometasone (NASONEX) 50 MCG/ACT nasal spray Place 2 sprays into the nose at bedtime.    nebivolol (BYSTOLIC) 10 MG tablet TAKE (1) TABLET BY MOUTH ONCE DAILY.   omeprazole (PRILOSEC) 20 MG capsule Take 20 mg by mouth daily as needed (for acid reflux).    Semaglutide, 2 MG/DOSE, (OZEMPIC, 2 MG/DOSE,) 8 MG/3ML SOPN Inject 2 mg into the skin once a week.     Allergies:   Demerol [meperidine], Penicillins, Sulfa antibiotics, Sulfasalazine, Triamterene, Amlodipine besylate, Arimidex [anastrozole], Lipitor [atorvastatin],  Lisinopril, Propoxyphene, Simvastatin, and Topiramate   Social History   Socioeconomic History   Marital status: Married    Spouse name: Not on file   Number of children: Not on file   Years of education: Not on file   Highest education level: Not on file  Occupational History   Not on file  Tobacco Use   Smoking status: Never   Smokeless tobacco: Never  Vaping Use   Vaping Use: Never used  Substance and Sexual Activity   Alcohol use: No   Drug use: No   Sexual activity: Yes    Partners: Male    Comment: TVH  Other Topics Concern   Not on file  Social History Narrative   Not on file   Social Determinants of Health   Financial Resource Strain: Not on file  Food Insecurity: Not on file  Transportation Needs: Not on file  Physical Activity: Not on file  Stress: Not on file  Social Connections: Not on file     Family History: The patient's family history includes COPD in her father; Colon cancer in her mother; Colon polyps in her father and mother; Deep vein thrombosis in her mother; Heart disease in her brother, father, and mother; Lung disease in her mother; Osteoarthritis in her father; Pulmonary embolism in her brother; Rectal cancer (age of onset: 19) in her mother; Rheum arthritis in her daughter and father. There is no history of Esophageal  cancer or Stomach cancer.  ROS:   Please see the history of present illness.    Review of Systems  Constitutional:  Negative for chills and fever.  HENT:  Negative for hearing loss.   Eyes:  Negative for blurred vision.  Respiratory:  Negative for shortness of breath.   Cardiovascular:  Negative for chest pain, palpitations, orthopnea, claudication, leg swelling and PND.  Gastrointestinal:  Negative for melena, nausea and vomiting.  Genitourinary:  Negative for dysuria.  Musculoskeletal:  Positive for back pain and joint pain.  Neurological:  Negative for dizziness and loss of consciousness.  Endo/Heme/Allergies:  Negative for polydipsia.  Psychiatric/Behavioral:  Negative for substance abuse.     EKGs/Labs/Other Studies Reviewed:    The following studies were reviewed today: TTE January 05, 2016: Study Conclusions  - Left ventricle: The cavity size was normal. Systolic function was    normal. The estimated ejection fraction was in the range of 55%    to 60%. Wall motion was normal; there were no regional wall    motion abnormalities. Features are consistent with a pseudonormal    left ventricular filling pattern, with concomitant abnormal    relaxation and increased filling pressure (grade 2 diastolic    dysfunction). Doppler parameters are consistent with high    ventricular filling pressure.  - Aortic valve: Mildly calcified annulus. Trileaflet; normal    thickness leaflets.  - Mitral valve: Calcified annulus. There was trivial regurgitation.  EKG:  EKG is  ordered today.  The ekg ordered today demonstrates NSR with HR 70  Recent Labs: 12/31/2020: ALT 35  Recent Lipid Panel    Component Value Date/Time   CHOL 152 04/04/2021 0856   TRIG 114 04/04/2021 0856   HDL 45 04/04/2021 0856   CHOLHDL 3.4 04/04/2021 0856   CHOLHDL 4.4 10/12/2015 1114   VLDL 44 (H) 10/12/2015 1114   LDLCALC 86 04/04/2021 0856   LDLDIRECT 172.8 06/29/2012 1003      Physical Exam:    VS:  BP (!)  160/80   Pulse 70  Ht '5\' 5"'$  (1.651 m)   Wt 216 lb (98 kg)   LMP 05/26/1993 (Exact Date)   BMI 35.94 kg/m     Repeat BP: 127/80  Wt Readings from Last 3 Encounters:  12/23/21 216 lb (98 kg)  09/18/21 220 lb (99.8 kg)  04/04/21 221 lb (100.2 kg)     GEN:  Well nourished, well developed in no acute distress HEENT: Normal NECK: No JVD; No carotid bruits CARDIAC: RRR, 1/6 systolic murmur RUSB RESPIRATORY:  Clear to auscultation without rales, wheezing or rhonchi  ABDOMEN: Soft, non-tender, non-distended MUSCULOSKELETAL:  No edema; No deformity  SKIN: Warm and dry NEUROLOGIC:  Alert and oriented x 3 PSYCHIATRIC:  Normal affect   ASSESSMENT:    1. Essential hypertension   2. Other hyperlipidemia   3. Family history of early CAD   35. Pure hypercholesterolemia   5. Prediabetes   6. Primary hypertension   7. Statin intolerance   8. Class 2 obesity with body mass index (BMI) of 36.0 to 36.9 in adult, unspecified obesity type, unspecified whether serious comorbidity present    PLAN:    In order of problems listed above:  #Obesity: BMI36. Doing well with ozempic and diet. Wt down from 241lbs to 216lbs.  -Continue lifestyle modifications -Continue ozempic '2mg'$  weekly  #OSA: Followed by Dr. Radford Pax. Has been using and benefiting from her CPAP device.  -Continue CPAP  #HTN: Well controlled at home. Initial BP elevated but normal on re-check. -Continue HCTZ '25mg'$  daily -Continue bystolic '10mg'$  daily -Continue losartan '25mg'$  daily  #HLD: #Statin Intolerance: LDL 38 in 06/2021. Had significant cramping with crestor and has failed lipitor and simva in the past. Now transitioned to repatha. Has very strong family history of CAD. -Check Ca score -Continue repatha '140mg'$  q2weeks -Continue fenofibrates '145mg'$  daily   Medication Adjustments/Labs and Tests Ordered: Current medicines are reviewed at length with the patient today.  Concerns regarding medicines are outlined above.   Orders Placed This Encounter  Procedures   CT CARDIAC SCORING (SELF PAY ONLY)   EKG 12-Lead   No orders of the defined types were placed in this encounter.   Patient Instructions  Medication Instructions:   Your physician recommends that you continue on your current medications as directed. Please refer to the Current Medication list given to you today.   *If you need a refill on your cardiac medications before your next appointment, please call your pharmacy*   Testing/Procedures:  CARDIAC CALCIUM SCORE (SELF PAY)   Follow-Up: At [facility], you and your health needs are our priority.  As part of our continuing mission to provide you with exceptional heart care, we have created designated Provider Care Teams.  These Care Teams include your primary Cardiologist (physician) and Advanced Practice Providers (APPs -  Physician Assistants and Nurse Practitioners) who all work together to provide you with the care you need, when you need it.  We recommend signing up for the patient portal called "MyChart".  Sign up information is provided on this After Visit Summary.  MyChart is used to connect with patients for Virtual Visits (Telemedicine).  Patients are able to view lab/test results, encounter notes, upcoming appointments, etc.  Non-urgent messages can be sent to your provider as well.   To learn more about what you can do with MyChart, go to [email or web address].    Your next appointment:   1 year(s)  The format for your next appointment:   In Person  Provider:   DR. Johney Frame  Important Information About Sugar         Signed, Freada Bergeron, MD  12/23/2021 9:10 AM    [place]

## 2021-12-23 ENCOUNTER — Ambulatory Visit (INDEPENDENT_AMBULATORY_CARE_PROVIDER_SITE_OTHER): Payer: Medicare PPO | Admitting: Cardiology

## 2021-12-23 ENCOUNTER — Encounter: Payer: Self-pay | Admitting: Cardiology

## 2021-12-23 VITALS — BP 160/80 | HR 70 | Ht 65.0 in | Wt 216.0 lb

## 2021-12-23 DIAGNOSIS — E669 Obesity, unspecified: Secondary | ICD-10-CM

## 2021-12-23 DIAGNOSIS — Z6836 Body mass index (BMI) 36.0-36.9, adult: Secondary | ICD-10-CM | POA: Diagnosis not present

## 2021-12-23 DIAGNOSIS — Z8249 Family history of ischemic heart disease and other diseases of the circulatory system: Secondary | ICD-10-CM | POA: Diagnosis not present

## 2021-12-23 DIAGNOSIS — E7849 Other hyperlipidemia: Secondary | ICD-10-CM

## 2021-12-23 DIAGNOSIS — R7303 Prediabetes: Secondary | ICD-10-CM | POA: Diagnosis not present

## 2021-12-23 DIAGNOSIS — Z789 Other specified health status: Secondary | ICD-10-CM | POA: Diagnosis not present

## 2021-12-23 DIAGNOSIS — I10 Essential (primary) hypertension: Secondary | ICD-10-CM

## 2021-12-23 DIAGNOSIS — E7800 Pure hypercholesterolemia, unspecified: Secondary | ICD-10-CM

## 2021-12-23 DIAGNOSIS — E66812 Obesity, class 2: Secondary | ICD-10-CM

## 2021-12-23 NOTE — Patient Instructions (Signed)
Medication Instructions:   Your physician recommends that you continue on your current medications as directed. Please refer to the Current Medication list given to you today.   *If you need a refill on your cardiac medications before your next appointment, please call your pharmacy*   Testing/Procedures:  CARDIAC CALCIUM SCORE (SELF PAY)   Follow-Up: At [facility], you and your health needs are our priority.  As part of our continuing mission to provide you with exceptional heart care, we have created designated Provider Care Teams.  These Care Teams include your primary Cardiologist (physician) and Advanced Practice Providers (APPs -  Physician Assistants and Nurse Practitioners) who all work together to provide you with the care you need, when you need it.  We recommend signing up for the patient portal called "MyChart".  Sign up information is provided on this After Visit Summary.  MyChart is used to connect with patients for Virtual Visits (Telemedicine).  Patients are able to view lab/test results, encounter notes, upcoming appointments, etc.  Non-urgent messages can be sent to your provider as well.   To learn more about what you can do with MyChart, go to [email or web address].    Your next appointment:   1 year(s)  The format for your next appointment:   In Person  Provider:   DR. Johney Frame   Important Information About Sugar

## 2021-12-24 ENCOUNTER — Telehealth: Payer: Self-pay | Admitting: Pharmacist

## 2021-12-24 MED ORDER — SEMAGLUTIDE (1 MG/DOSE) 4 MG/3ML ~~LOC~~ SOPN: 1.0000 mg | PEN_INJECTOR | SUBCUTANEOUS | 11 refills | Status: DC

## 2021-12-24 NOTE — Telephone Encounter (Signed)
Pharmacy cant get '2mg'$  Ozempic. Will decrease down to '1mg'$  weekly for now until the '2mg'$  is available again. Rx sent.

## 2021-12-30 ENCOUNTER — Other Ambulatory Visit: Payer: Self-pay | Admitting: Cardiology

## 2022-01-02 DIAGNOSIS — J301 Allergic rhinitis due to pollen: Secondary | ICD-10-CM | POA: Diagnosis not present

## 2022-01-02 DIAGNOSIS — Z6836 Body mass index (BMI) 36.0-36.9, adult: Secondary | ICD-10-CM | POA: Diagnosis not present

## 2022-01-02 DIAGNOSIS — I10 Essential (primary) hypertension: Secondary | ICD-10-CM | POA: Diagnosis not present

## 2022-01-02 DIAGNOSIS — E119 Type 2 diabetes mellitus without complications: Secondary | ICD-10-CM | POA: Diagnosis not present

## 2022-01-02 DIAGNOSIS — Z0000 Encounter for general adult medical examination without abnormal findings: Secondary | ICD-10-CM | POA: Diagnosis not present

## 2022-01-02 DIAGNOSIS — E785 Hyperlipidemia, unspecified: Secondary | ICD-10-CM | POA: Diagnosis not present

## 2022-01-06 ENCOUNTER — Telehealth: Payer: Self-pay | Admitting: *Deleted

## 2022-01-06 DIAGNOSIS — I10 Essential (primary) hypertension: Secondary | ICD-10-CM

## 2022-01-06 DIAGNOSIS — G4733 Obstructive sleep apnea (adult) (pediatric): Secondary | ICD-10-CM

## 2022-01-06 NOTE — Telephone Encounter (Signed)
Just addended!

## 2022-01-06 NOTE — Telephone Encounter (Signed)
Patient called in stating her cpap motor life has exceeded and she wants a new cpap her setting are, Mode CPAP Set pressure 8 cmH2O EPR Fulltime EPR level 3

## 2022-01-06 NOTE — Telephone Encounter (Signed)
Hello Dr Johney Frame, would you addend your 12/23/21 note on this patient to say she was using and benefiting from her cpap when you saw her so we can send that recent note to get her a new machine. She can't see Dr Radford Pax until 8/31. She is having some sinus problems without using her cpap.

## 2022-01-13 ENCOUNTER — Ambulatory Visit (HOSPITAL_BASED_OUTPATIENT_CLINIC_OR_DEPARTMENT_OTHER)
Admission: RE | Admit: 2022-01-13 | Discharge: 2022-01-13 | Disposition: A | Discharge status: Home / Self Care | Payer: Medicare PPO | Source: Ambulatory Visit | Attending: Cardiology | Admitting: Cardiology

## 2022-01-13 DIAGNOSIS — E7849 Other hyperlipidemia: Secondary | ICD-10-CM | POA: Insufficient documentation

## 2022-01-13 DIAGNOSIS — Z8249 Family history of ischemic heart disease and other diseases of the circulatory system: Secondary | ICD-10-CM | POA: Insufficient documentation

## 2022-01-14 ENCOUNTER — Telehealth: Payer: Self-pay | Admitting: *Deleted

## 2022-01-14 NOTE — Telephone Encounter (Signed)
Pt had Cardiac Calcium Score done and Dr. Gasper Sells (covering for Dr. Johney Frame) advised below via secure on pts results:    I can't remember if I resulted this or not; I don't see an LDL from this year, while on Repatha.  Given her FHX and CAC would get so we can see if she needs other interventions (I.e. zetia)   OK; she has an elevated Calcium score; would recommend getting fasting lipids prior to lipid clinc to see if she needs additional therapy   The patient has been notified of the result and verbalized understanding.  All questions (if any) were answered.  Advised the pt that we need to get her in for repeat lipids sometime soon, before making further adjustments to her cholesterol regimen.  Pt states she just had labs done by her PCP about a week or 2 ago, and they did check her lipids.  Pt aware that I will reach out to PCP and have them fax those to [place] at [phone].  Pt aware once I receive those results, I will have them scanned to Dr. Jacolyn Reedy in-basket to further review and advise on.  She is aware that I will call her accordingly thereafter, with those recommendations. Pt verbalized understanding and agrees with this plan.  Spoke with the pts PCP and they will be faxing those results to [phone] here shortly. Will route this message to Dr. Jacolyn Reedy in-basket, so she has for reference when viewing scanned labs.

## 2022-01-17 NOTE — Telephone Encounter (Signed)
Per Dr Radford Pax, Order ResMed CPAP at 8cm H2O with heated humidity   Order placed to [employer] [facility] via community message.

## 2022-01-17 NOTE — Addendum Note (Signed)
Addended by: Freada Bergeron on: 01/17/2022 09:04 AM   Modules accepted: Orders

## 2022-01-21 DIAGNOSIS — M159 Polyosteoarthritis, unspecified: Secondary | ICD-10-CM | POA: Diagnosis not present

## 2022-01-21 DIAGNOSIS — M154 Erosive (osteo)arthritis: Secondary | ICD-10-CM | POA: Diagnosis not present

## 2022-01-21 DIAGNOSIS — E790 Hyperuricemia without signs of inflammatory arthritis and tophaceous disease: Secondary | ICD-10-CM | POA: Diagnosis not present

## 2022-01-21 DIAGNOSIS — Z5181 Encounter for therapeutic drug level monitoring: Secondary | ICD-10-CM | POA: Diagnosis not present

## 2022-01-23 ENCOUNTER — Ambulatory Visit: Payer: Medicare PPO | Attending: Cardiology | Admitting: Cardiology

## 2022-01-23 ENCOUNTER — Encounter: Payer: Self-pay | Admitting: Cardiology

## 2022-01-23 VITALS — BP 170/90 | HR 81 | Ht 65.0 in | Wt 219.4 lb

## 2022-01-23 DIAGNOSIS — Z9989 Dependence on other enabling machines and devices: Secondary | ICD-10-CM | POA: Diagnosis not present

## 2022-01-23 DIAGNOSIS — I10 Essential (primary) hypertension: Secondary | ICD-10-CM | POA: Diagnosis not present

## 2022-01-23 DIAGNOSIS — G4733 Obstructive sleep apnea (adult) (pediatric): Secondary | ICD-10-CM

## 2022-01-23 NOTE — Patient Instructions (Addendum)
Dr. Radford Pax has ordered a 24 hour blood pressure monitor for you to wear. Our monitor team will be in contact with you to arrange when one becomes available.   Medication Instructions:  Your physician recommends that you continue on your current medications as directed. Please refer to the Current Medication list given to you today.  *If you need a refill on your cardiac medications before your next appointment, please call your pharmacy*  Follow-Up: At [facility], you and your health needs are our priority.  As part of our continuing mission to provide you with exceptional heart care, we have created designated Provider Care Teams.  These Care Teams include your primary Cardiologist (physician) and Advanced Practice Providers (APPs -  Physician Assistants and Nurse Practitioners) who all work together to provide you with the care you need, when you need it.  We will be in touch with your for your sleep compliance appointment.   Important Information About Sugar

## 2022-01-23 NOTE — Progress Notes (Signed)
Cardiology Office Note:    Date:  01/23/2022   ID:  Ann Fernandez, DOB 1955-10-15, MRN [id number]  PCP:  Neale Burly, MD  Cardiologist:  Gwyndolyn Kaufman, MD   Referring MD: Neale Burly, MD   Reason for visit: OSA and HTN  History of Present Illness:    Ann Fernandez is a 66 y.o. female with a hx of mild OSA , hypertension, hyperlipidemia and obesity who is on CPAP.  She was doing well with her device but then she started having issues with the airflow and a lot of moisture on her face waking her up.  She is supposed to pick up a new machine today.  She tolerates the mask except it sometimes makes her face hurt but she is going to call the DME.   Since going on PAP she feels rested in the am and has no significant daytime sleepiness.  She has had some issues with sinus congestion as well as dry mouth and nose since her machine started having issues with the moisture control. Her machine also has exceeded its limit for power.  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 Breast, lumpectomy, Left - ATYPICAL DUCTAL HYPERPLASIA, SEE COMMENT. - SURGICAL MARGINS, NEGATIVE FOR ATYPIA OR MALIGNANCY. - PREVIOUS BIOPSY SITE IDENTIFIED. - ONE LYMPH NODE, NEGATIVE FOR TUMOR (0/1), 2. Breast, excision, Left medial margin - BENIGN BREAST TISSUE, SEE COMMENT. - NEGATIVE FOR ATYPIA OR MALIGNANCY.    DDD (degenerative disc disease)    Diabetes mellitus without complication ([place])    DIVERTICULOSIS, COLON 12/21/2006   Qualifier: Diagnosis of  By: Leanne Chang MD, Bruce     DOE (dyspnea on exertion) 01/20/2014   Encounter for long-term (current) use of other medications 09/26/2011   Essential hypertension 12/21/2006     Pt states she has a white coat syndrome and her BP is OK at  home.      Family history of adverse reaction to anesthesia    Anesthesia said they woke her mother up too fast and developed A-Fib .   GERD (gastroesophageal reflux disease)    Gout    Hearing loss    HYPERLIPIDEMIA 12/21/2006   Qualifier: Diagnosis of  By: Leanne Chang MD, Bruce     IBS (irritable bowel syndrome)    Incarcerated incisional hernia s/p lap repair w mesh 10/02/2017 10/02/2017   Obesity (BMI 30-39.9) 06/27/2014   OSA (obstructive sleep apnea) 03/24/2014   Mild OSA with AHI 12.4/hr now on CPAP at 10cm H2O.  Persistent oxygen desats <88% for 11 minutes on CPAP and now on nighttime O2 at 2L via CPAP    PONV (postoperative nausea and vomiting)    Position carefully due to [facility] back surgeries   Right ovarian cyst 09/12/2017   Wears glasses     Past Surgical History:  Procedure Laterality Date   ABDOMINAL HYSTERECTOMY  1994   [facility]H for DUB--ovaries retained     ANTERIOR LUMBAR FUSION     ANTERIOR LUMBAR FUSION  2018   [facility] Dr Jinny Sanders.  2 level ALIF at L4-5 and L5-S1   BACK SURGERY  2011   lumb cyst   BACK SURGERY  04/18/2016   Anterior and posterior spinal fusion   BREAST SURGERY  CERVICAL LAMINECTOMY     COLONOSCOPY  09/09/2012   CYST REMOVAL TRUNK     LOWER BACK   DILATION AND CURETTAGE OF UTERUS     INSERTION OF MESH N/A 10/02/2017   Procedure: INSERTION OF MESH;  Surgeon: Michael Boston, MD;  Location: [place];  Service: General;  Laterality: N/A;   JOINT REPLACEMENT     partial right knee   KNEE ARTHROSCOPY     KNEE ARTHROSCOPY Right 05/25/2013   Procedure: RIGHT ARTHROSCOPY KNEE, PARTIAL MEDIAL MENISCECTOMY, CHONDROPLASTY, PARTIAL LATERAL MENISCECTOMY;  Surgeon: Gearlean Alf, MD;  Location: [facility];  Service: Orthopedics;  Laterality: Right;   KNEE SURGERY     left knee    LAPAROSCOPIC SALPINGO OOPHERECTOMY Bilateral 10/02/2017   Procedure: LAPAROSCOPIC SALPINGO OOPHORECTOMY, CYSTOSCOPY;  Surgeon: Nunzio Cobbs, MD;  Location: [place];  Service: Gynecology;  Laterality: Bilateral;   PARTIAL MASTECTOMY WITH NEEDLE LOCALIZATION Left 06/03/2013   Procedure: PARTIAL MASTECTOMY WITH NEEDLE LOCALIZATION;  Surgeon: [name], MD;  Location: [place];  Service: General;  Laterality: Left;   POSTERIOR LUMBAR FUSION     RECTOCELE REPAIR N/A 09/27/2019   Procedure: POSTERIOR REPAIR (RECTOCELE)and  Enterocele;  Surgeon: Nunzio Cobbs, MD;  Location[facility];  Service: Gynecology;  Laterality: N/A;   SINUS IRRIGATION     TONSILLECTOMY     TRIGGER FINGER RELEASE     VENTRAL HERNIA REPAIR N/A 10/02/2017   Procedure: LAPAROSCOPIC VENTRAL WALL ASSISTED  HERNIA REPAIR WITH MESH;  Surgeon: Michael Boston, MD;  Location: [place];  Service: General;  Laterality: N/A;   WISDOM TOOTH EXTRACTION      Current Medications: Current Meds  Medication Sig   allopurinol (ZYLOPRIM) 100 MG tablet Take 2 tablets by mouth daily.   calcium-vitamin D (OSCAL WITH D) 500-200 MG-UNIT per tablet Take 1 tablet by mouth daily with breakfast.    Evolocumab (REPATHA SURECLICK) 174 MG/ML SOAJ Inject 1 pen into the skin every 14 (fourteen) days.   fenofibrate (TRICOR) 145 MG tablet Take 1 tablet (145 mg total) by mouth daily.   fexofenadine (ALLEGRA) 180 MG tablet Take 180 mg by mouth every evening.   fish oil-omega-3 fatty acids 1000 MG capsule Take 1 g by mouth 2 (two) times daily.    fluticasone (FLONASE) 50 MCG/ACT nasal spray as needed.   hydrochlorothiazide (HYDRODIURIL) 25 MG tablet TAKE ONE TABLET BY MOUTH ONCE DAILY.   losartan (COZAAR) 50 MG tablet Take 50 mg by mouth daily.   meclizine (ANTIVERT) 25 MG tablet Take 25 mg by mouth 3 (three) times daily as needed for dizziness.    meloxicam (MOBIC) 15 MG tablet Take 15 mg by mouth daily.   metFORMIN (GLUCOPHAGE) 500 MG tablet Take 500 mg by mouth every evening.    methylcellulose oral powder Take 1 packet by mouth as  needed.    mometasone (NASONEX) 50 MCG/ACT nasal spray Place 2 sprays into the nose at bedtime.    nebivolol (BYSTOLIC) 10 MG tablet TAKE (1) TABLET BY MOUTH ONCE DAILY.   omeprazole (PRILOSEC) 20 MG capsule Take 20 mg by mouth daily as needed (for acid reflux).    Semaglutide, 1 MG/DOSE, 4 MG/3ML SOPN Inject 1 mg into the skin once a week.     Allergies:   Demerol [meperidine], Penicillins, Sulfa antibiotics, Sulfasalazine, Triamterene, Amlodipine besylate, Arimidex [anastrozole], Lipitor [atorvastatin], Lisinopril, Propoxyphene, Simvastatin, and Topiramate   Social History  Socioeconomic History   Marital status: Married    Spouse name: Not on file   Number of children: Not on file   Years of education: Not on file   Highest education level: Not on file  Occupational History   Not on file  Tobacco Use   Smoking status: Never   Smokeless tobacco: Never  Vaping Use   Vaping Use: Never used  Substance and Sexual Activity   Alcohol use: No   Drug use: No   Sexual activity: Yes    Partners: Male    Comment: TVH  Other Topics Concern   Not on file  Social History Narrative   Not on file   Social Determinants of Health   Financial Resource Strain: Not on file  Food Insecurity: Not on file  Transportation Needs: Not on file  Physical Activity: Not on file  Stress: Not on file  Social Connections: Not on file     Family History: The patient's family history includes COPD in her father; Colon cancer in her mother; Colon polyps in her father and mother; Deep vein thrombosis in her mother; Heart disease in her brother, father, and mother; Lung disease in her mother; Osteoarthritis in her father; Pulmonary embolism in her brother; Rectal cancer (age of onset: 65) in her mother; Rheum arthritis in her daughter and father. There is no history of Esophageal cancer or Stomach cancer.  ROS:   Please see the history of present illness.    ROS  All other systems reviewed and  negative.   EKGs/Labs/Other Studies Reviewed:    The following studies were reviewed today: PAP download   EKG:  EKG is not ordered today.    Recent Labs: No results found for requested labs within last 365 days.   Recent Lipid Panel    Component Value Date/Time   CHOL 152 04/04/2021 0856   TRIG 114 04/04/2021 0856   HDL 45 04/04/2021 0856   CHOLHDL 3.4 04/04/2021 0856   CHOLHDL 4.4 10/12/2015 1114   VLDL 44 (H) 10/12/2015 1114   LDLCALC 86 04/04/2021 0856   LDLDIRECT 172.8 06/29/2012 1003   HYPERTENSION CONTROL Vitals:   01/23/22 1010 01/23/22 1041  BP: (!) 158/74 (!) 170/90    The patient's blood pressure is elevated above target today.  In order to address the patient's elevated BP: Blood pressure will be monitored at home to determine if medication changes need to be made.; The blood pressure is usually elevated in clinic.  Blood pressures monitored at home have been optimal.      Physical Exam:    VS:  BP (!) 170/90   Pulse 81   Ht '5\' 5"'$  (1.651 m)   Wt 219 lb 6.4 oz (99.5 kg)   LMP 05/26/1993 (Exact Date)   SpO2 98%   BMI 36.51 kg/m     Wt Readings from Last 3 Encounters:  01/23/22 219 lb 6.4 oz (99.5 kg)  12/23/21 216 lb (98 kg)  09/18/21 220 lb (99.8 kg)     GEN: Well nourished, well developed in no acute distress HEENT: Normal NECK: No JVD; No carotid bruits LYMPHATICS: No lymphadenopathy CARDIAC:RRR, no murmurs, rubs, gallops RESPIRATORY:  Clear to auscultation without rales, wheezing or rhonchi  ABDOMEN: Soft, non-tender, non-distended MUSCULOSKELETAL:  No edema; No deformity  SKIN: Warm and dry NEUROLOGIC:  Alert and oriented x 3 PSYCHIATRIC:  Normal affect   ASSESSMENT:    1. OSA on CPAP   2. Essential hypertension  PLAN:    In order of problems listed above:  OSA - The patient is tolerating PAP therapy well without any problems. The PAP download performed by his DME was personally reviewed and interpreted by me today and  showed an AHI of 1/hr on 8 cm H2O with 100% compliance in using more than 4 hours nightly.  The patient has been using and benefiting from PAP use and will continue to benefit from therapy.   HTN -BP is poorly ontrolled on exam today but at home is usually 130/85mHg and has documented white coat HTN -Continue Prescription drug management with HCTZ 25 mg daily, losartan 25 mg daily, Bystolic 10 mg daily with as needed refills -I have personally reviewed and interpreted outside labs performed by patient's PCP which showed serum creatinine 0.73 and potassium 4.3 on 10/18/2020 -I will get a 48 hour BP monitor to assess BP control   Follow up in 1 year.  Medication Adjustments/Labs and Tests Ordered: Current medicines are reviewed at length with the patient today.  Concerns regarding medicines are outlined above.  No orders of the defined types were placed in this encounter.  No orders of the defined types were placed in this encounter.  Signed, TFransico Him MD  01/23/2022 10:48 AM    [place]

## 2022-01-23 NOTE — Addendum Note (Signed)
Addended by: Antonieta Iba on: 01/23/2022 10:51 AM   Modules accepted: Orders

## 2022-01-23 NOTE — Progress Notes (Signed)
Will check a 48 hour BP monitor

## 2022-01-24 ENCOUNTER — Other Ambulatory Visit: Payer: Self-pay | Admitting: Pharmacist

## 2022-01-24 MED ORDER — REPATHA SURECLICK 140 MG/ML ~~LOC~~ SOAJ: 1.0000 "pen " | SUBCUTANEOUS | 11 refills | Status: DC

## 2022-01-30 ENCOUNTER — Telehealth: Payer: Self-pay | Admitting: Cardiology

## 2022-01-30 NOTE — Telephone Encounter (Signed)
Pt calling in regarding to test results. Pt states that she was told to have her labs from PCP sent over and has not heard anything back as to what the next steps are. Please advise

## 2022-01-30 NOTE — Telephone Encounter (Signed)
Pt aware that Dr. Johney Frame reviewed both results and her cholesterol looks great.  Advised the pt to proceed with her current regimen, and the repatha is working great.  Pt verbalized understanding and agrees with this plan.

## 2022-01-30 NOTE — Telephone Encounter (Signed)
Her cholesterol looks excellent. The repatha is working great. No changes.

## 2022-01-30 NOTE — Telephone Encounter (Signed)
Dr. Johney Frame and Pharmacy, the pt had labs done by her PCP recently and they are in epic as scanned. She also had a calcium score done by our office recently.  She is on repatha and is followed by our lipid clinic.    While Dr. Johney Frame was on vacation, Dr. Gasper Sells was covering the inbox and resulted her calcium score results (see telephone encounter from 8/22 about this). He didn't want to make any med changes at that time, until she had a recent lipid panel done. Pt did have this done by her PCP and results were sent to our office and scanned into her chart.  Can you please advise on those lab results, calcium score, and further lipid management?  See telephone call from 8/22 for reference.   Thanks all!

## 2022-01-31 NOTE — Telephone Encounter (Signed)
Kymber, Kosar F - 01/30/2022  4:02 PM Ramond Dial, RPH-CPP  Sent: Fri January 31, 2022  7:02 AM  To: Nuala Alpha, LPN          Message  Fantastic labs. Thanks for calling her

## 2022-02-25 ENCOUNTER — Telehealth: Payer: Self-pay | Admitting: Cardiology

## 2022-02-25 NOTE — Telephone Encounter (Signed)
Patient called stating Dr. Radford Pax wanted her to wear a 24hr blood pressure monitor.  She was told someone would give her a call, she states she hasn't heard anything about it.

## 2022-02-27 NOTE — Telephone Encounter (Signed)
Appointment scheduled 03/17/22 4:00PM.

## 2022-03-13 ENCOUNTER — Other Ambulatory Visit: Payer: Self-pay | Admitting: Pharmacist

## 2022-03-13 MED ORDER — OZEMPIC (2 MG/DOSE) 8 MG/3ML ~~LOC~~ SOPN: 2.0000 mg | PEN_INJECTOR | SUBCUTANEOUS | 11 refills | Status: DC

## 2022-03-17 ENCOUNTER — Other Ambulatory Visit: Payer: Self-pay | Admitting: Cardiology

## 2022-03-17 ENCOUNTER — Ambulatory Visit: Payer: Medicare PPO | Attending: Cardiology

## 2022-03-17 DIAGNOSIS — R030 Elevated blood-pressure reading, without diagnosis of hypertension: Secondary | ICD-10-CM

## 2022-03-17 DIAGNOSIS — G4733 Obstructive sleep apnea (adult) (pediatric): Secondary | ICD-10-CM | POA: Diagnosis not present

## 2022-03-17 DIAGNOSIS — I10 Essential (primary) hypertension: Secondary | ICD-10-CM | POA: Diagnosis not present

## 2022-03-17 NOTE — Progress Notes (Unsigned)
24 Hour ambulatory blood pressure monitor applied using standard adult cuff, which was right at the upper limit of fitting. Patients hand went numb when cuff inflated the second time.  Patient states, the large cuff is usually used at the Dr. Gabriel Carina, so she was also given the large cuff to use if it was more comfortable for her.

## 2022-05-10 ENCOUNTER — Emergency Department (HOSPITAL_COMMUNITY): Payer: Medicare PPO

## 2022-05-10 ENCOUNTER — Emergency Department (HOSPITAL_COMMUNITY)
Admission: EM | Admit: 2022-05-10 | Discharge: 2022-05-11 | Disposition: A | Discharge status: Home / Self Care | Payer: Medicare PPO | Attending: Emergency Medicine | Admitting: Emergency Medicine

## 2022-05-10 ENCOUNTER — Other Ambulatory Visit: Payer: Self-pay

## 2022-05-10 DIAGNOSIS — M79645 Pain in left finger(s): Secondary | ICD-10-CM | POA: Diagnosis present

## 2022-05-10 DIAGNOSIS — S63281A Dislocation of proximal interphalangeal joint of left index finger, initial encounter: Secondary | ICD-10-CM | POA: Diagnosis not present

## 2022-05-10 DIAGNOSIS — W010XXA Fall on same level from slipping, tripping and stumbling without subsequent striking against object, initial encounter: Secondary | ICD-10-CM | POA: Insufficient documentation

## 2022-05-10 DIAGNOSIS — M7989 Other specified soft tissue disorders: Secondary | ICD-10-CM | POA: Diagnosis not present

## 2022-05-10 MED ORDER — IBUPROFEN 400 MG PO TABS
600.0000 mg | ORAL_TABLET | Freq: Once | ORAL | Status: AC
  Administered 2022-05-10: 600 mg via ORAL
  Filled 2022-05-10: qty 1

## 2022-05-10 MED ORDER — LIDOCAINE HCL (PF) 1 % IJ SOLN
5.0000 mL | Freq: Once | INTRAMUSCULAR | Status: DC
  Filled 2022-05-10: qty 5

## 2022-05-10 NOTE — ED Provider Triage Note (Signed)
Emergency Medicine Provider Triage Evaluation Note  Ann Fernandez , a 66 y.o. female  was evaluated in triage.  Pt complains of fall and left finger injury, landing on outstretched hands.  No head injury.  Not on A/C.  Left 4th finger bent.  Review of Systems  Positive: Myalgia Negative: Headache  Physical Exam  BP (!) 175/85   Pulse 78   Temp 97.8 F (36.6 C) (Oral)   Resp (!) 22   LMP 05/26/1993 (Exact Date)   SpO2 96%  Gen:   Awake, no distress   Resp:  Normal effort  MSK:   Moves extremities without difficulty; left 4th distal finger with obvious deformity   Medical Decision Making  Medically screening exam initiated at 6:59 PM.  Appropriate orders placed.  Ann Fernandez was informed that the remainder of the evaluation will be completed by another provider, this initial triage assessment does not replace that evaluation, and the importance of remaining in the ED until their evaluation is complete.  Xrays ordered, motrin for pain - isolated finger injury after fall   Wyvonnia Dusky, MD 05/10/22 1900

## 2022-05-10 NOTE — ED Triage Notes (Signed)
Patient tripped and fell this evening at a local store injured her left hand with left index finger deformity/swelling .

## 2022-05-11 DIAGNOSIS — S63281A Dislocation of proximal interphalangeal joint of left index finger, initial encounter: Secondary | ICD-10-CM | POA: Diagnosis not present

## 2022-05-11 NOTE — ED Provider Notes (Signed)
[facility] EMERGENCY DEPARTMENT Provider Note   CSN: [id number] Arrival date & time: 05/10/22  1744     History  Chief Complaint  Patient presents with   Hand Injury    Ann Fernandez is a 66 y.o. female.  HPI Patient is a 66 year old female presents emergency room today after mechanical fall where she tripped forward and caught herself with her hands she states that she had immediate pain and deformity at the left index finger specifically it seems that this is located at the PIP joint.  She did not strike her head or lose consciousness she denies any back or neck pain.  She has no other areas of pain in her upper or lower extremities.  She is not taking any anticoagulants.    Home Medications Prior to Admission medications   Medication Sig Start Date End Date Taking? Authorizing Provider  allopurinol (ZYLOPRIM) 100 MG tablet Take 2 tablets by mouth daily. 07/12/18   [provider]  calcium-vitamin D (OSCAL WITH D) 500-200 MG-UNIT per tablet Take 1 tablet by mouth daily with breakfast.     [provider]  Evolocumab (REPATHA SURECLICK) 725 MG/ML SOAJ Inject 1 pen  into the skin every 14 (fourteen) days. 01/24/22   Freada Bergeron, MD  fenofibrate (TRICOR) 145 MG tablet Take 1 tablet (145 mg total) by mouth daily. 12/30/21   Freada Bergeron, MD  fexofenadine (ALLEGRA) 180 MG tablet Take 180 mg by mouth every evening.    [provider]  fish oil-omega-3 fatty acids 1000 MG capsule Take 1 g by mouth 2 (two) times daily.     [provider]  fluticasone (FLONASE) 50 MCG/ACT nasal spray as needed. 01/26/19   [provider]  hydrochlorothiazide (HYDRODIURIL) 25 MG tablet TAKE ONE TABLET BY MOUTH ONCE DAILY. 09/12/21   Freada Bergeron, MD  losartan (COZAAR) 50 MG tablet Take 50 mg by mouth daily.    [provider]  meclizine (ANTIVERT) 25 MG tablet Take 25 mg by mouth 3 (three) times daily as needed for  dizziness.  08/17/12   [provider]  meloxicam (MOBIC) 15 MG tablet Take 15 mg by mouth daily.    [provider]  metFORMIN (GLUCOPHAGE) 500 MG tablet Take 500 mg by mouth every evening.  12/15/14   [provider]  methylcellulose oral powder Take 1 packet by mouth as needed.     [provider]  mometasone (NASONEX) 50 MCG/ACT nasal spray Place 2 sprays into the nose at bedtime.  07/09/15   [provider]  nebivolol (BYSTOLIC) 10 MG tablet TAKE (1) TABLET BY MOUTH ONCE DAILY. 03/17/22   Sueanne Margarita, MD  omeprazole (PRILOSEC) 20 MG capsule Take 20 mg by mouth daily as needed (for acid reflux).     [provider]  Semaglutide, 2 MG/DOSE, (OZEMPIC, 2 MG/DOSE,) 8 MG/3ML SOPN Inject 2 mg into the skin once a week. 03/13/22   Freada Bergeron, MD      Allergies    Demerol [meperidine], Penicillins, Sulfa antibiotics, Sulfasalazine, Triamterene, Amlodipine besylate, Arimidex [anastrozole], Lipitor [atorvastatin], Lisinopril, Propoxyphene, Simvastatin, and Topiramate    Review of Systems   Review of Systems  Physical Exam Updated Vital Signs BP 137/77   Pulse 66   Temp 98.6 F (37 C)   Resp 18   LMP 05/26/1993 (Exact Date)   SpO2 97%  Physical Exam Vitals and nursing note reviewed.  Constitutional:  General: She is not in acute distress.    Appearance: Normal appearance. She is not ill-appearing.  HENT:     Head: Normocephalic and atraumatic.  Eyes:     General: No scleral icterus.       Right eye: No discharge.        Left eye: No discharge.     Conjunctiva/sclera: Conjunctivae normal.  Pulmonary:     Effort: Pulmonary effort is normal.     Breath sounds: No stridor.  Musculoskeletal:     Comments: TTP and deformity at the PIP of L  index finger. Some TTP of distal phalanx of left ring finger as well.  No other bony tenderness over joints or long bones of the upper and lower extremities.    No neck or back  midline tenderness, step-off, deformity, or bruising. Able to turn head left and right 45 degrees without difficulty.  Full range of motion of upper and lower extremity joints shown after palpation was conducted; with 5/5 symmetrical strength in upper and lower extremities. No chest wall tenderness, no facial or cranial tenderness.   Patient has intact sensation grossly in lower and upper extremities. Patient able to ambulate without difficulty.  Skin:    General: Skin is warm and dry.  Neurological:     Mental Status: She is alert and oriented to person, place, and time. Mental status is at baseline.     ED Results / Procedures / Treatments   Labs (all labs ordered are listed, but only abnormal results are displayed) Labs Reviewed - No data to display  EKG None  Radiology DG Finger Index Left  Result Date: 05/11/2022 CLINICAL DATA:  Left hand index PIP joint dislocation, postreduction. EXAM: LEFT INDEX FINGER 2+V COMPARISON:  Earlier study today at 7:13 p.m. FINDINGS: There is generalized swelling of the index finger. Successful index PIP joint dislocation reduction is noted. Small bony fragments at the dorsal base of the index middle phalanx and at the ulnar side of the index PIP joint are noted concerning for chip fractures. There is joint space loss and slight spurring of interphalangeal joints, joint space loss and more prominent spurring first CMC joint. Normal bone mineralization. IMPRESSION: 1. Successful index PIP joint dislocation reduction. 2. Small bony fragments at the dorsal base of the index middle phalanx and at the ulnar side of the index PIP joint concerning for chip fractures. 3. Degenerative changes. Electronically Signed   By: Telford Nab M.D.   On: 05/11/2022 00:21   DG Hand Complete Left  Result Date: 05/10/2022 CLINICAL DATA:  Injury to the fourth finger EXAM: LEFT HAND - COMPLETE 3+ VIEW COMPARISON:  None Available. FINDINGS: There is diffuse soft tissue  swelling of the second finger. There is 1/2 shaft with lateral dislocation of the second middle phalanx in relation to the second proximal phalanx. Questionable adjacent tiny fracture fragment seen at the base of the second middle phalanx. Mild degenerative changes affect the interphalangeal joints diffusely. There are moderate degenerative changes of the first carpometacarpal joint. IMPRESSION: 1/2 shaft with lateral dislocation of the second middle phalanx in relation to the second proximal phalanx. Questionable adjacent tiny fracture fragment seen at the base of the second middle phalanx. Electronically Signed   By: Ronney Asters M.D.   On: 05/10/2022 19:39    Procedures Reduction of dislocation  Date/Time: 05/11/2022 2:37 AM  Performed by: Tedd Sias, PA Authorized by: Tedd Sias, PA  Consent: Verbal consent obtained. Risks and benefits: risks,  benefits and alternatives were discussed Consent given by: patient Patient understanding: patient states understanding of the procedure being performed Patient consent: the patient's understanding of the procedure matches consent given Relevant documents: relevant documents present and verified Test results: test results available and properly labeled Imaging studies: imaging studies available Patient identity confirmed: verbally with patient and arm band Local anesthesia used: no  Anesthesia: Local anesthesia used: no  Sedation: Patient sedated: no  Patient tolerance: patient tolerated the procedure well with no immediate complications Comments: Left index finger was dislocated the PIP this was successfully reduced and splinted.  X-ray was taken which showed good reduction.  Distally neurovascularly intact after procedure and x-ray and splinting.   Archie Endo Block  Date/Time: 05/11/2022 2:37 AM  Performed by: Tedd Sias, PA Authorized by: Tedd Sias, PA   Consent:    Consent obtained:  Verbal   Consent given by:   Patient   Risks discussed:  Unsuccessful block, nerve damage, infection, intravenous injection and pain   Alternatives discussed:  No treatment, delayed treatment and referral Indications:    Indications:  Pain relief and procedural anesthesia Location:    Body area:  Upper extremity Pre-procedure details:    Skin preparation:  Alcohol Procedure details:    Block needle gauge:  25 G   Anesthetic injected:  Lidocaine 1% w/o epi   Steroid injected:  None   Additive injected:  None   Injection procedure:  Anatomic landmarks identified, incremental injection, negative aspiration for blood and anatomic landmarks palpated Post-procedure details:    Dressing:  Sterile dressing   Outcome:  Anesthesia achieved   Procedure completion:  Tolerated well, no immediate complications Comments:     Left index and digital block - successful     Medications Ordered in ED Medications  lidocaine (PF) (XYLOCAINE) 1 % injection 5 mL (has no administration in time range)  ibuprofen (ADVIL) tablet 600 mg (600 mg Oral Given 05/10/22 1909)    ED Course/ Medical Decision Making/ A&P                           Medical Decision Making Amount and/or Complexity of Data Reviewed Radiology: ordered.   Patient is a 66 year old female presents emergency room today after mechanical fall where she tripped forward and caught herself with her hands she states that she had immediate pain and deformity at the left index finger specifically it seems that this is located at the PIP joint.  She did not strike her head or lose consciousness she denies any back or neck pain.  She has no other areas of pain in her upper or lower extremities.  She is not taking any anticoagulants.  I personally viewed the x-ray of patient's left index finger.  The PIP joint has displacement of the middle phalanx radially off of the joint and there is some flecks of bone indicating chip fractures.  Patient is distally neurovascularly intact  to sensation in her left index finger and cap refill is less than 2 seconds.  I performed a digital block which was successful and I reduced the patient's finger placed in splints personally by me cap refill less than 2 seconds after placement and repeated x-ray which showed successful reduction.  She will follow-up with hand surgery, will keep splint in place until then.  I did make some adjustments to the splint after x-ray was taken to make sure that MCP was well aligned. I personally reviewed  x-ray of pre and postreduction  Patient feels much improved.  Vital signs normal.  Will discharge home at this time.  Return precautions discussed  Final Clinical Impression(s) / ED Diagnoses Final diagnoses:  Closed traumatic dislocation of proximal interphalangeal (PIP) joint of left index finger    Rx / DC Orders ED Discharge Orders     None         Tedd Sias, [place] 05/11/22 [place], [place], DO 05/11/22 [phone]

## 2022-05-11 NOTE — Discharge Instructions (Addendum)
Keep splint in place and follow-up with hand surgery.  Tylenol 1000 mg every 6 hours for pain ice will help for the first 24 hours.

## 2022-05-14 DIAGNOSIS — M25642 Stiffness of left hand, not elsewhere classified: Secondary | ICD-10-CM | POA: Diagnosis not present

## 2022-05-14 DIAGNOSIS — M79645 Pain in left finger(s): Secondary | ICD-10-CM | POA: Diagnosis not present

## 2022-05-15 ENCOUNTER — Telehealth: Payer: Self-pay | Admitting: *Deleted

## 2022-05-15 DIAGNOSIS — S62631A Displaced fracture of distal phalanx of left index finger, initial encounter for closed fracture: Secondary | ICD-10-CM | POA: Diagnosis not present

## 2022-05-15 DIAGNOSIS — Z6835 Body mass index (BMI) 35.0-35.9, adult: Secondary | ICD-10-CM | POA: Diagnosis not present

## 2022-05-15 NOTE — Telephone Encounter (Signed)
     Patient  visit on 05/11/2022  at [facility]  was for Broken finger   Have you been able to follow up with your primary care physician?  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  Are there diet recommendations that you are having difficulty following?  Patient expresses understanding of discharge instructions and education provided has no other needs at this time.   Eddy [phone] [address] , [place] [address] Email : [email or web address]

## 2022-06-04 DIAGNOSIS — M25642 Stiffness of left hand, not elsewhere classified: Secondary | ICD-10-CM | POA: Diagnosis not present

## 2022-06-04 DIAGNOSIS — M79645 Pain in left finger(s): Secondary | ICD-10-CM | POA: Diagnosis not present

## 2022-06-11 DIAGNOSIS — M25642 Stiffness of left hand, not elsewhere classified: Secondary | ICD-10-CM | POA: Diagnosis not present

## 2022-06-18 DIAGNOSIS — M25642 Stiffness of left hand, not elsewhere classified: Secondary | ICD-10-CM | POA: Diagnosis not present

## 2022-06-25 DIAGNOSIS — M25642 Stiffness of left hand, not elsewhere classified: Secondary | ICD-10-CM | POA: Diagnosis not present

## 2022-06-25 DIAGNOSIS — G4733 Obstructive sleep apnea (adult) (pediatric): Secondary | ICD-10-CM | POA: Diagnosis not present

## 2022-06-25 DIAGNOSIS — I10 Essential (primary) hypertension: Secondary | ICD-10-CM | POA: Diagnosis not present

## 2022-06-25 DIAGNOSIS — E669 Obesity, unspecified: Secondary | ICD-10-CM | POA: Diagnosis not present

## 2022-07-02 DIAGNOSIS — M25642 Stiffness of left hand, not elsewhere classified: Secondary | ICD-10-CM | POA: Diagnosis not present

## 2022-07-03 DIAGNOSIS — E119 Type 2 diabetes mellitus without complications: Secondary | ICD-10-CM | POA: Diagnosis not present

## 2022-07-03 DIAGNOSIS — H04203 Unspecified epiphora, bilateral lacrimal glands: Secondary | ICD-10-CM | POA: Diagnosis not present

## 2022-07-03 DIAGNOSIS — H52222 Regular astigmatism, left eye: Secondary | ICD-10-CM | POA: Diagnosis not present

## 2022-07-03 DIAGNOSIS — H5213 Myopia, bilateral: Secondary | ICD-10-CM | POA: Diagnosis not present

## 2022-07-03 DIAGNOSIS — H524 Presbyopia: Secondary | ICD-10-CM | POA: Diagnosis not present

## 2022-07-03 DIAGNOSIS — H35033 Hypertensive retinopathy, bilateral: Secondary | ICD-10-CM | POA: Diagnosis not present

## 2022-07-03 DIAGNOSIS — I10 Essential (primary) hypertension: Secondary | ICD-10-CM | POA: Diagnosis not present

## 2022-07-03 DIAGNOSIS — H2513 Age-related nuclear cataract, bilateral: Secondary | ICD-10-CM | POA: Diagnosis not present

## 2022-07-03 DIAGNOSIS — H53143 Visual discomfort, bilateral: Secondary | ICD-10-CM | POA: Diagnosis not present

## 2022-07-07 DIAGNOSIS — E1143 Type 2 diabetes mellitus with diabetic autonomic (poly)neuropathy: Secondary | ICD-10-CM | POA: Diagnosis not present

## 2022-07-07 DIAGNOSIS — Z6835 Body mass index (BMI) 35.0-35.9, adult: Secondary | ICD-10-CM | POA: Diagnosis not present

## 2022-07-07 DIAGNOSIS — I10 Essential (primary) hypertension: Secondary | ICD-10-CM | POA: Diagnosis not present

## 2022-07-07 DIAGNOSIS — M25761 Osteophyte, right knee: Secondary | ICD-10-CM | POA: Diagnosis not present

## 2022-07-07 DIAGNOSIS — E785 Hyperlipidemia, unspecified: Secondary | ICD-10-CM | POA: Diagnosis not present

## 2022-07-10 DIAGNOSIS — M25642 Stiffness of left hand, not elsewhere classified: Secondary | ICD-10-CM | POA: Diagnosis not present

## 2022-07-14 DIAGNOSIS — M79645 Pain in left finger(s): Secondary | ICD-10-CM | POA: Diagnosis not present

## 2022-07-16 DIAGNOSIS — M25642 Stiffness of left hand, not elsewhere classified: Secondary | ICD-10-CM | POA: Diagnosis not present

## 2022-07-21 DIAGNOSIS — Z1231 Encounter for screening mammogram for malignant neoplasm of breast: Secondary | ICD-10-CM | POA: Diagnosis not present

## 2022-07-22 DIAGNOSIS — G4733 Obstructive sleep apnea (adult) (pediatric): Secondary | ICD-10-CM | POA: Diagnosis not present

## 2022-07-22 DIAGNOSIS — I10 Essential (primary) hypertension: Secondary | ICD-10-CM | POA: Diagnosis not present

## 2022-07-24 DIAGNOSIS — E669 Obesity, unspecified: Secondary | ICD-10-CM | POA: Diagnosis not present

## 2022-07-24 DIAGNOSIS — I10 Essential (primary) hypertension: Secondary | ICD-10-CM | POA: Diagnosis not present

## 2022-07-24 DIAGNOSIS — G4733 Obstructive sleep apnea (adult) (pediatric): Secondary | ICD-10-CM | POA: Diagnosis not present

## 2022-07-29 DIAGNOSIS — D2272 Melanocytic nevi of left lower limb, including hip: Secondary | ICD-10-CM | POA: Diagnosis not present

## 2022-07-29 DIAGNOSIS — L821 Other seborrheic keratosis: Secondary | ICD-10-CM | POA: Diagnosis not present

## 2022-07-29 DIAGNOSIS — L918 Other hypertrophic disorders of the skin: Secondary | ICD-10-CM | POA: Diagnosis not present

## 2022-07-29 DIAGNOSIS — D225 Melanocytic nevi of trunk: Secondary | ICD-10-CM | POA: Diagnosis not present

## 2022-08-01 DIAGNOSIS — M25642 Stiffness of left hand, not elsewhere classified: Secondary | ICD-10-CM | POA: Diagnosis not present

## 2022-08-24 DIAGNOSIS — I10 Essential (primary) hypertension: Secondary | ICD-10-CM | POA: Diagnosis not present

## 2022-08-24 DIAGNOSIS — G4733 Obstructive sleep apnea (adult) (pediatric): Secondary | ICD-10-CM | POA: Diagnosis not present

## 2022-08-24 DIAGNOSIS — E669 Obesity, unspecified: Secondary | ICD-10-CM | POA: Diagnosis not present

## 2022-09-19 DIAGNOSIS — M19042 Primary osteoarthritis, left hand: Secondary | ICD-10-CM | POA: Diagnosis not present

## 2022-09-19 DIAGNOSIS — M19041 Primary osteoarthritis, right hand: Secondary | ICD-10-CM | POA: Diagnosis not present

## 2022-09-19 DIAGNOSIS — M159 Polyosteoarthritis, unspecified: Secondary | ICD-10-CM | POA: Diagnosis not present

## 2022-09-19 DIAGNOSIS — M180 Bilateral primary osteoarthritis of first carpometacarpal joints: Secondary | ICD-10-CM | POA: Diagnosis not present

## 2022-09-23 DIAGNOSIS — G4733 Obstructive sleep apnea (adult) (pediatric): Secondary | ICD-10-CM | POA: Diagnosis not present

## 2022-09-23 DIAGNOSIS — E669 Obesity, unspecified: Secondary | ICD-10-CM | POA: Diagnosis not present

## 2022-09-23 DIAGNOSIS — I10 Essential (primary) hypertension: Secondary | ICD-10-CM | POA: Diagnosis not present

## 2022-09-29 ENCOUNTER — Telehealth: Payer: Self-pay | Admitting: Cardiology

## 2022-09-29 NOTE — Telephone Encounter (Signed)
Pt is followed by our lipid clinic and we have not received a fax on this pt via onbase to Dr. Shari Prows.   Will send this message to our Pharmacist in lipid clinic for further management of this request.

## 2022-09-29 NOTE — Telephone Encounter (Signed)
[employer] office called to see about recommendations for statin therapy that was faxed over on 09/15/2022 and would like a call back regarding if it was received. Please advise

## 2022-09-29 NOTE — Telephone Encounter (Signed)
Have not received any faxes. She's on Repatha due to statin intolerance. Her cholesterol is well controlled, does not need med adjustments.

## 2022-10-21 DIAGNOSIS — G4733 Obstructive sleep apnea (adult) (pediatric): Secondary | ICD-10-CM | POA: Diagnosis not present

## 2022-10-24 DIAGNOSIS — G4733 Obstructive sleep apnea (adult) (pediatric): Secondary | ICD-10-CM | POA: Diagnosis not present

## 2022-10-24 DIAGNOSIS — I10 Essential (primary) hypertension: Secondary | ICD-10-CM | POA: Diagnosis not present

## 2022-10-24 DIAGNOSIS — E669 Obesity, unspecified: Secondary | ICD-10-CM | POA: Diagnosis not present

## 2022-11-23 DIAGNOSIS — E669 Obesity, unspecified: Secondary | ICD-10-CM | POA: Diagnosis not present

## 2022-11-23 DIAGNOSIS — G4733 Obstructive sleep apnea (adult) (pediatric): Secondary | ICD-10-CM | POA: Diagnosis not present

## 2022-11-23 DIAGNOSIS — I10 Essential (primary) hypertension: Secondary | ICD-10-CM | POA: Diagnosis not present

## 2022-11-25 ENCOUNTER — Telehealth: Payer: Self-pay | Admitting: Cardiology

## 2022-11-25 NOTE — Telephone Encounter (Signed)
Pt calling because she received a letter that Dr. Shari Prows was leaving. She is wanting to know who Dr. Shari Prows recommends she see going forward  (pt was a former Radio broadcast assistant pt).  Pt would like to remain with a female. She understands will forward to Dr. Shari Prows for advisement  (She is currently scheduled to see Shari Prows in September but understands this will be rescheduled once provider advised on)

## 2022-11-25 NOTE — Telephone Encounter (Signed)
Pt is requesting a callback regarding recommendations of MD. Please advise

## 2022-11-25 NOTE — Telephone Encounter (Signed)
I did mention that to her.  She said she trusts your judgement, but has been so pleased with both you and Dr. Delton See previously. Let me know if that is your recommendation.... Thanks

## 2022-11-26 NOTE — Telephone Encounter (Signed)
Spoke with patient and shared Dr. Devin Going recommendations on new providers:  I think Dr. Mayford Knife would be excellent if she would like to stay in the [facility] office.  If [facility] is closer to home, would refer to Dr. Jacques Navy. If she prefers [facility], both Dr. Duke Salvia and Dr. Cristal Deer are amazing.    Patient states she already sees Dr. Mayford Knife for sleep and would like to stay at the [facility] office. She wishes to also see Dr. Mayford Knife for general cardiology.  Patient has an appt scheduled with Dr. Mayford Knife on 01/29/23 for OSA F/U.  Will forward to Dr. Norris Cross nurse.

## 2022-12-24 DIAGNOSIS — G4733 Obstructive sleep apnea (adult) (pediatric): Secondary | ICD-10-CM | POA: Diagnosis not present

## 2022-12-24 DIAGNOSIS — I10 Essential (primary) hypertension: Secondary | ICD-10-CM | POA: Diagnosis not present

## 2022-12-24 DIAGNOSIS — E669 Obesity, unspecified: Secondary | ICD-10-CM | POA: Diagnosis not present

## 2023-01-05 DIAGNOSIS — I10 Essential (primary) hypertension: Secondary | ICD-10-CM | POA: Diagnosis not present

## 2023-01-05 DIAGNOSIS — E785 Hyperlipidemia, unspecified: Secondary | ICD-10-CM | POA: Diagnosis not present

## 2023-01-05 DIAGNOSIS — Z0000 Encounter for general adult medical examination without abnormal findings: Secondary | ICD-10-CM | POA: Diagnosis not present

## 2023-01-05 DIAGNOSIS — R809 Proteinuria, unspecified: Secondary | ICD-10-CM | POA: Diagnosis not present

## 2023-01-05 DIAGNOSIS — M25761 Osteophyte, right knee: Secondary | ICD-10-CM | POA: Diagnosis not present

## 2023-01-05 DIAGNOSIS — Z6835 Body mass index (BMI) 35.0-35.9, adult: Secondary | ICD-10-CM | POA: Diagnosis not present

## 2023-01-05 DIAGNOSIS — E1143 Type 2 diabetes mellitus with diabetic autonomic (poly)neuropathy: Secondary | ICD-10-CM | POA: Diagnosis not present

## 2023-01-05 LAB — LAB REPORT - SCANNED
A1c: 5.9
EGFR: 88

## 2023-01-09 DIAGNOSIS — M170 Bilateral primary osteoarthritis of knee: Secondary | ICD-10-CM | POA: Diagnosis not present

## 2023-01-15 ENCOUNTER — Other Ambulatory Visit: Payer: Self-pay | Admitting: Cardiology

## 2023-01-19 ENCOUNTER — Other Ambulatory Visit: Payer: Self-pay

## 2023-01-19 ENCOUNTER — Other Ambulatory Visit: Payer: Self-pay | Admitting: Pharmacist

## 2023-01-19 DIAGNOSIS — G4733 Obstructive sleep apnea (adult) (pediatric): Secondary | ICD-10-CM | POA: Diagnosis not present

## 2023-01-19 MED ORDER — REPATHA SURECLICK 140 MG/ML ~~LOC~~ SOAJ: 140.0000 mg | SUBCUTANEOUS | 3 refills | Status: DC

## 2023-01-19 MED ORDER — REPATHA SURECLICK 140 MG/ML ~~LOC~~ SOAJ: 140.0000 mg | SUBCUTANEOUS | 11 refills | Status: DC

## 2023-01-24 DIAGNOSIS — I10 Essential (primary) hypertension: Secondary | ICD-10-CM | POA: Diagnosis not present

## 2023-01-24 DIAGNOSIS — E669 Obesity, unspecified: Secondary | ICD-10-CM | POA: Diagnosis not present

## 2023-01-24 DIAGNOSIS — G4733 Obstructive sleep apnea (adult) (pediatric): Secondary | ICD-10-CM | POA: Diagnosis not present

## 2023-01-29 ENCOUNTER — Ambulatory Visit (INDEPENDENT_AMBULATORY_CARE_PROVIDER_SITE_OTHER): Payer: Medicare PPO

## 2023-01-29 ENCOUNTER — Ambulatory Visit: Payer: Medicare PPO | Attending: Cardiology | Admitting: Cardiology

## 2023-01-29 ENCOUNTER — Other Ambulatory Visit: Payer: Self-pay | Admitting: Cardiology

## 2023-01-29 ENCOUNTER — Encounter: Payer: Self-pay | Admitting: Cardiology

## 2023-01-29 VITALS — BP 144/80 | HR 65 | Ht 65.0 in | Wt 217.0 lb

## 2023-01-29 DIAGNOSIS — G4733 Obstructive sleep apnea (adult) (pediatric): Secondary | ICD-10-CM | POA: Diagnosis not present

## 2023-01-29 DIAGNOSIS — R011 Cardiac murmur, unspecified: Secondary | ICD-10-CM

## 2023-01-29 DIAGNOSIS — E7849 Other hyperlipidemia: Secondary | ICD-10-CM

## 2023-01-29 DIAGNOSIS — R002 Palpitations: Secondary | ICD-10-CM

## 2023-01-29 DIAGNOSIS — R931 Abnormal findings on diagnostic imaging of heart and coronary circulation: Secondary | ICD-10-CM

## 2023-01-29 DIAGNOSIS — I10 Essential (primary) hypertension: Secondary | ICD-10-CM

## 2023-01-29 NOTE — Patient Instructions (Signed)
Medication Instructions:  Your physician recommends that you continue on your current medications as directed. Please refer to the Current Medication list given to you today.  *If you need a refill on your cardiac medications before your next appointment, please call your pharmacy*   Lab Work: None.  If you have labs (blood work) drawn today and your tests are completely normal, you will receive your results only by: MyChart Message (if you have MyChart) OR A paper copy in the mail If you have any lab test that is abnormal or we need to change your treatment, we will call you to review the results.   Testing/Procedures: Ann Fernandez- Long Term Monitor Instructions  Your physician has requested you wear a ZIO patch monitor for 14 days.  This is a single patch monitor. [employer] supplies one patch monitor per enrollment. Additional stickers are not available. Please do not apply patch if you will be having a Nuclear Stress Test,  Echocardiogram, Cardiac CT, MRI, or Chest Xray during the period you would be wearing the  monitor. The patch cannot be worn during these tests. You cannot remove and re-apply the  ZIO XT patch monitor.  Your ZIO patch monitor will be mailed 3 day USPS to your address on file. It may take 3-5 days  to receive your monitor after you have been enrolled.  Once you have received your monitor, please review the enclosed instructions. Your monitor  has already been registered assigning a specific monitor serial # to you.  Billing and Patient Assistance Program Information  We have supplied [employer] with any of your insurance information on file for billing purposes. [employer] offers a sliding scale Patient Assistance Program for patients that do not have  insurance, or whose insurance does not completely cover the cost of the ZIO monitor.  You must apply for the Patient Assistance Program to qualify for this discounted rate.  To apply, please call [employer] at [phone],  select option 4, select option 2, ask to apply for  Patient Assistance Program. Meredeth Ide will ask your household income, and how many people  are in your household. They will quote your out-of-pocket cost based on that information.  [employer] will also be able to set up a 76-month, interest-free payment plan if needed.  Applying the monitor   Shave hair from upper left chest.  Hold abrader disc by orange tab. Rub abrader in 40 strokes over the upper left chest as  indicated in your monitor instructions.  Clean area with 4 enclosed alcohol pads. Let dry.  Apply patch as indicated in monitor instructions. Patch will be placed under collarbone on left  side of chest with arrow pointing upward.  Rub patch adhesive wings for 2 minutes. Remove white label marked "1". Remove the white  label marked "2". Rub patch adhesive wings for 2 additional minutes.  While looking in a mirror, press and release button in center of patch. A small green light will  flash 3-4 times. This will be your only indicator that the monitor has been turned on.  Do not shower for the first 24 hours. You may shower after the first 24 hours.  Press the button if you feel a symptom. You will hear a small click. Record Date, Time and  Symptom in the Patient Logbook.  When you are ready to remove the patch, follow instructions on the last 2 pages of Patient  Logbook. Stick patch monitor onto the last page of Patient Logbook.  Place Patient Logbook  in the blue and white box. Use locking tab on box and tape box closed  securely. The blue and white box has prepaid postage on it. Please place it in the mailbox as  soon as possible. Your physician should have your test results approximately 7 days after the  monitor has been mailed back to [facility].  Call [facility] Customer Care at [phone] if you have questions regarding  your ZIO XT patch monitor. Call them immediately if you see an orange light blinking on your   monitor.  If your monitor falls off in less than 4 days, contact our Monitor department at [phone].  If your monitor becomes loose or falls off after 4 days call [employer] at [phone] for  suggestions on securing your monitor    Follow-Up: At [facility], you and your health needs are our priority.  As part of our continuing mission to provide you with exceptional heart care, we have created designated Provider Care Teams.  These Care Teams include your primary Cardiologist (physician) and Advanced Practice Providers (APPs -  Physician Assistants and Nurse Practitioners) who all work together to provide you with the care you need, when you need it.  We recommend signing up for the patient portal called "MyChart".  Sign up information is provided on this After Visit Summary.  MyChart is used to connect with patients for Virtual Visits (Telemedicine).  Patients are able to view lab/test results, encounter notes, upcoming appointments, etc.  Non-urgent messages can be sent to your provider as well.   To learn more about what you can do with MyChart, go to [email or web address].    Your next appointment:   1 year(s)  Provider:   Dr. Armanda Magic, MD   Other Instructions Dr. Mayford Knife has ordered a chin strap for our cpap mask to help with mouth dryness. Someone from your DME company should be contacting you regarding delivery.

## 2023-01-29 NOTE — Addendum Note (Signed)
Addended by: Luellen Pucker on: 01/29/2023 10:02 AM   Modules accepted: Orders

## 2023-01-29 NOTE — Progress Notes (Signed)
Cardiology Office Note:    Date:  01/29/2023   ID:  Ann Fernandez, DOB 04-19-1956, MRN [id number]  PCP:  Toma Deiters, MD  Cardiologist:  Armanda Magic, MD   Referring MD: Toma Deiters, MD   Reason for visit: OSA and HTN  History of Present Illness:    Ann Fernandez is a 67 y.o. female with a hx of mild OSA , hypertension, coronary artery calcifications with a coronary calcium score of 248 in 2023, hyperlipidemia and obesity who is on CPAP.  She is here today for followup and is doing well.  She denies any chest pain or pressure, SOB, DOE, PND, orthopnea, LE edema, dizziness or syncope. Occasionally she will notice some mild palpitations but nothing sustained. Usually occurs on a daily basis. She is compliant with her meds and is tolerating meds with no SE.    She is doing well with her PAP device and thinks that she has gotten used to it.  She tolerates the nasal mask and feels the pressure is adequate.  Since going on PAP she feels rested in the am and has no significant daytime sleepiness.  Occasionally she will take a nap during the day. She denies any significant nasal dryness or nasal congestion.  She has had problems with significant mouth dryness but does not use a chin strap. She does not think that she snores.    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 Breast, lumpectomy, Left - ATYPICAL DUCTAL HYPERPLASIA, SEE COMMENT. - SURGICAL MARGINS, NEGATIVE FOR ATYPIA OR MALIGNANCY. - PREVIOUS BIOPSY SITE IDENTIFIED. - ONE LYMPH NODE, NEGATIVE FOR TUMOR (0/1), 2. Breast, excision, Left medial margin - BENIGN BREAST TISSUE, SEE COMMENT. - NEGATIVE  FOR ATYPIA OR MALIGNANCY.    DDD (degenerative disc disease)    Diabetes mellitus without complication (HCC)    DIVERTICULOSIS, COLON 12/21/2006   Qualifier: Diagnosis of  By: Cato Mulligan MD, Bruce     DOE (dyspnea on exertion) 01/20/2014   Encounter for long-term (current) use of other medications 09/26/2011   Essential hypertension 12/21/2006     Pt states she has a white coat syndrome and her BP is OK at home.      Family history of adverse reaction to anesthesia    Anesthesia said they woke her mother up too fast and developed A-Fib .   GERD (gastroesophageal reflux disease)    Gout    Hearing loss    HYPERLIPIDEMIA 12/21/2006   Qualifier: Diagnosis of  By: Cato Mulligan MD, Bruce     IBS (irritable bowel syndrome)    Incarcerated incisional hernia s/p lap repair w mesh 10/02/2017 10/02/2017   Obesity (BMI 30-39.9) 06/27/2014   OSA (obstructive sleep apnea) 03/24/2014   Mild OSA with AHI 12.4/hr now on CPAP at 10cm H2O.  Persistent oxygen desats <88% for 11 minutes on CPAP and now on nighttime O2 at 2L via CPAP    PONV (postoperative nausea and vomiting)    Position carefully due to [facility] back surgeries   Right ovarian cyst 09/12/2017   Wears glasses     Past Surgical History:  Procedure Laterality Date  ABDOMINAL HYSTERECTOMY  1994   [facility] for DUB--ovaries retained     ANTERIOR LUMBAR FUSION     ANTERIOR LUMBAR FUSION  2018   [facility] Dr Velora Heckler.  2 level ALIF at L4-5 and L5-S1   BACK SURGERY  2011   lumb cyst   BACK SURGERY  04/18/2016   Anterior and posterior spinal fusion   BREAST SURGERY     CERVICAL LAMINECTOMY     COLONOSCOPY  09/09/2012   CYST REMOVAL TRUNK     LOWER BACK   DILATION AND CURETTAGE OF UTERUS     INSERTION OF MESH N/A 10/02/2017   Procedure: INSERTION OF MESH;  Surgeon: Karie Soda, MD;  Location: [facility];  Service: General;  Laterality: N/A;   JOINT REPLACEMENT     partial right knee   KNEE ARTHROSCOPY     KNEE ARTHROSCOPY Right  05/25/2013   Procedure: RIGHT ARTHROSCOPY KNEE, PARTIAL MEDIAL MENISCECTOMY, CHONDROPLASTY, PARTIAL LATERAL MENISCECTOMY;  Surgeon: Loanne Drilling, MD;  Location: [facility];  Service: Orthopedics;  Laterality: Right;   KNEE SURGERY     left knee    LAPAROSCOPIC SALPINGO OOPHERECTOMY Bilateral 10/02/2017   Procedure: LAPAROSCOPIC SALPINGO OOPHORECTOMY, CYSTOSCOPY;  Surgeon: Patton Salles, MD;  Location: [facility];  Service: Gynecology;  Laterality: Bilateral;   PARTIAL MASTECTOMY WITH NEEDLE LOCALIZATION Left 06/03/2013   Procedure: PARTIAL MASTECTOMY WITH NEEDLE LOCALIZATION;  Surgeon: Ernestene Mention, MD;  Location: [facility];  Service: General;  Laterality: Left;   POSTERIOR LUMBAR FUSION     RECTOCELE REPAIR N/A 09/27/2019   Procedure: POSTERIOR REPAIR (RECTOCELE)and  Enterocele;  Surgeon: Patton Salles, MD;  Location: [facility];  Service: Gynecology;  Laterality: N/A;   SINUS IRRIGATION     TONSILLECTOMY     TRIGGER FINGER RELEASE     VENTRAL HERNIA REPAIR N/A 10/02/2017   Procedure: LAPAROSCOPIC VENTRAL WALL ASSISTED  HERNIA REPAIR WITH MESH;  Surgeon: Karie Soda, MD;  Location: [facility];  Service: General;  Laterality: N/A;   WISDOM TOOTH EXTRACTION      Current Medications: Current Meds  Medication Sig   allopurinol (ZYLOPRIM) 100 MG tablet Take 2 tablets by mouth daily.   calcium-vitamin D (OSCAL WITH D) 500-200 MG-UNIT per tablet Take 1 tablet by mouth daily with breakfast.    Evolocumab (REPATHA SURECLICK) 140 MG/ML SOAJ Inject 140 mg into the skin every 14 (fourteen) days.   fenofibrate (TRICOR) 145 MG tablet Take 1 tablet (145 mg total) by mouth daily.   fexofenadine (ALLEGRA) 180 MG tablet Take 180 mg by mouth every evening.   fish oil-omega-3 fatty acids 1000 MG capsule Take 1 g by mouth 2 (two) times daily.    fluticasone (FLONASE) 50 MCG/ACT nasal spray as needed.   gabapentin  (NEURONTIN) 300 MG capsule Take by mouth as needed.   hydrochlorothiazide (HYDRODIURIL) 25 MG tablet TAKE ONE TABLET BY MOUTH ONCE DAILY.   meclizine (ANTIVERT) 25 MG tablet Take 25 mg by mouth 3 (three) times daily as needed for dizziness.    meloxicam (MOBIC) 15 MG tablet Take 15 mg by mouth daily.   metFORMIN (GLUCOPHAGE) 500 MG tablet Take 500 mg by mouth every evening.    methylcellulose oral powder Take 1 packet by mouth as needed.    mometasone (NASONEX) 50 MCG/ACT nasal spray Place 2 sprays into the nose at bedtime.    nebivolol (BYSTOLIC) 10 MG tablet TAKE (1) TABLET  BY MOUTH ONCE DAILY.   olmesartan (BENICAR) 40 MG tablet Take 40 mg by mouth daily.   omeprazole (PRILOSEC) 20 MG capsule Take 20 mg by mouth daily as needed (for acid reflux).    Semaglutide, 2 MG/DOSE, (OZEMPIC, 2 MG/DOSE,) 8 MG/3ML SOPN Inject 2 mg into the skin once a week.   [DISCONTINUED] losartan (COZAAR) 50 MG tablet Take 50 mg by mouth daily.     Allergies:   Demerol [meperidine], Penicillins, Sulfa antibiotics, Sulfasalazine, Triamterene, Amlodipine besylate, Arimidex [anastrozole], Lipitor [atorvastatin], Lisinopril, Propoxyphene, Simvastatin, and Topiramate   Social History   Socioeconomic History   Marital status: Married    Spouse name: Not on file   Number of children: Not on file   Years of education: Not on file   Highest education level: Not on file  Occupational History   Not on file  Tobacco Use   Smoking status: Never   Smokeless tobacco: Never  Vaping Use   Vaping status: Never Used  Substance and Sexual Activity   Alcohol use: No   Drug use: No   Sexual activity: Yes    Partners: Male    Comment: TVH  Other Topics Concern   Not on file  Social History Narrative   Not on file   Social Determinants of Health   Financial Resource Strain: Not on file  Food Insecurity: Not on file  Transportation Needs: Not on file  Physical Activity: Not on file  Stress: Not on file  Social  Connections: Not on file     Family History: The patient's family history includes COPD in her father; Colon cancer in her mother; Colon polyps in her father and mother; Deep vein thrombosis in her mother; Heart disease in her brother, father, and mother; Lung disease in her mother; Osteoarthritis in her father; Pulmonary embolism in her brother; Rectal cancer (age of onset: 51) in her mother; Rheum arthritis in her daughter and father. There is no history of Esophageal cancer or Stomach cancer.  ROS:   Please see the history of present illness.    ROS  All other systems reviewed and negative.   EKGs/Labs/Other Studies Reviewed:    The following studies were reviewed today: PAP download   EKG Interpretation Date/Time:  Thursday January 29 2023 09:16:10 EDT Ventricular Rate:  65 PR Interval:  216 QRS Duration:  86 QT Interval:  412 QTC Calculation: 428 R Axis:   -28  Text Interpretation: Sinus rhythm with 1st degree A-V block When compared with ECG of 16-May-2013 14:15, PR interval has increased Confirmed by Armanda Magic ([id number]) on 01/29/2023 9:25:55 AM   . Recent Labs: No results found for requested labs within last 365 days.   Recent Lipid Panel    Component Value Date/Time   CHOL 152 04/04/2021 0856   TRIG 114 04/04/2021 0856   HDL 45 04/04/2021 0856   CHOLHDL 3.4 04/04/2021 0856   CHOLHDL 4.4 10/12/2015 1114   VLDL 44 (H) 10/12/2015 1114   LDLCALC 86 04/04/2021 0856   LDLDIRECT 172.8 06/29/2012 1003    Physical Exam:    VS:  BP (!) 144/80   Pulse 65   Ht 5\' 5"  (1.651 m)   Wt 217 lb (98.4 kg)   LMP 05/26/1993 (Exact Date)   SpO2 97%   BMI 36.11 kg/m     Wt Readings from Last 3 Encounters:  01/29/23 217 lb (98.4 kg)  01/23/22 219 lb 6.4 oz (99.5 kg)  12/23/21 216 lb (98 kg)  GEN: Well nourished, well developed in no acute distress HEENT: Normal NECK: No JVD; No carotid bruits LYMPHATICS: No lymphadenopathy CARDIAC:RRR, no murmurs, rubs,  gallops RESPIRATORY:  Clear to auscultation without rales, wheezing or rhonchi  ABDOMEN: Soft, non-tender, non-distended MUSCULOSKELETAL:  No edema; No deformity  SKIN: Warm and dry NEUROLOGIC:  Alert and oriented x 3 PSYCHIATRIC:  Normal affect  ASSESSMENT:    1. Heart murmur   2. Essential hypertension   3. OSA on CPAP   4. Other hyperlipidemia   5. Agatston coronary artery calcium score between 200 and 399      PLAN:    In order of problems listed above:  OSA - The patient is tolerating PAP therapy well without any problems. The PAP download performed by his DME was personally reviewed and interpreted by me today and showed an AHI of 0.5 /hr on 8 cm H2O with 100 % compliance in using more than 4 hours nightly.  The patient has been using and benefiting from PAP use and will continue to benefit from therapy.  -I will add a chin strap for mouth dryness  HTN -BP is borderline controlled on exam today the patient has documented whitecoat hypertension>>BP at home runs 128-138/70-80's -Continue prescription drug management with HCTZ 25 mg daily, Bystolic 10 mg daily and Benicar 40 mg daily with as needed refills -I have personally reviewed and interpreted outside labs performed by patient's PCP which showed SCr 0.75 and K+ 4.2 on 01/05/2023  Hyperlipidemia -LDL goal <70 due to coronary calcifications -Continue prescription drug management with Repatha, fenofibrate 145 mg daily, fish oil 1 g twice daily with as needed refills -She is statin intolerant -I have personally reviewed and interpreted outside labs performed by patient's PCP which showed LDL 47, TAGs 127 and HDL 45 with ALT 43 on 01/06/2023  Coronary artery calcifications -Coronary calcium score to 248 on 01/12/2022 -She has not had any anginal symptoms -Continue Repatha and fenofibrate -She is statin intolerant. -Start aspirin 81 mg daily  Palpitations -I will get a 2 week ziopatch to assess for arrhythmias  Follow up  in 1 year.  Medication Adjustments/Labs and Tests Ordered: Current medicines are reviewed at length with the patient today.  Concerns regarding medicines are outlined above.  Orders Placed This Encounter  Procedures   EKG 12-Lead   No orders of the defined types were placed in this encounter.  Signed, Armanda Magic, MD  01/29/2023 9:48 AM    [facility]

## 2023-01-29 NOTE — Progress Notes (Unsigned)
Enrolled patient for a 14 day Zio XT  monitor to be mailed to patients home  °

## 2023-02-12 ENCOUNTER — Other Ambulatory Visit: Payer: Self-pay | Admitting: Cardiology

## 2023-02-19 DIAGNOSIS — R011 Cardiac murmur, unspecified: Secondary | ICD-10-CM | POA: Diagnosis not present

## 2023-02-19 DIAGNOSIS — R002 Palpitations: Secondary | ICD-10-CM | POA: Diagnosis not present

## 2023-02-20 ENCOUNTER — Telehealth: Payer: Self-pay

## 2023-02-20 DIAGNOSIS — Z79899 Other long term (current) drug therapy: Secondary | ICD-10-CM

## 2023-02-20 NOTE — Telephone Encounter (Signed)
Called patient to review that heart monitor showed extra heart beats from the top of the heart (PACs/Atrial tachycardia for 4 beats) and extra heart beats from the bottom of the heart (PVCs). Patient verbalizes understanding that these are benign and states she is not inclined to start medication at this time as she rarely feels anything. Patient agrees to have a BMET, MAG and TSH, orders placed and labs scheduled for 02/24/23. Also reviewed that lipid panel looks normal.

## 2023-02-20 NOTE — Telephone Encounter (Signed)
-----   Message from Nurse Mearl Latin sent at 02/19/2023  4:10 PM EDT -----  ----- Message ----- From: Quintella Reichert, MD Sent: 02/19/2023  10:05 AM EDT To: [facility] Triage  Heart monitor showed extra heart beats from the top of the heart (PACs/Atrial tachycardia for 4 beats) and extra heart beats from the bottom of the heart (PVCs).  These are benign.  If they are really bothersome could add on Cardizem.  Please find out what she wants to do. Please have her come in for a BMET, MAG and TSH

## 2023-02-23 ENCOUNTER — Ambulatory Visit: Payer: Medicare PPO | Admitting: Cardiology

## 2023-02-24 ENCOUNTER — Ambulatory Visit: Payer: Medicare PPO | Attending: Cardiology

## 2023-02-24 DIAGNOSIS — Z79899 Other long term (current) drug therapy: Secondary | ICD-10-CM

## 2023-02-25 LAB — BASIC METABOLIC PANEL
BUN/Creatinine Ratio: 23 (ref 12–28)
BUN: 16 mg/dL (ref 8–27)
CO2: 26 mmol/L (ref 20–29)
Calcium: 9.8 mg/dL (ref 8.7–10.3)
Chloride: 102 mmol/L (ref 96–106)
Creatinine, Ser: 0.7 mg/dL (ref 0.57–1.00)
Glucose: 97 mg/dL (ref 70–99)
Potassium: 3.8 mmol/L (ref 3.5–5.2)
Sodium: 141 mmol/L (ref 134–144)
eGFR: 95 mL/min/{1.73_m2} (ref >59)

## 2023-02-25 LAB — MAGNESIUM: Magnesium: 1.6 mg/dL (ref 1.6–2.3)

## 2023-02-25 LAB — TSH: TSH: 1.59 u[IU]/mL (ref 0.450–4.500)

## 2023-02-26 ENCOUNTER — Telehealth: Payer: Self-pay

## 2023-02-26 DIAGNOSIS — Z79899 Other long term (current) drug therapy: Secondary | ICD-10-CM

## 2023-02-26 MED ORDER — MAGNESIUM OXIDE 400 MG PO CAPS: 400.0000 mg | ORAL_CAPSULE | Freq: Every day | ORAL | 0 refills | Status: AC

## 2023-02-26 NOTE — Telephone Encounter (Signed)
Call to advise patient that TSH ok but Mag level low - Dr. Mayford Knife advises to start Mag oxide 400mg  daily and repeat Mag level in 1 week. Patient verbalizes understanding and agrees to oplan.   Patient states that we called her last week about her heart monitor results and she was told: "Heart monitor showed extra heart beats from the top of the heart (PACs/Atrial tachycardia for 4 beats) and extra heart beats from the bottom of the heart (PVCs).  These are benign.  If they are really bothersome could add on Cardizem.  Please find out what she wants to do. Please have her come in for a BMET, MAG and TSH"  She states that at the time she deferred starting cardizem but now she would like to know if having a problem with magnesium means she should go ahead and start cardizem. Patient responses forwarded to Dr. Mayford Knife.

## 2023-02-26 NOTE — Telephone Encounter (Signed)
-----   Message from Armanda Magic sent at 02/25/2023  8:40 PM EDT ----- TSH ok but Mag level low - please start Mag oxide 400mg  daily and repeat Mag level in 1 week

## 2023-03-02 ENCOUNTER — Other Ambulatory Visit: Payer: Self-pay | Admitting: Pharmacist

## 2023-03-02 MED ORDER — OZEMPIC (2 MG/DOSE) 8 MG/3ML ~~LOC~~ SOPN: 2.0000 mg | PEN_INJECTOR | SUBCUTANEOUS | 11 refills | Status: DC

## 2023-03-03 NOTE — Telephone Encounter (Signed)
Call to patient to advise that Dr. Malachy Mood states the low magnesium may be causing her extra heartbeats. Dr. Mayford Knife prefers to see how she does after starting the magnesium supplements via the scheduled lab work before adding on cardizem. Patient verbalizes understanding and agrees to plan.

## 2023-03-06 ENCOUNTER — Ambulatory Visit: Payer: Medicare PPO | Attending: Cardiology

## 2023-03-06 DIAGNOSIS — Z79899 Other long term (current) drug therapy: Secondary | ICD-10-CM | POA: Diagnosis not present

## 2023-03-06 LAB — MAGNESIUM: Magnesium: 1.9 mg/dL (ref 1.6–2.3)

## 2023-03-14 ENCOUNTER — Other Ambulatory Visit: Payer: Self-pay | Admitting: Cardiology

## 2023-04-20 DIAGNOSIS — G4733 Obstructive sleep apnea (adult) (pediatric): Secondary | ICD-10-CM | POA: Diagnosis not present

## 2023-04-21 ENCOUNTER — Telehealth: Payer: Self-pay | Admitting: *Deleted

## 2023-04-21 DIAGNOSIS — I10 Essential (primary) hypertension: Secondary | ICD-10-CM

## 2023-04-21 DIAGNOSIS — G4733 Obstructive sleep apnea (adult) (pediatric): Secondary | ICD-10-CM

## 2023-04-21 NOTE — Telephone Encounter (Signed)
Chin strap order placed to [facility] via community message

## 2023-04-21 NOTE — Telephone Encounter (Signed)
-----   Message from Nurse Alcario Drought E sent at 01/29/2023 10:09 AM EDT ----- Regarding: Cherlynn Polo Dr. Mayford Knife would like to order a chin strap for mouth dryness.  Thanks, Alcario Drought

## 2023-07-06 DIAGNOSIS — K7581 Nonalcoholic steatohepatitis (NASH): Secondary | ICD-10-CM | POA: Diagnosis not present

## 2023-07-06 DIAGNOSIS — E1122 Type 2 diabetes mellitus with diabetic chronic kidney disease: Secondary | ICD-10-CM | POA: Diagnosis not present

## 2023-07-06 DIAGNOSIS — M1991 Primary osteoarthritis, unspecified site: Secondary | ICD-10-CM | POA: Diagnosis not present

## 2023-07-06 DIAGNOSIS — J309 Allergic rhinitis, unspecified: Secondary | ICD-10-CM | POA: Diagnosis not present

## 2023-07-06 DIAGNOSIS — N182 Chronic kidney disease, stage 2 (mild): Secondary | ICD-10-CM | POA: Diagnosis not present

## 2023-07-06 DIAGNOSIS — I10 Essential (primary) hypertension: Secondary | ICD-10-CM | POA: Diagnosis not present

## 2023-07-06 DIAGNOSIS — Z6836 Body mass index (BMI) 36.0-36.9, adult: Secondary | ICD-10-CM | POA: Diagnosis not present

## 2023-07-06 DIAGNOSIS — E785 Hyperlipidemia, unspecified: Secondary | ICD-10-CM | POA: Diagnosis not present

## 2023-07-06 DIAGNOSIS — M25761 Osteophyte, right knee: Secondary | ICD-10-CM | POA: Diagnosis not present

## 2023-07-07 ENCOUNTER — Other Ambulatory Visit: Payer: Self-pay | Admitting: Cardiology

## 2023-07-07 DIAGNOSIS — R5381 Other malaise: Secondary | ICD-10-CM

## 2023-07-08 DIAGNOSIS — H35033 Hypertensive retinopathy, bilateral: Secondary | ICD-10-CM | POA: Diagnosis not present

## 2023-07-08 DIAGNOSIS — E11319 Type 2 diabetes mellitus with unspecified diabetic retinopathy without macular edema: Secondary | ICD-10-CM | POA: Diagnosis not present

## 2023-07-08 DIAGNOSIS — H2513 Age-related nuclear cataract, bilateral: Secondary | ICD-10-CM | POA: Diagnosis not present

## 2023-07-08 DIAGNOSIS — H524 Presbyopia: Secondary | ICD-10-CM | POA: Diagnosis not present

## 2023-07-08 DIAGNOSIS — I10 Essential (primary) hypertension: Secondary | ICD-10-CM | POA: Diagnosis not present

## 2023-07-08 DIAGNOSIS — H04203 Unspecified epiphora, bilateral lacrimal glands: Secondary | ICD-10-CM | POA: Diagnosis not present

## 2023-07-13 ENCOUNTER — Other Ambulatory Visit: Payer: Self-pay | Admitting: Obstetrics and Gynecology

## 2023-07-13 ENCOUNTER — Telehealth: Payer: Self-pay | Admitting: *Deleted

## 2023-07-13 DIAGNOSIS — R5381 Other malaise: Secondary | ICD-10-CM

## 2023-07-13 DIAGNOSIS — Z780 Asymptomatic menopausal state: Secondary | ICD-10-CM

## 2023-07-13 DIAGNOSIS — E2839 Other primary ovarian failure: Secondary | ICD-10-CM

## 2023-07-13 NOTE — Telephone Encounter (Signed)
[name] called from [facility].  Patient is scheduled for BMD 07/15/23.  BMD order needs updated Dx code.  Return call to [phone] ex [phone] once completed.   Current Dx menopause  Dr. Edward Jolly -please review and confirm Dx code for BMD.

## 2023-07-13 NOTE — Telephone Encounter (Signed)
Code estrogen deficiency can be used.

## 2023-07-14 DIAGNOSIS — K7689 Other specified diseases of liver: Secondary | ICD-10-CM | POA: Diagnosis not present

## 2023-07-14 DIAGNOSIS — K7581 Nonalcoholic steatohepatitis (NASH): Secondary | ICD-10-CM | POA: Diagnosis not present

## 2023-07-14 DIAGNOSIS — K831 Obstruction of bile duct: Secondary | ICD-10-CM | POA: Diagnosis not present

## 2023-07-14 NOTE — Telephone Encounter (Signed)
BMD order placed.   Call returned to [facility], left message advising new order placed.   Encounter closed.

## 2023-07-15 ENCOUNTER — Ambulatory Visit
Admission: RE | Admit: 2023-07-15 | Discharge: 2023-07-15 | Discharge status: Home / Self Care | Payer: Medicare PPO | Source: Ambulatory Visit | Attending: Cardiology | Admitting: Cardiology

## 2023-07-15 DIAGNOSIS — Z780 Asymptomatic menopausal state: Secondary | ICD-10-CM

## 2023-07-15 DIAGNOSIS — N958 Other specified menopausal and perimenopausal disorders: Secondary | ICD-10-CM | POA: Diagnosis not present

## 2023-07-15 DIAGNOSIS — Z90722 Acquired absence of ovaries, bilateral: Secondary | ICD-10-CM | POA: Diagnosis not present

## 2023-07-15 DIAGNOSIS — E2839 Other primary ovarian failure: Secondary | ICD-10-CM | POA: Diagnosis not present

## 2023-07-21 ENCOUNTER — Encounter: Payer: Self-pay | Admitting: Obstetrics and Gynecology

## 2023-07-22 DIAGNOSIS — G4733 Obstructive sleep apnea (adult) (pediatric): Secondary | ICD-10-CM | POA: Diagnosis not present

## 2023-07-27 DIAGNOSIS — Z1231 Encounter for screening mammogram for malignant neoplasm of breast: Secondary | ICD-10-CM | POA: Diagnosis not present

## 2023-07-29 ENCOUNTER — Encounter: Payer: Self-pay | Admitting: Obstetrics and Gynecology

## 2023-08-03 DIAGNOSIS — I788 Other diseases of capillaries: Secondary | ICD-10-CM | POA: Diagnosis not present

## 2023-08-03 DIAGNOSIS — L821 Other seborrheic keratosis: Secondary | ICD-10-CM | POA: Diagnosis not present

## 2023-08-03 DIAGNOSIS — D692 Other nonthrombocytopenic purpura: Secondary | ICD-10-CM | POA: Diagnosis not present

## 2023-08-03 DIAGNOSIS — D2272 Melanocytic nevi of left lower limb, including hip: Secondary | ICD-10-CM | POA: Diagnosis not present

## 2023-08-03 DIAGNOSIS — L820 Inflamed seborrheic keratosis: Secondary | ICD-10-CM | POA: Diagnosis not present

## 2023-08-03 DIAGNOSIS — D225 Melanocytic nevi of trunk: Secondary | ICD-10-CM | POA: Diagnosis not present

## 2023-09-18 DIAGNOSIS — M150 Primary generalized (osteo)arthritis: Secondary | ICD-10-CM | POA: Diagnosis not present

## 2023-10-22 DIAGNOSIS — I10 Essential (primary) hypertension: Secondary | ICD-10-CM | POA: Diagnosis not present

## 2023-10-22 DIAGNOSIS — G4733 Obstructive sleep apnea (adult) (pediatric): Secondary | ICD-10-CM | POA: Diagnosis not present

## 2023-11-23 DIAGNOSIS — I129 Hypertensive chronic kidney disease with stage 1 through stage 4 chronic kidney disease, or unspecified chronic kidney disease: Secondary | ICD-10-CM | POA: Diagnosis not present

## 2023-11-23 DIAGNOSIS — K7402 Hepatic fibrosis, advanced fibrosis: Secondary | ICD-10-CM | POA: Diagnosis not present

## 2023-11-23 DIAGNOSIS — K760 Fatty (change of) liver, not elsewhere classified: Secondary | ICD-10-CM | POA: Diagnosis not present

## 2023-11-23 DIAGNOSIS — Z6836 Body mass index (BMI) 36.0-36.9, adult: Secondary | ICD-10-CM | POA: Diagnosis not present

## 2023-11-23 DIAGNOSIS — E669 Obesity, unspecified: Secondary | ICD-10-CM | POA: Diagnosis not present

## 2023-11-23 DIAGNOSIS — R7303 Prediabetes: Secondary | ICD-10-CM | POA: Diagnosis not present

## 2023-11-23 DIAGNOSIS — Z1159 Encounter for screening for other viral diseases: Secondary | ICD-10-CM | POA: Diagnosis not present

## 2023-11-23 DIAGNOSIS — N189 Chronic kidney disease, unspecified: Secondary | ICD-10-CM | POA: Diagnosis not present

## 2023-11-23 DIAGNOSIS — R748 Abnormal levels of other serum enzymes: Secondary | ICD-10-CM | POA: Diagnosis not present

## 2023-12-21 ENCOUNTER — Other Ambulatory Visit: Payer: Self-pay | Admitting: Pharmacist

## 2023-12-21 DIAGNOSIS — K760 Fatty (change of) liver, not elsewhere classified: Secondary | ICD-10-CM | POA: Diagnosis not present

## 2023-12-29 DIAGNOSIS — Z96653 Presence of artificial knee joint, bilateral: Secondary | ICD-10-CM | POA: Diagnosis not present

## 2023-12-29 DIAGNOSIS — M170 Bilateral primary osteoarthritis of knee: Secondary | ICD-10-CM | POA: Diagnosis not present

## 2024-01-04 DIAGNOSIS — Z6836 Body mass index (BMI) 36.0-36.9, adult: Secondary | ICD-10-CM | POA: Diagnosis not present

## 2024-01-04 DIAGNOSIS — K7581 Nonalcoholic steatohepatitis (NASH): Secondary | ICD-10-CM | POA: Diagnosis not present

## 2024-01-04 DIAGNOSIS — E1122 Type 2 diabetes mellitus with diabetic chronic kidney disease: Secondary | ICD-10-CM | POA: Diagnosis not present

## 2024-01-04 DIAGNOSIS — E785 Hyperlipidemia, unspecified: Secondary | ICD-10-CM | POA: Diagnosis not present

## 2024-01-04 DIAGNOSIS — I10 Essential (primary) hypertension: Secondary | ICD-10-CM | POA: Diagnosis not present

## 2024-01-04 DIAGNOSIS — N182 Chronic kidney disease, stage 2 (mild): Secondary | ICD-10-CM | POA: Diagnosis not present

## 2024-01-04 DIAGNOSIS — J309 Allergic rhinitis, unspecified: Secondary | ICD-10-CM | POA: Diagnosis not present

## 2024-01-04 DIAGNOSIS — M25761 Osteophyte, right knee: Secondary | ICD-10-CM | POA: Diagnosis not present

## 2024-01-04 DIAGNOSIS — M1991 Primary osteoarthritis, unspecified site: Secondary | ICD-10-CM | POA: Diagnosis not present

## 2024-01-08 DIAGNOSIS — Z01812 Encounter for preprocedural laboratory examination: Secondary | ICD-10-CM | POA: Diagnosis not present

## 2024-01-08 DIAGNOSIS — T84039A Mechanical loosening of unspecified internal prosthetic joint, initial encounter: Secondary | ICD-10-CM | POA: Diagnosis not present

## 2024-01-14 DIAGNOSIS — E119 Type 2 diabetes mellitus without complications: Secondary | ICD-10-CM | POA: Diagnosis not present

## 2024-01-14 DIAGNOSIS — G4733 Obstructive sleep apnea (adult) (pediatric): Secondary | ICD-10-CM | POA: Diagnosis not present

## 2024-01-14 DIAGNOSIS — M1711 Unilateral primary osteoarthritis, right knee: Secondary | ICD-10-CM | POA: Diagnosis not present

## 2024-01-14 DIAGNOSIS — Z96651 Presence of right artificial knee joint: Secondary | ICD-10-CM | POA: Diagnosis not present

## 2024-01-14 DIAGNOSIS — M109 Gout, unspecified: Secondary | ICD-10-CM | POA: Diagnosis not present

## 2024-01-14 DIAGNOSIS — I129 Hypertensive chronic kidney disease with stage 1 through stage 4 chronic kidney disease, or unspecified chronic kidney disease: Secondary | ICD-10-CM | POA: Diagnosis not present

## 2024-01-14 DIAGNOSIS — G8918 Other acute postprocedural pain: Secondary | ICD-10-CM | POA: Diagnosis not present

## 2024-01-14 DIAGNOSIS — T84032A Mechanical loosening of internal right knee prosthetic joint, initial encounter: Secondary | ICD-10-CM | POA: Diagnosis not present

## 2024-01-14 DIAGNOSIS — M810 Age-related osteoporosis without current pathological fracture: Secondary | ICD-10-CM | POA: Diagnosis not present

## 2024-01-14 DIAGNOSIS — K219 Gastro-esophageal reflux disease without esophagitis: Secondary | ICD-10-CM | POA: Diagnosis not present

## 2024-01-14 DIAGNOSIS — E1122 Type 2 diabetes mellitus with diabetic chronic kidney disease: Secondary | ICD-10-CM | POA: Diagnosis not present

## 2024-01-14 DIAGNOSIS — E7800 Pure hypercholesterolemia, unspecified: Secondary | ICD-10-CM | POA: Diagnosis not present

## 2024-01-14 DIAGNOSIS — I10 Essential (primary) hypertension: Secondary | ICD-10-CM | POA: Diagnosis not present

## 2024-01-14 DIAGNOSIS — Z4789 Encounter for other orthopedic aftercare: Secondary | ICD-10-CM | POA: Diagnosis not present

## 2024-01-19 ENCOUNTER — Encounter: Admitting: Obstetrics and Gynecology

## 2024-01-19 DIAGNOSIS — M25661 Stiffness of right knee, not elsewhere classified: Secondary | ICD-10-CM | POA: Diagnosis not present

## 2024-01-19 DIAGNOSIS — M25561 Pain in right knee: Secondary | ICD-10-CM | POA: Diagnosis not present

## 2024-01-20 DIAGNOSIS — G4733 Obstructive sleep apnea (adult) (pediatric): Secondary | ICD-10-CM | POA: Diagnosis not present

## 2024-01-20 DIAGNOSIS — I10 Essential (primary) hypertension: Secondary | ICD-10-CM | POA: Diagnosis not present

## 2024-01-21 DIAGNOSIS — M25661 Stiffness of right knee, not elsewhere classified: Secondary | ICD-10-CM | POA: Diagnosis not present

## 2024-01-21 DIAGNOSIS — M25561 Pain in right knee: Secondary | ICD-10-CM | POA: Diagnosis not present

## 2024-01-26 DIAGNOSIS — M25661 Stiffness of right knee, not elsewhere classified: Secondary | ICD-10-CM | POA: Diagnosis not present

## 2024-01-26 DIAGNOSIS — M25561 Pain in right knee: Secondary | ICD-10-CM | POA: Diagnosis not present

## 2024-01-28 DIAGNOSIS — M25661 Stiffness of right knee, not elsewhere classified: Secondary | ICD-10-CM | POA: Diagnosis not present

## 2024-01-28 DIAGNOSIS — M25561 Pain in right knee: Secondary | ICD-10-CM | POA: Diagnosis not present

## 2024-02-01 ENCOUNTER — Telehealth: Payer: Self-pay | Admitting: Cardiology

## 2024-02-01 DIAGNOSIS — R5383 Other fatigue: Secondary | ICD-10-CM | POA: Diagnosis not present

## 2024-02-01 DIAGNOSIS — R748 Abnormal levels of other serum enzymes: Secondary | ICD-10-CM | POA: Diagnosis not present

## 2024-02-01 DIAGNOSIS — K760 Fatty (change of) liver, not elsewhere classified: Secondary | ICD-10-CM | POA: Diagnosis not present

## 2024-02-01 DIAGNOSIS — E669 Obesity, unspecified: Secondary | ICD-10-CM | POA: Diagnosis not present

## 2024-02-01 DIAGNOSIS — R7303 Prediabetes: Secondary | ICD-10-CM | POA: Diagnosis not present

## 2024-02-01 DIAGNOSIS — M1991 Primary osteoarthritis, unspecified site: Secondary | ICD-10-CM | POA: Diagnosis not present

## 2024-02-01 DIAGNOSIS — I10 Essential (primary) hypertension: Secondary | ICD-10-CM | POA: Diagnosis not present

## 2024-02-01 DIAGNOSIS — Z23 Encounter for immunization: Secondary | ICD-10-CM | POA: Diagnosis not present

## 2024-02-01 DIAGNOSIS — K7402 Hepatic fibrosis, advanced fibrosis: Secondary | ICD-10-CM | POA: Diagnosis not present

## 2024-02-01 NOTE — Telephone Encounter (Signed)
 Spoke to patient she was told she has a fatty liver.She was told by liver specialist Mounjaro  works better than Ozempic  with a fatty liver.Advised I will send message to our pharmacist and to Dr.Turner.

## 2024-02-01 NOTE — Telephone Encounter (Signed)
 Pt c/o medication issue:  1. Name of Medication:  Semaglutide , 2 MG/DOSE, (OZEMPIC , 2 MG/DOSE,) 8 MG/3ML SOPN    2. How are you currently taking this medication (dosage and times per day)?   3. Are you having a reaction (difficulty breathing--STAT)?   4. What is your medication issue?   Patient has been diagnosed with NASH. She sees liver specialist at [facility] who put her on Rezdiffra today. Specialist advised to talk to Dr. Shlomo about switching from Ozempic  to Mounjaro  because it will be more effective.

## 2024-02-02 ENCOUNTER — Other Ambulatory Visit (HOSPITAL_COMMUNITY): Payer: Self-pay

## 2024-02-02 DIAGNOSIS — M25661 Stiffness of right knee, not elsewhere classified: Secondary | ICD-10-CM | POA: Diagnosis not present

## 2024-02-02 DIAGNOSIS — M25561 Pain in right knee: Secondary | ICD-10-CM | POA: Diagnosis not present

## 2024-02-02 MED ORDER — MOUNJARO 10 MG/0.5ML ~~LOC~~ SOAJ: 10.0000 mg | SUBCUTANEOUS | 0 refills | Status: DC

## 2024-02-02 NOTE — Telephone Encounter (Signed)
  No PA required, cost $0.

## 2024-02-04 DIAGNOSIS — M25561 Pain in right knee: Secondary | ICD-10-CM | POA: Diagnosis not present

## 2024-02-04 DIAGNOSIS — M25661 Stiffness of right knee, not elsewhere classified: Secondary | ICD-10-CM | POA: Diagnosis not present

## 2024-02-09 DIAGNOSIS — M25561 Pain in right knee: Secondary | ICD-10-CM | POA: Diagnosis not present

## 2024-02-09 DIAGNOSIS — M25661 Stiffness of right knee, not elsewhere classified: Secondary | ICD-10-CM | POA: Diagnosis not present

## 2024-02-10 NOTE — Progress Notes (Unsigned)
 Cardiology Office Note:    Date:  02/11/2024   ID:  Ann Fernandez, DOB 1956-04-21, MRN [id number]  PCP:  Cathlyn JAYSON Nikki Bobie FORBES, MD  Cardiologist:  Wilbert Bihari, MD   Referring MD: Cathlyn JAYSON Nikki Bobie*   Reason for visit: OSA and HTN  History of Present Illness:    Ann Fernandez is a 68 y.o. female with a hx of mild OSA , hypertension, coronary artery calcifications with a coronary calcium  score of 248 in 2023, hyperlipidemia and obesity who is on CPAP.  She is here today for follow-up and is doing well.  She denies any chest pain or shortness of breath, PND, dizziness, palpitations,or syncope.  SHe recently had a TKR 4 weeks ago and has some residual knee and RLE edema from that.   She is doing well with her PAP device and thinks that she she has gotten used to it.  She tolerates the nasal mask and feels the pressure is adequate.  Since going on PAP she feels rested in the am and has no significant daytime sleepiness.  She denies any significant nasal dryness or nasal congestion. She has a lot of mouth dryness but has not been using her chin strap. She does not think that he snores. An Epworth Sleepiness Scale score was calculated the office today and this endorsed at 2 arguing against residual daytime sleepiness. Patient denies any episodes of bruxism, restless legs, No gagging hallucinations or cataplectic events.     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 Breast, lumpectomy, Left - ATYPICAL DUCTAL HYPERPLASIA, SEE COMMENT. - SURGICAL MARGINS, NEGATIVE FOR ATYPIA OR MALIGNANCY. - PREVIOUS BIOPSY SITE IDENTIFIED. - ONE LYMPH NODE, NEGATIVE FOR  TUMOR (0/1), 2. Breast, excision, Left medial margin - BENIGN BREAST TISSUE, SEE COMMENT. - NEGATIVE FOR ATYPIA OR MALIGNANCY.    DDD (degenerative disc disease)    Diabetes mellitus without complication (HCC)    DIVERTICULOSIS, COLON 12/21/2006   Qualifier: Diagnosis of  By: Tammie MD, Bruce     DOE (dyspnea on exertion) 01/20/2014   Encounter for long-term (current) use of other medications 09/26/2011   Essential hypertension 12/21/2006     Pt states she has a white coat syndrome and her BP is OK at home.      Family history of adverse reaction to anesthesia    Anesthesia said they woke her mother up too fast and developed A-Fib .   GERD (gastroesophageal reflux disease)    Gout    Hearing loss    HYPERLIPIDEMIA 12/21/2006   Qualifier: Diagnosis of  By: Tammie MD, Bruce     IBS (irritable bowel syndrome)    Incarcerated incisional hernia s/p lap repair w mesh 10/02/2017 10/02/2017   Obesity (BMI 30-39.9) 06/27/2014   OSA (obstructive sleep apnea) 03/24/2014   Mild OSA with AHI 12.4/hr now on CPAP at 10cm H2O.  Persistent oxygen desats <88% for 11 minutes on CPAP and now on nighttime O2 at 2L via CPAP    PONV (postoperative nausea and vomiting)    Position carefully due to [facility] back surgeries   Right ovarian cyst 09/12/2017  Wears glasses     Past Surgical History:  Procedure Laterality Date   ABDOMINAL HYSTERECTOMY  1994   TVH for DUB--ovaries retained     ANTERIOR LUMBAR FUSION     ANTERIOR LUMBAR FUSION  2018   [facility] Dr Leobardo.  2 level ALIF at L4-5 and L5-S1   BACK SURGERY  2011   lumb cyst   BACK SURGERY  04/18/2016   Anterior and posterior spinal fusion   BREAST SURGERY     CERVICAL LAMINECTOMY     COLONOSCOPY  09/09/2012   CYST REMOVAL TRUNK     LOWER BACK   DILATION AND CURETTAGE OF UTERUS     INSERTION OF MESH N/A 10/02/2017   Procedure: INSERTION OF MESH;  Surgeon: Sheldon Standing, MD;  Location: [facility];  Service: General;  Laterality: N/A;    JOINT REPLACEMENT     partial right knee   KNEE ARTHROSCOPY     KNEE ARTHROSCOPY Right 05/25/2013   Procedure: RIGHT ARTHROSCOPY KNEE, PARTIAL MEDIAL MENISCECTOMY, CHONDROPLASTY, PARTIAL LATERAL MENISCECTOMY;  Surgeon: Dempsey LULLA Moan, MD;  Location: [facility];  Service: Orthopedics;  Laterality: Right;   KNEE SURGERY     left knee    LAPAROSCOPIC SALPINGO OOPHERECTOMY Bilateral 10/02/2017   Procedure: LAPAROSCOPIC SALPINGO OOPHORECTOMY, CYSTOSCOPY;  Surgeon: Cathlyn JAYSON Nikki Bobie FORBES, MD;  Location: [facility];  Service: Gynecology;  Laterality: Bilateral;   PARTIAL MASTECTOMY WITH NEEDLE LOCALIZATION Left 06/03/2013   Procedure: PARTIAL MASTECTOMY WITH NEEDLE LOCALIZATION;  Surgeon: Elon CHRISTELLA Pacini, MD;  Location: [facility];  Service: General;  Laterality: Left;   POSTERIOR LUMBAR FUSION     RECTOCELE REPAIR N/A 09/27/2019   Procedure: POSTERIOR REPAIR (RECTOCELE)and  Enterocele;  Surgeon: Cathlyn JAYSON Nikki Bobie FORBES, MD;  Location: [facility];  Service: Gynecology;  Laterality: N/A;   SINUS IRRIGATION     TONSILLECTOMY     TRIGGER FINGER RELEASE     VENTRAL HERNIA REPAIR N/A 10/02/2017   Procedure: LAPAROSCOPIC VENTRAL WALL ASSISTED  HERNIA REPAIR WITH MESH;  Surgeon: Sheldon Standing, MD;  Location:  SURGERY CENTER;  Service: General;  Laterality: N/A;   WISDOM TOOTH EXTRACTION      Current Medications: Current Meds  Medication Sig   allopurinol (ZYLOPRIM) 100 MG tablet Take 2 tablets by mouth daily.   calcium -vitamin D  (OSCAL WITH D) 500-200 MG-UNIT per tablet Take 1 tablet by mouth daily with breakfast.    fenofibrate  (TRICOR ) 145 MG tablet Take 1 tablet (145 mg total) by mouth daily.   fexofenadine (ALLEGRA) 180 MG tablet Take 180 mg by mouth every evening.   fish oil-omega-3 fatty acids 1000 MG capsule Take 1 g by mouth 2 (two) times daily.    fluticasone  (FLONASE ) 50 MCG/ACT nasal spray as needed.   gabapentin  (NEURONTIN ) 300 MG  capsule Take by mouth as needed.   hydrochlorothiazide  (HYDRODIURIL ) 25 MG tablet TAKE ONE TABLET BY MOUTH ONCE DAILY.   Magnesium  Oxide 400 MG CAPS Take 1 capsule (400 mg total) by mouth daily.   meclizine  (ANTIVERT ) 25 MG tablet Take 25 mg by mouth 3 (three) times daily as needed for dizziness.    meloxicam (MOBIC) 15 MG tablet Take 15 mg by mouth daily.   metFORMIN  (GLUCOPHAGE ) 500 MG tablet Take 500 mg by mouth every evening.    methylcellulose oral powder Take 1 packet by mouth as needed.    mometasone  (NASONEX ) 50 MCG/ACT nasal spray Place 2 sprays into the nose  at bedtime.    nebivolol  (BYSTOLIC ) 10 MG tablet TAKE (1) TABLET BY MOUTH ONCE DAILY.   olmesartan (BENICAR) 40 MG tablet Take 40 mg by mouth daily.   omeprazole (PRILOSEC) 20 MG capsule Take 20 mg by mouth daily as needed (for acid reflux).    REPATHA  SURECLICK 140 MG/ML SOAJ Inject 140 mg into the skin every 14 (fourteen) days.   Resmetirom (REZDIFFRA) 60 MG TABS Take 1 tablet by mouth daily.   tirzepatide  (MOUNJARO ) 10 MG/0.5ML Pen Inject 10 mg into the skin once a week.     Allergies:   Demerol  [meperidine ], Penicillins, Sulfa antibiotics, Sulfasalazine, Triamterene, Amlodipine besylate, Arimidex  [anastrozole ], Lipitor [atorvastatin], Lisinopril, Propoxyphene, Simvastatin, and Topiramate   Social History   Socioeconomic History   Marital status: Married    Spouse name: Not on file   Number of children: Not on file   Years of education: Not on file   Highest education level: Not on file  Occupational History   Not on file  Tobacco Use   Smoking status: Never   Smokeless tobacco: Never  Vaping Use   Vaping status: Never Used  Substance and Sexual Activity   Alcohol use: No   Drug use: No   Sexual activity: Yes    Partners: Male    Comment: TVH  Other Topics Concern   Not on file  Social History Narrative   Not on file   Social Drivers of Health   Financial Resource Strain: Not on file  Food Insecurity:  Unknown (09/18/2023)   [facility] from [facility]   Hunger Vital Sign    Worried About Running Out of Food in the Last Year: Not on file    Within the past 12 months, the food you bought just didn't last and you didn't have money to get more.: Never true  Transportation Needs: No Transportation Needs (09/18/2023)   Received from [facility] - Transportation    In the past 12 months, has lack of transportation kept you from medical appointments or from getting medications?: No    Lack of Transportation (Non-Medical): No  Physical Activity: Not on file  Stress: Not on file  Social Connections: Not on file     Family History: The patient's family history includes COPD in her father; Colon cancer in her mother; Colon polyps in her father and mother; Deep vein thrombosis in her mother; Heart disease in her brother, father, and mother; Lung disease in her mother; Osteoarthritis in her father; Pulmonary embolism in her brother; Rectal cancer (age of onset: 52) in her mother; Rheum arthritis in her daughter and father. There is no history of Esophageal cancer or Stomach cancer.  ROS:   Please see the history of present illness.    ROS  All other systems reviewed and negative.   EKGs/Labs/Other Studies Reviewed:    The following studies were reviewed today: PAP download   EKG Interpretation Date/Time:  Thursday February 11 2024 09:28:03 EDT Ventricular Rate:  67 PR Interval:  202 QRS Duration:  78 QT Interval:  402 QTC Calculation: 424 R Axis:   -41  Text Interpretation: Normal sinus rhythm Left axis deviation Low voltage QRS Cannot rule out Anterior infarct , age undetermined When compared with ECG of 29-Jan-2023 09:16, No significant change was found Confirmed by Shlomo Corning [phone]) on 02/11/2024 9:35:24 AM  EKG Interpretation Date/Time:  Thursday February 11 2024 09:28:03 EDT Ventricular Rate:  67 PR Interval:  202 QRS  Duration:  78 QT  Interval:  402 QTC Calculation: 424 R Axis:   -41  Text Interpretation: Normal sinus rhythm Left axis deviation Low voltage QRS Cannot rule out Anterior infarct , age undetermined When compared with ECG of 29-Jan-2023 09:16, No significant change was found Confirmed by Shlomo Corning [phone]) on 02/11/2024 9:35:24 AM   . Recent Labs: 02/24/2023: BUN 16; Creatinine, Ser 0.70; Potassium 3.8; Sodium 141; TSH 1.590 03/06/2023: Magnesium  1.9   Recent Lipid Panel    Component Value Date/Time   CHOL 152 04/04/2021 0856   TRIG 114 04/04/2021 0856   HDL 45 04/04/2021 0856   CHOLHDL 3.4 04/04/2021 0856   CHOLHDL 4.4 10/12/2015 1114   VLDL 44 (H) 10/12/2015 1114   LDLCALC 86 04/04/2021 0856   LDLDIRECT 172.8 06/29/2012 1003    Physical Exam:    VS:  BP (!) 176/84   Pulse 67   Ht 5' 5 (1.651 m)   Wt 220 lb 9.6 oz (100.1 kg)   LMP 05/26/1993 (Exact Date)   SpO2 99%   BMI 36.71 kg/m     Wt Readings from Last 3 Encounters:  02/11/24 220 lb 9.6 oz (100.1 kg)  01/29/23 217 lb (98.4 kg)  01/23/22 219 lb 6.4 oz (99.5 kg)    GEN: Well nourished, well developed in no acute distress HEENT: Normal NECK: No JVD; No carotid bruits LYMPHATICS: No lymphadenopathy CARDIAC:RRR, no rubs, gallops 2/6 SM at RUSB RESPIRATORY:  Clear to auscultation without rales, wheezing or rhonchi  ABDOMEN: Soft, non-tender, non-distended MUSCULOSKELETAL:  No edema; No deformity  SKIN: Warm and dry NEUROLOGIC:  Alert and oriented x 3 PSYCHIATRIC:  Normal affect  ASSESSMENT:    1. OSA on CPAP   2. Essential hypertension   3. Other hyperlipidemia   4. Agatston coronary artery calcium  score between 200 and 399   5. Palpitations   6. Heart murmur       PLAN:    In order of problems listed above:  OSA - The patient is tolerating PAP therapy well without any problems. The PAP download performed by his DME was personally reviewed and interpreted by me today and showed an AHI of 0.8 /hr on 8 cm H2O with 93%  compliance in using more than 4 hours nightly.  The patient has been using and benefiting from PAP use and will continue to benefit from therapy.  -encouraged her to use her chin strap to help with mouth dryness  HTN -BP is poorly controlled on exam today>>she has white coat HTN and gets very anxious going to see the doctor.  At home her BP runs 120-130/70's at home -Continue HCTZ 25 mg daily, Bystolic  10 mg daily, Benicar 40 mg daily as needed refills -check BMET  Hyperlipidemia Fatty Liver -LDL goal <70 due to coronary calcifications -She is statin intolerant -Continue Repatha  140 mg injection every 14 days and Tricor  145 mg daily with PRN refills -Check FLP and ALT -she is followed at [facility] for fatter liver  Coronary artery calcifications -Coronary calcium  score to 248 on 01/12/2022 -She denies any anginal symptoms -she has not had a stress test since 2015 so will get a Stress PET CT to rule out silent ischemia -Informed Consent   Shared Decision Making/Informed Consent The risks [chest pain, shortness of breath, cardiac arrhythmias, dizziness, blood pressure fluctuations, myocardial infarction, stroke/transient ischemic attack, nausea, vomiting, allergic reaction, radiation exposure, metallic taste sensation and life-threatening complications (estimated to be 1 in 10,000)], benefits (risk stratification, diagnosing coronary  artery disease, treatment guidance) and alternatives of a cardiac PET stress test were discussed in detail with Ms. Jian and she agrees to proceed. -She is statin intolerant. -Continue aspirin 81 mg daily, Repatha , fenofibrate  45 mg daily with PRN Refills  Palpitations - She wore a 2-week Zio patch 02/19/2023 showing sinus rhythm with 4 beat run of atrial tachycardia and rare PACs and PVCs - She has not had any further palpitations on Bystolic   Heart Murmur -she had a murmur in 2017 with normal echo -will repeat echo to reassess murmur  Follow up in 1  year.  Medication Adjustments/Labs and Tests Ordered: Current medicines are reviewed at length with the patient today.  Concerns regarding medicines are outlined above.  Orders Placed This Encounter  Procedures   EKG 12-Lead   No orders of the defined types were placed in this encounter.  Signed, Wilbert Bihari, MD  02/11/2024 9:45 AM    [facility]

## 2024-02-11 ENCOUNTER — Ambulatory Visit: Attending: Cardiology | Admitting: Cardiology

## 2024-02-11 ENCOUNTER — Encounter: Payer: Self-pay | Admitting: Cardiology

## 2024-02-11 VITALS — BP 176/84 | HR 67 | Ht 65.0 in | Wt 220.6 lb

## 2024-02-11 DIAGNOSIS — I10 Essential (primary) hypertension: Secondary | ICD-10-CM | POA: Diagnosis not present

## 2024-02-11 DIAGNOSIS — R011 Cardiac murmur, unspecified: Secondary | ICD-10-CM | POA: Diagnosis not present

## 2024-02-11 DIAGNOSIS — R931 Abnormal findings on diagnostic imaging of heart and coronary circulation: Secondary | ICD-10-CM | POA: Diagnosis not present

## 2024-02-11 DIAGNOSIS — G4733 Obstructive sleep apnea (adult) (pediatric): Secondary | ICD-10-CM

## 2024-02-11 DIAGNOSIS — R002 Palpitations: Secondary | ICD-10-CM

## 2024-02-11 DIAGNOSIS — Z79899 Other long term (current) drug therapy: Secondary | ICD-10-CM | POA: Diagnosis not present

## 2024-02-11 DIAGNOSIS — E7849 Other hyperlipidemia: Secondary | ICD-10-CM

## 2024-02-11 LAB — COMPREHENSIVE METABOLIC PANEL WITH GFR
ALT: 25 IU/L (ref 0–32)
AST: 22 IU/L (ref 0–40)
Albumin: 4.3 g/dL (ref 3.9–4.9)
Alkaline Phosphatase: 43 IU/L — ABNORMAL LOW (ref 49–135)
BUN/Creatinine Ratio: 22 (ref 12–28)
BUN: 19 mg/dL (ref 8–27)
Bilirubin Total: 0.3 mg/dL (ref 0.0–1.2)
CO2: 22 mmol/L (ref 20–29)
Calcium: 9.7 mg/dL (ref 8.7–10.3)
Chloride: 101 mmol/L (ref 96–106)
Creatinine, Ser: 0.88 mg/dL (ref 0.57–1.00)
Globulin, Total: 2 g/dL (ref 1.5–4.5)
Glucose: 84 mg/dL (ref 70–99)
Potassium: 4.6 mmol/L (ref 3.5–5.2)
Sodium: 138 mmol/L (ref 134–144)
Total Protein: 6.3 g/dL (ref 6.0–8.5)
eGFR: 72 mL/min/1.73 (ref >59)

## 2024-02-11 LAB — LIPID PANEL
Chol/HDL Ratio: 3.9 ratio (ref 0.0–4.4)
Cholesterol, Total: 161 mg/dL (ref 100–199)
HDL: 41 mg/dL (ref >39)
LDL Chol Calc (NIH): 90 mg/dL (ref 0–99)
Triglycerides: 177 mg/dL — ABNORMAL HIGH (ref 0–149)
VLDL Cholesterol Cal: 30 mg/dL (ref 5–40)

## 2024-02-11 NOTE — Patient Instructions (Signed)
 Medication Instructions:  Your physician recommends that you continue on your current medications as directed. Please refer to the Current Medication list given to you today.  *If you need a refill on your cardiac medications before your next appointment, please call your pharmacy*  Lab Work: Please complete a FASTING lipid panel and a CMET today in our first floor lab before you leave.  If you have labs (blood work) drawn today and your tests are completely normal, you will receive your results only by: MyChart Message (if you have MyChart) OR A paper copy in the mail If you have any lab test that is abnormal or we need to change your treatment, we will call you to review the results.  Testing/Procedures: Your physician has requested that you have an echocardiogram. Echocardiography is a painless test that uses sound waves to create images of your heart. It provides your doctor with information about the size and shape of your heart and how well your heart's chambers and valves are working. This procedure takes approximately one hour. There are no restrictions for this procedure. Please do NOT wear cologne, perfume, aftershave, or lotions (deodorant is allowed). Please arrive 15 minutes prior to your appointment time.  Please note: We ask at that you not bring children with you during ultrasound (echo/ vascular) testing. Due to room size and safety concerns, children are not allowed in the ultrasound rooms during exams. Our front office staff cannot provide observation of children in our lobby area while testing is being conducted. An adult accompanying a patient to their appointment will only be allowed in the ultrasound room at the discretion of the ultrasound technician under special circumstances. We apologize for any inconvenience.     Please report to Radiology at the [facility] Main Entrance 30 minutes early for your test.  [address] [place], [place] [address]            How to Prepare for Your Cardiac PET/CT Stress Test:  Nothing to eat or drink, except water , 3 hours prior to arrival time.  NO caffeine/decaffeinated products, or chocolate 12 hours prior to arrival. (Please note decaffeinated beverages (teas/coffees) still contain caffeine).  If you have caffeine within 12 hours prior, the test will need to be rescheduled.  Medication instructions:  Do not take nitrates (isosorbide mononitrate, Ranexa) the day before or day of test Hold theophylline containing medications for 12 hours. Hold Dipyridamole 48 hours prior to the test.  Diabetic Preparation: If able to eat breakfast prior to 3 hour fasting, you may take all medications, including your insulin. Do not worry if you miss your breakfast dose of insulin - start at your next meal. If you do not eat prior to 3 hour fast-Hold all diabetes (oral and insulin) medications. Patients who wear a continuous glucose monitor MUST remove the device prior to scanning.  You may take your remaining medications with water .  NO perfume, cologne or lotion on chest or abdomen area. FEMALES - Please avoid wearing dresses to this appointment.  Total time is 1 to 2 hours; you may want to bring reading material for the waiting time.  IF YOU THINK YOU MAY BE PREGNANT, OR ARE NURSING PLEASE INFORM THE TECHNOLOGIST.  In preparation for your appointment, medication and supplies will be purchased.  Appointment availability is limited, so if you need to cancel or reschedule, please call the Radiology Department Scheduler at [phone] 24 hours in advance to avoid a cancellation fee of $100.00  What to Expect When you Arrive:  Once you arrive and check in for your appointment, you will be taken to a preparation room within the Radiology Department.  A technologist or Nurse will obtain your medical history, verify that you are correctly prepped for the exam, and explain the procedure.  Afterwards, an IV will be started  in your arm and electrodes will be placed on your skin for EKG monitoring during the stress portion of the exam. Then you will be escorted to the PET/CT scanner.  There, staff will get you positioned on the scanner and obtain a blood pressure and EKG.  During the exam, you will continue to be connected to the EKG and blood pressure machines.  A small, safe amount of a radioactive tracer will be injected in your IV to obtain a series of pictures of your heart along with an injection of a stress agent.    After your Exam:  It is recommended that you eat a meal and drink a caffeinated beverage to counter act any effects of the stress agent.  Drink plenty of fluids for the remainder of the day and urinate frequently for the first couple of hours after the exam.  Your doctor will inform you of your test results within 7-10 business days.  For more information and frequently asked questions, please visit our website: [email or web address]  For questions about your test or how to prepare for your test, please call: Cardiac Imaging Nurse Navigators Office: [phone]   Follow-Up: At [facility], you and your health needs are our priority.  As part of our continuing mission to provide you with exceptional heart care, our providers are all part of one team.  This team includes your primary Cardiologist (physician) and Advanced Practice Providers or APPs (Physician Assistants and Nurse Practitioners) who all work together to provide you with the care you need, when you need it.  Your next appointment:   1 year(s)  Provider:   Dr. Wilbert Bihari, MD

## 2024-02-11 NOTE — Addendum Note (Signed)
 Addended by: JANIT GENI CROME on: 02/11/2024 09:54 AM   Modules accepted: Orders

## 2024-02-12 ENCOUNTER — Other Ambulatory Visit: Payer: Self-pay

## 2024-02-12 ENCOUNTER — Ambulatory Visit: Payer: Self-pay | Admitting: Cardiology

## 2024-02-12 DIAGNOSIS — M25661 Stiffness of right knee, not elsewhere classified: Secondary | ICD-10-CM | POA: Diagnosis not present

## 2024-02-12 DIAGNOSIS — M25561 Pain in right knee: Secondary | ICD-10-CM | POA: Diagnosis not present

## 2024-02-12 DIAGNOSIS — R931 Abnormal findings on diagnostic imaging of heart and coronary circulation: Secondary | ICD-10-CM

## 2024-02-16 DIAGNOSIS — M25561 Pain in right knee: Secondary | ICD-10-CM | POA: Diagnosis not present

## 2024-02-16 DIAGNOSIS — M25661 Stiffness of right knee, not elsewhere classified: Secondary | ICD-10-CM | POA: Diagnosis not present

## 2024-02-18 DIAGNOSIS — M25561 Pain in right knee: Secondary | ICD-10-CM | POA: Diagnosis not present

## 2024-02-18 DIAGNOSIS — M25661 Stiffness of right knee, not elsewhere classified: Secondary | ICD-10-CM | POA: Diagnosis not present

## 2024-02-22 DIAGNOSIS — K760 Fatty (change of) liver, not elsewhere classified: Secondary | ICD-10-CM | POA: Diagnosis not present

## 2024-02-23 ENCOUNTER — Encounter (HOSPITAL_COMMUNITY): Payer: Self-pay

## 2024-02-23 DIAGNOSIS — M25561 Pain in right knee: Secondary | ICD-10-CM | POA: Diagnosis not present

## 2024-02-23 DIAGNOSIS — M25661 Stiffness of right knee, not elsewhere classified: Secondary | ICD-10-CM | POA: Diagnosis not present

## 2024-02-24 ENCOUNTER — Ambulatory Visit (HOSPITAL_COMMUNITY): Admission: RE | Admit: 2024-02-24 | Source: Ambulatory Visit

## 2024-02-25 DIAGNOSIS — M25561 Pain in right knee: Secondary | ICD-10-CM | POA: Diagnosis not present

## 2024-02-25 DIAGNOSIS — M25661 Stiffness of right knee, not elsewhere classified: Secondary | ICD-10-CM | POA: Diagnosis not present

## 2024-02-27 ENCOUNTER — Other Ambulatory Visit: Payer: Self-pay | Admitting: Cardiology

## 2024-02-29 ENCOUNTER — Other Ambulatory Visit: Payer: Self-pay | Admitting: Cardiology

## 2024-03-02 DIAGNOSIS — N189 Chronic kidney disease, unspecified: Secondary | ICD-10-CM | POA: Diagnosis not present

## 2024-03-07 ENCOUNTER — Ambulatory Visit: Attending: Cardiology

## 2024-03-07 DIAGNOSIS — E7800 Pure hypercholesterolemia, unspecified: Secondary | ICD-10-CM | POA: Diagnosis not present

## 2024-03-07 NOTE — Patient Instructions (Addendum)
 Your Results:             Your most recent labs Goal  Total Cholesterol 161 < 200  Triglycerides 177 < 150  HDL (happy/good cholesterol) 41 > 40  LDL (lousy/bad cholesterol 90 < 70   Medication changes: Continue Repatha  140  mg every 14 days, fenofibrate  145 mg daily, and OTC fish oil 1000 mg once daily  Repeat lipid panel in 3 months (week of June 13, 2024)  Jaynell Castagnola E. Stephaie Dardis, Pharm.D [place] [facility]. [facility] [address] 5th Floor, [place], [place] [address] Phone: [phone]; Fax: [phone]    [facility] locations: [facility] - [address] First floor - [address] ([facility])  - [address] [phone]    [place]er, manufacturing systems office - [address] [facility] - [address]   [place]  -[address] Third floor ([facility]) [phone] [address]    [place] - [address]  - [address] [facility]  - [address] [place] - [address] ([facility])

## 2024-03-07 NOTE — Progress Notes (Signed)
 Patient ID: Ann Fernandez                 DOB: 11/14/1955                    MRN: [id number]     HPI: Ann Fernandez is a 68 y.o. female patient referred to lipid clinic by Dr. Shlomo. PMH is significant for HLD, coronary artery calcifications (coronary calcium  score of 248 in 12/2021), HTN, obesity and OSA.   Patient presents for PharmD lipid clinic today in good spirits. Patient states that she had a total knee replacement back in August 2025 and was instructed to hold 1 dose of Repatha  along with her GLP-1 RA before surgery. She states that after surgery she got off track with her injections and missed another dose of Repatha  resulting in missing 1 month of Repatha . Subsequently, a lipid panel was obtained shortly after which showed elevated LDL and triglycerides.  The patient stated that Repatha  has consistently helped her maintain lipid goals since initiating therapy over a year ago, which is supported by historical lab results. She has an intolerance to statins. She said she's tried several statins even at lower doses but is unable to tolerate due to leg pain. She confirmed that she has not had any interruptions in her other cholesterol medications as prescribed and has since started taking Repatha  consistently.   She reports her diet has improved greatly. She was recently diagnosed with fatty liver disease and meets with a nutritionist regularly. She also states her GLP-1 does a good job suppressing her appetite.   She states that she's had quite a few medications lately since being diagnosed with fatty liver disease. Her liver specialist started her on Rezdiffra. Additionally, her liver specialist advised her to she was switched from Ozempic  to Mounjaro . She is tolerating both medications well without any side effects.   Current Medications: Repatha  140 mg every 14 days, fenofibrate  145 mg daily, fish oil - omega 3 fatty acids 1000 mg once daily  Intolerances: atorvastatin and simvastatin  (leg pain), rosuvastatin  10 mg (leg pain)  Risk Factors: HTN, coronary artery calcifications (coronary calcium  score of 248 in 12/2021), family hx of heart disease LDL goal: < 70; Trig goal: < 150 Lipid panel (01/2024): Chol 161, Trig 177, HDL 41, LDL 90  01/2024: ALT 22, AST 20, Alk phos 48, GGT 37  Diet:  Lunch/Dinner: lean meat (fish, chicken, occasionally beef or pork) turnips; seldom fried food Drinks: water ; 40- 64 ounces per day Snack: fruit, peanuts, two triscuits with cheese  Exercise:  Limited due to knee; She engages in cardio and strength training a couple times per week through [employer]  Family History:   Relation Problem Comments  Mother (Deceased) Colon cancer   Colon polyps   Deep vein thrombosis   Heart disease   Lung disease   Rectal cancer (Age: 15)     Father (Deceased) COPD   Colon polyps   Heart disease   Osteoarthritis   Rheum arthritis     Brother (Deceased at age 41) Heart disease     Brother (Deceased at age 73) Pulmonary embolism     Maternal Grandmother (Deceased)   Maternal Grandfather (Deceased)   Paternal Grandmother (Deceased)   Paternal Grandfather (Deceased)   Daughter Rheum arthritis     Neg Hx Esophageal cancer   Stomach cancer      Social History:  Alcohol: none Smoking: none   Labs:  Lipid Panel     Component Value Date/Time   CHOL 161 02/11/2024 1044   TRIG 177 (H) 02/11/2024 1044   HDL 41 02/11/2024 1044   CHOLHDL 3.9 02/11/2024 1044   CHOLHDL 4.4 10/12/2015 1114   VLDL 44 (H) 10/12/2015 1114   LDLCALC 90 02/11/2024 1044   LDLDIRECT 172.8 06/29/2012 1003   LABVLDL 30 02/11/2024 1044    Past Medical History:  Diagnosis Date   Acute medial meniscal tear 05/25/2013   [place]at[place] coronary artery calcium  score between 200 and 399    Coronary calcium  score to 248 on 01/12/2022   Allergy    Anxiety    regarding  upcoming breast surgery   Arthritis, degenerative 09/26/2011   Atypical ductal  hyperplasia, 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 Breast, lumpectomy, Left - ATYPICAL DUCTAL HYPERPLASIA, SEE COMMENT. - SURGICAL MARGINS, NEGATIVE FOR ATYPIA OR MALIGNANCY. - PREVIOUS BIOPSY SITE IDENTIFIED. - ONE LYMPH NODE, NEGATIVE FOR TUMOR (0/1), 2. Breast, excision, Left medial margin - BENIGN BREAST TISSUE, SEE COMMENT. - NEGATIVE FOR ATYPIA OR MALIGNANCY.    DDD (degenerative disc disease)    Diabetes mellitus without complication (HCC)    DIVERTICULOSIS, COLON 12/21/2006   Qualifier: Diagnosis of  By: Tammie MD, Bruce     DOE (dyspnea on exertion) 01/20/2014   Encounter for long-term (current) use of other medications 09/26/2011   Essential hypertension 12/21/2006     Pt states she has a Ann Fernandez coat syndrome and her BP is OK at home.      Family history of adverse reaction to anesthesia    Anesthesia said they woke her mother up too fast and developed A-Fib .   GERD (gastroesophageal reflux disease)    Gout    Hearing loss    HYPERLIPIDEMIA 12/21/2006   Qualifier: Diagnosis of  By: Tammie MD, Bruce     IBS (irritable bowel syndrome)    Incarcerated incisional hernia s/p lap repair w mesh 10/02/2017 10/02/2017   Obesity (BMI 30-39.9) 06/27/2014   OSA (obstructive sleep apnea) 03/24/2014   Mild OSA with AHI 12.4/hr now on CPAP at 10cm H2O.  Persistent oxygen desats <88% for 11 minutes on CPAP and now on nighttime O2 at 2L via CPAP    PONV (postoperative nausea and vomiting)    Position carefully due to Ray-Cage back surgeries   Right ovarian cyst 09/12/2017   Wears glasses     Current Outpatient Medications on File Prior to Visit  Medication Sig Dispense Refill   fish oil-omega-3 fatty acids 1000 MG capsule Take 1 g by mouth 2 (two) times daily.  (Patient taking differently: Take 1 g by mouth daily.)     allopurinol (ZYLOPRIM) 100 MG tablet Take 2 tablets by mouth daily.     calcium -vitamin D  (OSCAL WITH D) 500-200 MG-UNIT per tablet Take 1 tablet by  mouth daily with breakfast.      fenofibrate  (TRICOR ) 145 MG tablet Take 1 tablet (145 mg total) by mouth daily. 90 tablet 3   fexofenadine (ALLEGRA) 180 MG tablet Take 180 mg by mouth every evening.     fluticasone  (FLONASE ) 50 MCG/ACT nasal spray as needed.     gabapentin  (NEURONTIN ) 300 MG capsule Take by mouth as needed.     hydrochlorothiazide  (HYDRODIURIL ) 25 MG tablet TAKE ONE TABLET BY MOUTH ONCE DAILY. 90 tablet 0   Magnesium  Oxide 400 MG CAPS Take 1 capsule (400 mg total) by mouth daily. 30 capsule 0   meclizine  (ANTIVERT )  25 MG tablet Take 25 mg by mouth 3 (three) times daily as needed for dizziness.      meloxicam (MOBIC) 15 MG tablet Take 15 mg by mouth daily.     metFORMIN  (GLUCOPHAGE ) 500 MG tablet Take 500 mg by mouth every evening.      methylcellulose oral powder Take 1 packet by mouth as needed.      mometasone  (NASONEX ) 50 MCG/ACT nasal spray Place 2 sprays into the nose at bedtime.      nebivolol  (BYSTOLIC ) 10 MG tablet TAKE (1) TABLET BY MOUTH ONCE DAILY. 90 tablet 3   olmesartan (BENICAR) 40 MG tablet Take 40 mg by mouth daily.     omeprazole (PRILOSEC) 20 MG capsule Take 20 mg by mouth daily as needed (for acid reflux).      REPATHA  SURECLICK 140 MG/ML SOAJ Inject 140 mg into the skin every 14 (fourteen) days. 2 mL 11   Resmetirom (REZDIFFRA) 60 MG TABS Take 1 tablet by mouth daily.     tirzepatide  (MOUNJARO ) 12.5 MG/0.5ML Pen Inject 12.5 mg into the skin once a week. 2 mL 0   No current facility-administered medications on file prior to visit.    Allergies  Allergen Reactions   Demerol  [Meperidine ] Other (See Comments)    AKA Demerol ; reaction: severe sweating, changes in blood pressure, whole body got red sweating, very hot AKA Demerol ; reaction: severe sweating, changes in blood pressure, whole body got red AKA Demerol ; reaction: severe sweating, changes in blood pressure, whole body got red sweating, very hot   Penicillins Hives and Other (See Comments)     As a child Has patient had a PCN reaction causing immediate rash, facial/tongue/throat swelling, SOB or lightheadedness with hypotension: Unknown Has patient had a PCN reaction causing severe rash involving mucus membranes or skin necrosis: Unknown Has patient had a PCN reaction that required hospitalization: Unknown Has patient had a PCN reaction occurring within the last 10 years: No If all of the above answers are NO, then may proceed with Cephalosporin use.  Tolerates Ancef   Sulfa Antibiotics Diarrhea and Other (See Comments)    Insomnia; extremely dry eyes   Sulfasalazine Diarrhea and Other (See Comments)    Insomnia; extremely dry eyes   Triamterene Hives   Amlodipine Besylate Other (See Comments)    REACTION: edema   Arimidex  [Anastrozole ] Other (See Comments)    Causing patient to go into Congestive Heart Failure   Lipitor [Atorvastatin] Other (See Comments)    Leg pain    Lisinopril Cough   Propoxyphene Other (See Comments)    Insomnia; extremely dry eyes   Simvastatin Other (See Comments)    REACTION: made legs hurt   Topiramate     Nose bleed    Assessment/Plan:  1. Hyperlipidemia -  Problem  Hyperlipidemia   Hyperlipidemia Assessment:  LDL goal: < 70 mg/dl; last LDLc 90 mg/dl; Trig goal: < 849; last Trig 177 mg/dl (0/7974)  Previous lipid panel (12/2022): LDL 47 mg/dl; Trig 872 mg/dl Tolerates current HLD regimen well without any side effects   Intolerance to atorvastatin, simvastatin and rosuvastatin  (even at lower doses) due to leg pains Most recent lipid panel likely due to interruption in Repatha  for 1 month Discussed importance of healthy lifestyle (diet and exercise) Discussed and provided Lowering LDL handout  Plan: Continue taking current medications (Repatha  140 mg every 14 days, fenofibrate  145 mg daily, fish oil - omega 3 fatty acids 1000 mg once daily) Lipid lab due in 3  months   Thank you,  Ranulfo Kall E. Drayce Tawil, Pharm.D [facility].  [facility] [address], [place], [place] [address] Phone: [phone]; Fax: [phone]

## 2024-03-07 NOTE — Assessment & Plan Note (Addendum)
 Assessment:  LDL goal: < 70 mg/dl; last LDLc 90 mg/dl; Trig goal: < 849; last Trig 177 mg/dl (0/7974)  Previous lipid panel (12/2022): LDL 47 mg/dl; Trig 872 mg/dl Tolerates current HLD regimen well without any side effects   Intolerance to atorvastatin, simvastatin and rosuvastatin  (even at lower doses) due to leg pains Most recent lipid panel likely due to interruption in Repatha  for 1 month Discussed importance of healthy lifestyle (diet and exercise) Discussed and provided Lowering LDL handout  Plan: Continue taking current medications (Repatha  140 mg every 14 days, fenofibrate  145 mg daily, fish oil - omega 3 fatty acids 1000 mg once daily) Lipid lab due in 3 months

## 2024-03-11 DIAGNOSIS — Z96651 Presence of right artificial knee joint: Secondary | ICD-10-CM | POA: Diagnosis not present

## 2024-03-11 DIAGNOSIS — R2241 Localized swelling, mass and lump, right lower limb: Secondary | ICD-10-CM | POA: Diagnosis not present

## 2024-03-11 DIAGNOSIS — Z471 Aftercare following joint replacement surgery: Secondary | ICD-10-CM | POA: Diagnosis not present

## 2024-03-12 ENCOUNTER — Other Ambulatory Visit: Payer: Self-pay | Admitting: Cardiology

## 2024-03-17 ENCOUNTER — Ambulatory Visit (HOSPITAL_COMMUNITY)
Admission: RE | Admit: 2024-03-17 | Discharge: 2024-03-17 | Disposition: A | Discharge status: Home / Self Care | Source: Ambulatory Visit | Attending: Internal Medicine | Admitting: Internal Medicine

## 2024-03-17 DIAGNOSIS — R011 Cardiac murmur, unspecified: Secondary | ICD-10-CM | POA: Diagnosis not present

## 2024-03-17 LAB — ECHOCARDIOGRAM COMPLETE
Area-P 1/2: 3.99 cm2
S' Lateral: 2.9 cm

## 2024-03-21 ENCOUNTER — Encounter (HOSPITAL_COMMUNITY): Payer: Self-pay

## 2024-03-21 DIAGNOSIS — R748 Abnormal levels of other serum enzymes: Secondary | ICD-10-CM | POA: Diagnosis not present

## 2024-03-21 DIAGNOSIS — K760 Fatty (change of) liver, not elsewhere classified: Secondary | ICD-10-CM | POA: Diagnosis not present

## 2024-03-22 ENCOUNTER — Telehealth (HOSPITAL_COMMUNITY): Payer: Self-pay | Admitting: Emergency Medicine

## 2024-03-22 NOTE — Telephone Encounter (Signed)
 Reaching out to patient to offer assistance regarding upcoming cardiac imaging study; pt verbalizes understanding of appt date/time, parking situation and where to check in, pre-test NPO status and medications ordered, and verified current allergies; name and call back number provided for further questions should they arise Rockwell Alexandria RN Navigator Cardiac Imaging [facility] [phone] office [phone] cell

## 2024-03-23 ENCOUNTER — Ambulatory Visit (HOSPITAL_COMMUNITY)
Admission: RE | Admit: 2024-03-23 | Discharge: 2024-03-23 | Disposition: A | Discharge status: Home / Self Care | Source: Ambulatory Visit | Attending: Cardiology | Admitting: Cardiology

## 2024-03-23 DIAGNOSIS — I2510 Atherosclerotic heart disease of native coronary artery without angina pectoris: Secondary | ICD-10-CM | POA: Diagnosis not present

## 2024-03-23 DIAGNOSIS — Z136 Encounter for screening for cardiovascular disorders: Secondary | ICD-10-CM | POA: Diagnosis not present

## 2024-03-23 DIAGNOSIS — R931 Abnormal findings on diagnostic imaging of heart and coronary circulation: Secondary | ICD-10-CM | POA: Diagnosis not present

## 2024-03-23 MED ORDER — RUBIDIUM RB82 GENERATOR (RUBYFILL)
25.8000 | PACK | Freq: Once | INTRAVENOUS | Status: AC
  Administered 2024-03-23: 25.81 via INTRAVENOUS

## 2024-03-23 MED ORDER — RUBIDIUM RB82 GENERATOR (RUBYFILL)
25.8600 | PACK | Freq: Once | INTRAVENOUS | Status: AC
  Administered 2024-03-23: 25.86 via INTRAVENOUS

## 2024-03-23 MED ORDER — REGADENOSON 0.4 MG/5ML IV SOLN
0.4000 mg | Freq: Once | INTRAVENOUS | Status: AC
  Administered 2024-03-23: 0.4 mg via INTRAVENOUS

## 2024-03-24 LAB — NM PET CT CARDIAC PERFUSION MULTI W/ABSOLUTE BLOODFLOW
MBFR: 2.32
Nuc Rest EF: 68 %
Nuc Stress EF: 72 %
Peak HR: 88 {beats}/min
Rest HR: 70 {beats}/min
Rest MBF: 1.02 ml/g/min
Rest Nuclear Isotope Dose: 25.9 mCi
ST Depression (mm): 0 mm
Stress MBF: 2.37 ml/g/min
Stress Nuclear Isotope Dose: 25.8 mCi
TID: 1.02

## 2024-03-31 ENCOUNTER — Other Ambulatory Visit: Payer: Self-pay | Admitting: Cardiology

## 2024-04-01 ENCOUNTER — Telehealth: Payer: Self-pay | Admitting: Cardiology

## 2024-04-01 MED ORDER — MOUNJARO 12.5 MG/0.5ML ~~LOC~~ SOAJ: 12.5000 mg | SUBCUTANEOUS | 0 refills | Status: DC

## 2024-04-01 NOTE — Telephone Encounter (Signed)
*  STAT* If patient is at the pharmacy, call can be transferred to refill team.   1. Which medications need to be refilled? (please list name of each medication and dose if known)   tirzepatide  (MOUNJARO ) 12.5 MG/0.5ML Pen    2. Which pharmacy/location (including street and city if local pharmacy) is medication to be sent to?  [facility] - [place], [place] - [id number]      3. Do they need a 30 day or 90 day supply? 90 day    Pt is out of medication

## 2024-04-19 DIAGNOSIS — I10 Essential (primary) hypertension: Secondary | ICD-10-CM | POA: Diagnosis not present

## 2024-04-19 DIAGNOSIS — G4733 Obstructive sleep apnea (adult) (pediatric): Secondary | ICD-10-CM | POA: Diagnosis not present

## 2024-04-24 ENCOUNTER — Other Ambulatory Visit: Payer: Self-pay | Admitting: Cardiology

## 2024-04-27 DIAGNOSIS — K760 Fatty (change of) liver, not elsewhere classified: Secondary | ICD-10-CM | POA: Diagnosis not present

## 2024-04-27 DIAGNOSIS — E669 Obesity, unspecified: Secondary | ICD-10-CM | POA: Diagnosis not present

## 2024-05-25 ENCOUNTER — Other Ambulatory Visit: Payer: Self-pay | Admitting: Cardiology

## 2024-06-15 ENCOUNTER — Telehealth: Payer: Self-pay

## 2024-06-15 NOTE — Telephone Encounter (Signed)
 My chart message sent to remind patient to obtain lipid panel.

## 2024-06-20 ENCOUNTER — Other Ambulatory Visit: Payer: Self-pay | Admitting: Medical Genetics

## 2024-06-27 ENCOUNTER — Ambulatory Visit

## 2024-06-27 VITALS — Ht 65.0 in | Wt 209.0 lb

## 2024-06-27 DIAGNOSIS — Z800 Family history of malignant neoplasm of digestive organs: Secondary | ICD-10-CM

## 2024-06-27 DIAGNOSIS — Z860100 Personal history of colon polyps, unspecified: Secondary | ICD-10-CM

## 2024-06-27 MED ORDER — NA SULFATE-K SULFATE-MG SULF 17.5-3.13-1.6 GM/177ML PO SOLN: 1.0000 | Freq: Once | ORAL | 0 refills | Status: AC

## 2024-06-27 NOTE — Progress Notes (Signed)
 PCP MD at time of PV: Hasanaj, MD __________________________________________________________________________________________________________________________________________  No egg allergy known to patient  No soy allergy known to patient No issues known to pt with past sedation with any surgeries or procedures Patient denies ever being told they had issues or difficulty with intubation  No FH of Malignant Hyperthermia Pt is not on diet pills Pt is not on  home 02  Pt is not on blood thinners  No A fib or A flutter Have any cardiac testing pending--no  LOA: independent  No Chew or Snuff tobacco __________________________________________________________________________________________________________________________________________  Constipation: drug induced with mounjaro   Prep: suprep  __________________________________________________________________________________________________________________________________________  PV completed with patient. Prep instructions reviewed and provided during apt. Rx sent to preferred pharmacy.  __________________________________________________________________________________________________________________________________________  Patient's chart reviewed by Norleen Schillings CNRA prior to previsit and patient appropriate for the [facility].  Previsit completed and red dot placed by patient's name on their procedure day (on provider's schedule).

## 2024-08-01 ENCOUNTER — Encounter: Admitting: Gastroenterology

## 2024-08-10 ENCOUNTER — Encounter: Admitting: Obstetrics and Gynecology
